# Patient Record
Sex: Female | Born: 1950
Health system: Southern US, Community
[De-identification: ages and names within clinical notes are randomized; demographics above are authoritative.]

## PROBLEM LIST (undated history)

## (undated) DIAGNOSIS — E7800 Pure hypercholesterolemia, unspecified: Secondary | ICD-10-CM

## (undated) DIAGNOSIS — G4730 Sleep apnea, unspecified: Secondary | ICD-10-CM

## (undated) DIAGNOSIS — Z803 Family history of malignant neoplasm of breast: Secondary | ICD-10-CM

## (undated) DIAGNOSIS — K2970 Gastritis, unspecified, without bleeding: Secondary | ICD-10-CM

## (undated) DIAGNOSIS — K209 Esophagitis, unspecified without bleeding: Secondary | ICD-10-CM

## (undated) DIAGNOSIS — K219 Gastro-esophageal reflux disease without esophagitis: Secondary | ICD-10-CM

## (undated) DIAGNOSIS — E559 Vitamin D deficiency, unspecified: Secondary | ICD-10-CM

## (undated) DIAGNOSIS — F32A Depression, unspecified: Secondary | ICD-10-CM

## (undated) DIAGNOSIS — Z800 Family history of malignant neoplasm of digestive organs: Secondary | ICD-10-CM

## (undated) DIAGNOSIS — K5909 Other constipation: Secondary | ICD-10-CM

## (undated) DIAGNOSIS — M1990 Unspecified osteoarthritis, unspecified site: Secondary | ICD-10-CM

## (undated) HISTORY — DX: Esophagitis, unspecified without bleeding: K20.90

## (undated) HISTORY — DX: Vitamin D deficiency, unspecified: E55.9

## (undated) HISTORY — DX: Family history of malignant neoplasm of digestive organs: Z80.0

## (undated) HISTORY — DX: Esophagitis, unspecified: K20.9

## (undated) HISTORY — DX: Unspecified osteoarthritis, unspecified site: M19.90

## (undated) HISTORY — DX: Gastritis, unspecified, without bleeding: K29.70

## (undated) HISTORY — DX: Family history of malignant neoplasm of breast: Z80.3

## (undated) HISTORY — DX: Gastro-esophageal reflux disease without esophagitis: K21.9

## (undated) HISTORY — DX: Sleep apnea, unspecified: G47.30

## (undated) HISTORY — DX: Pure hypercholesterolemia, unspecified: E78.00

## (undated) HISTORY — DX: Depression, unspecified: F32.A

## (undated) HISTORY — DX: Other constipation: K59.09

---

## 1974-03-28 HISTORY — PX: APPENDECTOMY: SHX54

## 1997-09-23 ENCOUNTER — Other Ambulatory Visit: Admission: RE | Admit: 1997-09-23 | Discharge: 1997-09-23 | Payer: Self-pay | Admitting: Obstetrics and Gynecology

## 1998-11-04 ENCOUNTER — Other Ambulatory Visit: Admission: RE | Admit: 1998-11-04 | Discharge: 1998-11-04 | Payer: Self-pay | Admitting: Obstetrics and Gynecology

## 1998-11-04 ENCOUNTER — Encounter (INDEPENDENT_AMBULATORY_CARE_PROVIDER_SITE_OTHER): Payer: Self-pay | Admitting: Specialist

## 1999-12-24 ENCOUNTER — Other Ambulatory Visit: Admission: RE | Admit: 1999-12-24 | Discharge: 1999-12-24 | Payer: Self-pay | Admitting: Otolaryngology

## 2001-03-19 ENCOUNTER — Other Ambulatory Visit: Admission: RE | Admit: 2001-03-19 | Discharge: 2001-03-19 | Payer: Self-pay | Admitting: Obstetrics and Gynecology

## 2002-03-29 ENCOUNTER — Other Ambulatory Visit: Admission: RE | Admit: 2002-03-29 | Discharge: 2002-03-29 | Payer: Self-pay | Admitting: Obstetrics and Gynecology

## 2003-05-07 ENCOUNTER — Other Ambulatory Visit: Admission: RE | Admit: 2003-05-07 | Discharge: 2003-05-07 | Payer: Self-pay | Admitting: Obstetrics and Gynecology

## 2003-10-29 ENCOUNTER — Other Ambulatory Visit: Payer: Self-pay

## 2004-07-24 ENCOUNTER — Ambulatory Visit: Payer: Self-pay | Admitting: Unknown Physician Specialty

## 2004-08-20 ENCOUNTER — Other Ambulatory Visit: Admission: RE | Admit: 2004-08-20 | Discharge: 2004-08-20 | Payer: Self-pay | Admitting: Obstetrics and Gynecology

## 2005-08-24 ENCOUNTER — Other Ambulatory Visit: Admission: RE | Admit: 2005-08-24 | Discharge: 2005-08-24 | Payer: Self-pay | Admitting: Obstetrics & Gynecology

## 2005-12-14 ENCOUNTER — Other Ambulatory Visit: Admission: RE | Admit: 2005-12-14 | Discharge: 2005-12-14 | Payer: Self-pay | Admitting: Obstetrics & Gynecology

## 2006-04-19 ENCOUNTER — Other Ambulatory Visit: Admission: RE | Admit: 2006-04-19 | Discharge: 2006-04-19 | Payer: Self-pay | Admitting: Obstetrics & Gynecology

## 2006-07-20 ENCOUNTER — Other Ambulatory Visit: Admission: RE | Admit: 2006-07-20 | Discharge: 2006-07-20 | Payer: Self-pay | Admitting: Obstetrics & Gynecology

## 2006-12-26 ENCOUNTER — Ambulatory Visit: Payer: Self-pay | Admitting: Rheumatology

## 2007-07-23 ENCOUNTER — Other Ambulatory Visit: Admission: RE | Admit: 2007-07-23 | Discharge: 2007-07-23 | Payer: Self-pay | Admitting: Obstetrics and Gynecology

## 2008-07-25 ENCOUNTER — Other Ambulatory Visit: Admission: RE | Admit: 2008-07-25 | Discharge: 2008-07-25 | Payer: Self-pay | Admitting: Obstetrics and Gynecology

## 2009-02-13 ENCOUNTER — Ambulatory Visit: Payer: Self-pay | Admitting: Unknown Physician Specialty

## 2009-02-13 LAB — HM COLONOSCOPY

## 2010-08-10 ENCOUNTER — Emergency Department (HOSPITAL_COMMUNITY)
Admission: EM | Admit: 2010-08-10 | Discharge: 2010-08-10 | Disposition: A | Discharge status: Home / Self Care | Payer: BC Managed Care – PPO | Attending: Emergency Medicine | Admitting: Emergency Medicine

## 2010-08-10 DIAGNOSIS — E785 Hyperlipidemia, unspecified: Secondary | ICD-10-CM | POA: Insufficient documentation

## 2010-08-10 DIAGNOSIS — R55 Syncope and collapse: Secondary | ICD-10-CM | POA: Insufficient documentation

## 2010-08-10 DIAGNOSIS — R1032 Left lower quadrant pain: Secondary | ICD-10-CM | POA: Insufficient documentation

## 2010-08-10 DIAGNOSIS — R112 Nausea with vomiting, unspecified: Secondary | ICD-10-CM | POA: Insufficient documentation

## 2010-08-10 LAB — CBC
HCT: 37.1 % (ref 36.0–46.0)
Hemoglobin: 12.5 g/dL (ref 12.0–15.0)
MCH: 29.2 pg (ref 26.0–34.0)
MCHC: 33.7 g/dL (ref 30.0–36.0)
MCV: 86.7 fL (ref 78.0–100.0)
Platelets: 242 10*3/uL (ref 150–400)
RBC: 4.28 MIL/uL (ref 3.87–5.11)
RDW: 13.3 % (ref 11.5–15.5)
WBC: 11.7 10*3/uL — ABNORMAL HIGH (ref 4.0–10.5)

## 2010-08-10 LAB — DIFFERENTIAL
Basophils Absolute: 0 10*3/uL (ref 0.0–0.1)
Basophils Relative: 0 % (ref 0–1)
Eosinophils Absolute: 0.1 10*3/uL (ref 0.0–0.7)
Eosinophils Relative: 1 % (ref 0–5)
Lymphocytes Relative: 15 % (ref 12–46)
Lymphs Abs: 1.8 10*3/uL (ref 0.7–4.0)
Monocytes Absolute: 0.7 10*3/uL (ref 0.1–1.0)
Monocytes Relative: 6 % (ref 3–12)
Neutro Abs: 9.1 10*3/uL — ABNORMAL HIGH (ref 1.7–7.7)
Neutrophils Relative %: 78 % — ABNORMAL HIGH (ref 43–77)

## 2010-08-10 LAB — URINALYSIS, ROUTINE W REFLEX MICROSCOPIC
Bilirubin Urine: NEGATIVE
Glucose, UA: NEGATIVE mg/dL
Ketones, ur: NEGATIVE mg/dL
Nitrite: NEGATIVE
Protein, ur: NEGATIVE mg/dL
Specific Gravity, Urine: 1.005 (ref 1.005–1.030)
Urobilinogen, UA: 0.2 mg/dL (ref 0.0–1.0)
pH: 6.5 (ref 5.0–8.0)

## 2010-08-10 LAB — COMPREHENSIVE METABOLIC PANEL
ALT: 25 U/L (ref 0–35)
AST: 24 U/L (ref 0–37)
Albumin: 4.1 g/dL (ref 3.5–5.2)
Alkaline Phosphatase: 46 U/L (ref 39–117)
BUN: 19 mg/dL (ref 6–23)
CO2: 26 mEq/L (ref 19–32)
Calcium: 10 mg/dL (ref 8.4–10.5)
Chloride: 105 mEq/L (ref 96–112)
Creatinine, Ser: 0.78 mg/dL (ref 0.4–1.2)
GFR calc Af Amer: >60 mL/min (ref >60)
GFR calc non Af Amer: >60 mL/min (ref >60)
Glucose, Bld: 115 mg/dL — ABNORMAL HIGH (ref 70–99)
Potassium: 4.1 mEq/L (ref 3.5–5.1)
Sodium: 140 mEq/L (ref 135–145)
Total Bilirubin: 0.3 mg/dL (ref 0.3–1.2)
Total Protein: 7 g/dL (ref 6.0–8.3)

## 2010-08-10 LAB — POCT CARDIAC MARKERS
CKMB, poc: <1 ng/mL — ABNORMAL LOW (ref 1.0–8.0)
Myoglobin, poc: 77.1 ng/mL (ref 12–200)
Troponin i, poc: <0.05 ng/mL (ref 0.00–0.09)

## 2010-08-10 LAB — URINE MICROSCOPIC-ADD ON

## 2010-08-10 LAB — GLUCOSE, CAPILLARY: Glucose-Capillary: 123 mg/dL — ABNORMAL HIGH (ref 70–99)

## 2010-08-18 ENCOUNTER — Ambulatory Visit: Payer: Self-pay | Admitting: Internal Medicine

## 2010-08-18 LAB — URINE CULTURE
Colony Count: 5000
Culture  Setup Time: 201205161808

## 2011-02-08 ENCOUNTER — Ambulatory Visit: Payer: Self-pay | Admitting: Internal Medicine

## 2011-05-14 ENCOUNTER — Emergency Department: Payer: Self-pay | Admitting: Emergency Medicine

## 2011-05-14 LAB — COMPREHENSIVE METABOLIC PANEL
Albumin: 4.9 g/dL (ref 3.4–5.0)
Alkaline Phosphatase: 44 U/L — ABNORMAL LOW (ref 50–136)
Anion Gap: 14 (ref 7–16)
BUN: 20 mg/dL — ABNORMAL HIGH (ref 7–18)
Bilirubin,Total: 0.3 mg/dL (ref 0.2–1.0)
Calcium, Total: 9.7 mg/dL (ref 8.5–10.1)
Chloride: 104 mmol/L (ref 98–107)
Co2: 22 mmol/L (ref 21–32)
Creatinine: 0.97 mg/dL (ref 0.60–1.30)
EGFR (African American): >60
EGFR (Non-African Amer.): >60
Glucose: 141 mg/dL — ABNORMAL HIGH (ref 65–99)
Osmolality: 284 (ref 275–301)
Potassium: 3.8 mmol/L (ref 3.5–5.1)
SGOT(AST): 42 U/L — ABNORMAL HIGH (ref 15–37)
SGPT (ALT): 48 U/L
Sodium: 140 mmol/L (ref 136–145)
Total Protein: 8.2 g/dL (ref 6.4–8.2)

## 2011-05-14 LAB — CBC WITH DIFFERENTIAL/PLATELET
Basophil #: 0 10*3/uL (ref 0.0–0.1)
Basophil %: 0.1 %
Eosinophil #: 0.1 10*3/uL (ref 0.0–0.7)
Eosinophil %: 0.5 %
HCT: 42.2 % (ref 35.0–47.0)
HGB: 14.2 g/dL (ref 12.0–16.0)
Lymphocyte #: 2.7 10*3/uL (ref 1.0–3.6)
Lymphocyte %: 15.9 %
MCH: 30 pg (ref 26.0–34.0)
MCHC: 33.5 g/dL (ref 32.0–36.0)
MCV: 90 fL (ref 80–100)
Monocyte #: 0.3 10*3/uL (ref 0.0–0.7)
Monocyte %: 1.7 %
Neutrophil #: 13.9 10*3/uL — ABNORMAL HIGH (ref 1.4–6.5)
Neutrophil %: 81.8 %
Platelet: 235 10*3/uL (ref 150–440)
RBC: 4.71 10*6/uL (ref 3.80–5.20)
RDW: 13.2 % (ref 11.5–14.5)
WBC: 17 10*3/uL — ABNORMAL HIGH (ref 3.6–11.0)

## 2011-05-14 LAB — TROPONIN I: Troponin-I: <0.02 ng/mL

## 2011-09-14 ENCOUNTER — Ambulatory Visit: Payer: Self-pay | Admitting: Internal Medicine

## 2011-11-23 ENCOUNTER — Ambulatory Visit: Payer: Self-pay | Admitting: Unknown Physician Specialty

## 2011-11-23 LAB — CREATININE, SERUM
Creatinine: 0.73 mg/dL (ref 0.60–1.30)
EGFR (African American): >60
EGFR (Non-African Amer.): >60

## 2011-11-30 ENCOUNTER — Ambulatory Visit: Payer: Self-pay | Admitting: Unknown Physician Specialty

## 2011-12-13 ENCOUNTER — Ambulatory Visit: Payer: Self-pay | Admitting: Unknown Physician Specialty

## 2011-12-14 LAB — PATHOLOGY REPORT

## 2011-12-29 ENCOUNTER — Ambulatory Visit: Payer: Self-pay | Admitting: Surgery

## 2011-12-30 LAB — PATHOLOGY REPORT

## 2012-03-01 ENCOUNTER — Other Ambulatory Visit: Payer: Self-pay | Admitting: Internal Medicine

## 2012-03-01 NOTE — Telephone Encounter (Signed)
Pt is needing her Serteraline and she uses [facility].

## 2012-03-01 NOTE — Telephone Encounter (Signed)
She has an appt with me in 3/14.  I am ok to refill her sertraline until her appt , but will need to clarify dose with pt before filling.  I do not have her records here yet.

## 2012-03-02 MED ORDER — SERTRALINE HCL 100 MG PO TABS: 100.0000 mg | ORAL_TABLET | Freq: Every day | ORAL | Status: DC

## 2012-03-02 NOTE — Telephone Encounter (Signed)
Called prescription in to pharmacy 

## 2012-03-15 ENCOUNTER — Ambulatory Visit: Payer: Self-pay | Admitting: Internal Medicine

## 2012-03-19 ENCOUNTER — Encounter: Payer: Self-pay | Admitting: Internal Medicine

## 2012-03-19 DIAGNOSIS — E7800 Pure hypercholesterolemia, unspecified: Secondary | ICD-10-CM

## 2012-03-19 DIAGNOSIS — K219 Gastro-esophageal reflux disease without esophagitis: Secondary | ICD-10-CM

## 2012-04-04 ENCOUNTER — Encounter: Payer: Self-pay | Admitting: Internal Medicine

## 2012-06-03 ENCOUNTER — Encounter: Payer: Self-pay | Admitting: Internal Medicine

## 2012-06-03 DIAGNOSIS — E7800 Pure hypercholesterolemia, unspecified: Secondary | ICD-10-CM | POA: Insufficient documentation

## 2012-06-03 DIAGNOSIS — K219 Gastro-esophageal reflux disease without esophagitis: Secondary | ICD-10-CM | POA: Insufficient documentation

## 2012-06-04 ENCOUNTER — Ambulatory Visit (INDEPENDENT_AMBULATORY_CARE_PROVIDER_SITE_OTHER): Payer: BC Managed Care – PPO | Admitting: Internal Medicine

## 2012-06-04 ENCOUNTER — Encounter: Payer: Self-pay | Admitting: Internal Medicine

## 2012-06-04 ENCOUNTER — Other Ambulatory Visit (HOSPITAL_COMMUNITY)
Admission: RE | Admit: 2012-06-04 | Discharge: 2012-06-04 | Disposition: A | Discharge status: Home / Self Care | Payer: BC Managed Care – PPO | Source: Ambulatory Visit | Attending: Internal Medicine | Admitting: Internal Medicine

## 2012-06-04 VITALS — BP 110/60 | HR 75 | Temp 98.4°F | Ht 60.15 in | Wt 139.0 lb

## 2012-06-04 DIAGNOSIS — Z01419 Encounter for gynecological examination (general) (routine) without abnormal findings: Secondary | ICD-10-CM | POA: Insufficient documentation

## 2012-06-04 DIAGNOSIS — Z124 Encounter for screening for malignant neoplasm of cervix: Secondary | ICD-10-CM

## 2012-06-04 DIAGNOSIS — Z1151 Encounter for screening for human papillomavirus (HPV): Secondary | ICD-10-CM | POA: Insufficient documentation

## 2012-06-04 DIAGNOSIS — K209 Esophagitis, unspecified without bleeding: Secondary | ICD-10-CM

## 2012-06-04 DIAGNOSIS — K219 Gastro-esophageal reflux disease without esophagitis: Secondary | ICD-10-CM

## 2012-06-04 DIAGNOSIS — E7800 Pure hypercholesterolemia, unspecified: Secondary | ICD-10-CM

## 2012-06-04 DIAGNOSIS — G4733 Obstructive sleep apnea (adult) (pediatric): Secondary | ICD-10-CM

## 2012-06-04 LAB — HM PAP SMEAR: HM Pap smear: NEGATIVE

## 2012-06-05 ENCOUNTER — Encounter: Payer: Self-pay | Admitting: Internal Medicine

## 2012-06-05 DIAGNOSIS — K209 Esophagitis, unspecified without bleeding: Secondary | ICD-10-CM | POA: Insufficient documentation

## 2012-06-05 DIAGNOSIS — G4733 Obstructive sleep apnea (adult) (pediatric): Secondary | ICD-10-CM | POA: Insufficient documentation

## 2012-06-05 NOTE — Assessment & Plan Note (Signed)
On crestor.  Low cholesterol diet and exercise.  Check lipid panel and liver function.  

## 2012-06-05 NOTE — Assessment & Plan Note (Signed)
Using CPAP.  Doing well.  Follow.   

## 2012-06-05 NOTE — Progress Notes (Signed)
Subjective:    Patient ID: Jessica Peck, female    DOB: 04-12-1950, 62 y.o.   MRN: [id number]  HPI 62 year old female with past history of hypercholesterolemia and esophagitis who comes in today for a scheduled follow up and to transfer her care here to [facility].  She is also here for a complete physical exam.  She states she has been doing better.  Using CPAP now and doing well with this.  Had her gallbladder removed - 10/13.  No abdominal pain or cramping.  On prevacid and this is controlling acid reflux.  Bowels stable.  Has changed jobs.  Handling stress well.  Overall she feels good and feels she is doing well.  Bowels stable.   Past Medical History  Diagnosis Date  . Hypercholesterolemia   . Esophagitis   . Gastritis     EGD (hiatal hernia)  . Chronic constipation   . GERD (gastroesophageal reflux disease)   . Vitamin D deficiency   . Sleep apnea     CPAP    Current Outpatient Prescriptions on File Prior to Visit  Medication Sig Dispense Refill  . lansoprazole (PREVACID) 30 MG capsule Take 30 mg by mouth daily.      . rosuvastatin (CRESTOR) 5 MG tablet 1/2 tablet q day      . sertraline (ZOLOFT) 100 MG tablet Take 1 tablet (100 mg total) by mouth daily.  90 tablet  0   No current facility-administered medications on file prior to visit.    Review of Systems Patient denies any headache, lightheadedness or dizziness.  No sinus or allergy symptoms.  No chest pain, tightness or palpitations.  No increased shortness of breath, cough or congestion.  No nausea or vomiting. Acid reflux controlled with prevacid.  No abdominal pain or cramping.  No bowel change, such as diarrhea, constipation, BRBPR or melana.  No urine change.  Handling stress well.  Feels good.  Has a nasal lesion.      Objective:   Physical Exam Filed Vitals:   06/04/12 1002  BP: 110/60  Pulse: 75  Temp: 98.4 F (36.9 C)   Pulse: 59  62 year old female in no acute distress.   HEENT:  Nares- clear.   Oropharynx - without lesions. NECK:  Supple.  Nontender.  No audible bruit.  HEART:  Appears to be regular. LUNGS:  No crackles or wheezing audible.  Respirations even and unlabored.  RADIAL PULSE:  Equal bilaterally.    BREASTS:  No nipple discharge or nipple retraction present.  Could not appreciate any distinct nodules or axillary adenopathy.  ABDOMEN:  Soft, nontender.  Bowel sounds present and normal.  No audible abdominal bruit.  GU:  Normal external genitalia.  Vaginal vault without lesions.  Cervix identified.  Pap performed. Could not appreciate any adnexal masses or tenderness.   RECTAL:  Heme negative.   EXTREMITIES:  No increased edema present.  DP pulses palpable and equal bilaterally.          Assessment & Plan:  NASAL LESION.  Could not appreciate a significant lesion today.  Bactroban cream as directed.  Follow.    CARDIOVASCULAR.  Had a stress myoview 12/01/09.  ER 68% with no ischemia.  ECHO - EF 55% with mild AI.  Currently asymptomatic.  Continue risk factor modification.   INCREASED PSYCHOSOCIAL STRESSORS.  Doing well on Zoloft.  Follow.   PULMONARY.  CXR 06/21/10 - no acute cardiopulmonary disease.  Asymptomatic.  Using CPAP.   HEALTH  MAINTENANCE.  Physical today.  Pap today.  Obtain mammogram results.  Colonoscopy 02/13/09 - internal hemorrhoids.

## 2012-06-05 NOTE — Assessment & Plan Note (Signed)
Symptoms controlled on prevacid.  Follow.   

## 2012-06-05 NOTE — Assessment & Plan Note (Signed)
Noted on EGD.  On prevacid.  Symptoms controlled.   

## 2012-06-06 ENCOUNTER — Encounter: Payer: Self-pay | Admitting: Internal Medicine

## 2012-07-02 ENCOUNTER — Other Ambulatory Visit: Payer: Self-pay | Admitting: *Deleted

## 2012-07-02 MED ORDER — SERTRALINE HCL 100 MG PO TABS: 100.0000 mg | ORAL_TABLET | Freq: Every day | ORAL | Status: DC

## 2012-07-05 NOTE — Telephone Encounter (Signed)
Please close encounter

## 2012-11-28 ENCOUNTER — Other Ambulatory Visit (INDEPENDENT_AMBULATORY_CARE_PROVIDER_SITE_OTHER): Payer: BC Managed Care – PPO

## 2012-11-28 DIAGNOSIS — G4733 Obstructive sleep apnea (adult) (pediatric): Secondary | ICD-10-CM

## 2012-11-28 DIAGNOSIS — E7800 Pure hypercholesterolemia, unspecified: Secondary | ICD-10-CM

## 2012-11-28 LAB — LIPID PANEL
Cholesterol: 213 mg/dL — ABNORMAL HIGH (ref 0–200)
HDL: 58.6 mg/dL (ref >39.00)
Total CHOL/HDL Ratio: 4
Triglycerides: 190 mg/dL — ABNORMAL HIGH (ref 0.0–149.0)
VLDL: 38 mg/dL (ref 0.0–40.0)

## 2012-11-28 LAB — CBC WITH DIFFERENTIAL/PLATELET
Basophils Absolute: 0 10*3/uL (ref 0.0–0.1)
Basophils Relative: 0.6 % (ref 0.0–3.0)
Eosinophils Absolute: 0.2 10*3/uL (ref 0.0–0.7)
Eosinophils Relative: 3.9 % (ref 0.0–5.0)
HCT: 37.2 % (ref 36.0–46.0)
Hemoglobin: 12.8 g/dL (ref 12.0–15.0)
Lymphocytes Relative: 48.9 % — ABNORMAL HIGH (ref 12.0–46.0)
Lymphs Abs: 2.5 10*3/uL (ref 0.7–4.0)
MCHC: 34.3 g/dL (ref 30.0–36.0)
MCV: 88.2 fl (ref 78.0–100.0)
Monocytes Absolute: 0.2 10*3/uL (ref 0.1–1.0)
Monocytes Relative: 4.7 % (ref 3.0–12.0)
Neutro Abs: 2.1 10*3/uL (ref 1.4–7.7)
Neutrophils Relative %: 41.9 % — ABNORMAL LOW (ref 43.0–77.0)
Platelets: 239 10*3/uL (ref 150.0–400.0)
RBC: 4.22 Mil/uL (ref 3.87–5.11)
RDW: 13.7 % (ref 11.5–14.6)
WBC: 5.1 10*3/uL (ref 4.5–10.5)

## 2012-11-28 LAB — COMPREHENSIVE METABOLIC PANEL
ALT: 22 U/L (ref 0–35)
AST: 23 U/L (ref 0–37)
Albumin: 4.4 g/dL (ref 3.5–5.2)
Alkaline Phosphatase: 40 U/L (ref 39–117)
BUN: 18 mg/dL (ref 6–23)
CO2: 24 mEq/L (ref 19–32)
Calcium: 9.4 mg/dL (ref 8.4–10.5)
Chloride: 108 mEq/L (ref 96–112)
Creatinine, Ser: 0.8 mg/dL (ref 0.4–1.2)
GFR: 74.32 mL/min (ref >60.00)
Glucose, Bld: 110 mg/dL — ABNORMAL HIGH (ref 70–99)
Potassium: 4.3 mEq/L (ref 3.5–5.1)
Sodium: 138 mEq/L (ref 135–145)
Total Bilirubin: 0.5 mg/dL (ref 0.3–1.2)
Total Protein: 7 g/dL (ref 6.0–8.3)

## 2012-11-28 LAB — LDL CHOLESTEROL, DIRECT: Direct LDL: 135.3 mg/dL

## 2012-11-28 LAB — TSH: TSH: 2.56 u[IU]/mL (ref 0.35–5.50)

## 2012-12-07 ENCOUNTER — Ambulatory Visit: Payer: BC Managed Care – PPO | Admitting: Internal Medicine

## 2012-12-10 ENCOUNTER — Encounter: Payer: Self-pay | Admitting: Internal Medicine

## 2012-12-10 ENCOUNTER — Ambulatory Visit (INDEPENDENT_AMBULATORY_CARE_PROVIDER_SITE_OTHER): Payer: BC Managed Care – PPO | Admitting: Internal Medicine

## 2012-12-10 VITALS — BP 120/80 | HR 73 | Temp 98.5°F | Ht 60.15 in | Wt 142.2 lb

## 2012-12-10 DIAGNOSIS — E7800 Pure hypercholesterolemia, unspecified: Secondary | ICD-10-CM

## 2012-12-10 DIAGNOSIS — K209 Esophagitis, unspecified without bleeding: Secondary | ICD-10-CM

## 2012-12-10 DIAGNOSIS — K219 Gastro-esophageal reflux disease without esophagitis: Secondary | ICD-10-CM

## 2012-12-10 DIAGNOSIS — G4733 Obstructive sleep apnea (adult) (pediatric): Secondary | ICD-10-CM

## 2012-12-10 DIAGNOSIS — Z0000 Encounter for general adult medical examination without abnormal findings: Secondary | ICD-10-CM

## 2012-12-10 MED ORDER — ESTROGENS, CONJUGATED 0.625 MG/GM VA CREA: TOPICAL_CREAM | VAGINAL | Status: DC | PRN

## 2012-12-10 NOTE — Patient Instructions (Addendum)
Ranitidine 150mg  - take before bed.    Jessica Peck - [phone]

## 2012-12-11 ENCOUNTER — Encounter: Payer: Self-pay | Admitting: Internal Medicine

## 2012-12-11 NOTE — Assessment & Plan Note (Signed)
Using CPAP.  Doing well.  Follow.   

## 2012-12-11 NOTE — Progress Notes (Signed)
  Subjective:    Patient ID: Jessica Peck, female    DOB: 22-Feb-1951, 62 y.o.   MRN: [id number]  HPI 62 year old female with past history of hypercholesterolemia and esophagitis who comes in today for a scheduled follow up.  She states she has been doing better.   Had her gallbladder removed - 10/13.  No abdominal pain or cramping.  On prevacid and this is controlling acid reflux reasonably well.  Minimal symptoms.  Bowels stable.  Has changed jobs.  Handling stress well.  Overall she feels good and feels she is doing well.  She did report she may want to pursue some counseling in the future.  Bowels stable.  She did stop her crestor two weeks ago.  Had no problems with the medication, just did not want to take it.  Wants to try diet and exercise.    Past Medical History  Diagnosis Date  . Hypercholesterolemia   . Esophagitis   . Gastritis     EGD (hiatal hernia)  . Chronic constipation   . GERD (gastroesophageal reflux disease)   . Vitamin D deficiency   . Sleep apnea     CPAP    Current Outpatient Prescriptions on File Prior to Visit  Medication Sig Dispense Refill  . lansoprazole (PREVACID) 30 MG capsule Take 30 mg by mouth daily.      . sertraline (ZOLOFT) 100 MG tablet Take 1 tablet (100 mg total) by mouth daily.  30 tablet  3  . Vitamin D, Ergocalciferol, (DRISDOL) 50000 UNITS CAPS Take 50,000 Units by mouth daily.       No current facility-administered medications on file prior to visit.    Review of Systems Patient denies any headache, lightheadedness or dizziness.  No sinus or allergy symptoms.  No chest pain, tightness or palpitations.  No increased shortness of breath, cough or congestion.  No nausea or vomiting. Acid reflux controlled reasonably well with prevacid.  Minimal symptoms at times.  No abdominal pain or cramping.  No bowel change, such as diarrhea, constipation, BRBPR or melana.  No urine change.  Handling stress well.  Feels good.  Off crestor.        Objective:   Physical Exam  Filed Vitals:   12/10/12 0935  BP: 120/80  Pulse: 73  Temp: 98.5 F (36.9 C)   Pulse: 15  62 year old female in no acute distress.   HEENT:  Nares- clear.  Oropharynx - without lesions. NECK:  Supple.  Nontender.  No audible bruit.  HEART:  Appears to be regular. LUNGS:  No crackles or wheezing audible.  Respirations even and unlabored.  RADIAL PULSE:  Equal bilaterally.   ABDOMEN:  Soft, nontender.  Bowel sounds present and normal.  No audible abdominal bruit.    EXTREMITIES:  No increased edema present.  DP pulses palpable and equal bilaterally.          Assessment & Plan:   CARDIOVASCULAR.  Had a stress myoview 12/01/09.  ER 68% with no ischemia.  ECHO - EF 55% with mild AI.  Currently asymptomatic.  Continue risk factor modification.   INCREASED PSYCHOSOCIAL STRESSORS.  Doing well on Zoloft.  Follow.  Did give her [facility] phone number if desires counseling.    PULMONARY.  CXR 06/21/10 - no acute cardiopulmonary disease.  Asymptomatic.  Using CPAP.   HEALTH MAINTENANCE.  Physical 06/04/12.  Pap - ok.  Mammogram 03/15/12 - Birads II. Colonoscopy 02/13/09 - internal hemorrhoids.

## 2012-12-11 NOTE — Assessment & Plan Note (Signed)
Noted on EGD.  On prevacid.  Symptoms controlled.   

## 2012-12-11 NOTE — Assessment & Plan Note (Signed)
Off crestor.  Low cholesterol diet and exercise.  Lipid panel just checked and revealed slightly increased triglycerides.  [place] diet given.  Wants to remain off crestor.  Wants to work on diet and exercise.  Will follow.

## 2012-12-11 NOTE — Assessment & Plan Note (Addendum)
Symptoms relatively well controlled on prevacid.  Some minimal symptoms at times.  Add Zantac before her evening meal.  Follow.

## 2012-12-24 ENCOUNTER — Encounter: Payer: Self-pay | Admitting: Internal Medicine

## 2012-12-26 ENCOUNTER — Encounter: Payer: Self-pay | Admitting: Internal Medicine

## 2013-02-20 ENCOUNTER — Encounter (INDEPENDENT_AMBULATORY_CARE_PROVIDER_SITE_OTHER): Payer: Self-pay

## 2013-02-20 ENCOUNTER — Ambulatory Visit (INDEPENDENT_AMBULATORY_CARE_PROVIDER_SITE_OTHER): Payer: BC Managed Care – PPO | Admitting: Adult Health

## 2013-02-20 ENCOUNTER — Encounter: Payer: Self-pay | Admitting: Adult Health

## 2013-02-20 VITALS — BP 128/80 | HR 77 | Temp 97.8°F | Resp 14 | Wt 144.0 lb

## 2013-02-20 DIAGNOSIS — S060X0D Concussion without loss of consciousness, subsequent encounter: Secondary | ICD-10-CM

## 2013-02-20 DIAGNOSIS — S060X0A Concussion without loss of consciousness, initial encounter: Secondary | ICD-10-CM

## 2013-02-20 NOTE — Assessment & Plan Note (Signed)
Followup visit following a fall 2 weeks ago with concussion without loss of consciousness. 2 staples required. CT done normal reported by pt. Normal exam. Symptoms expected following concussion. If visual disturbance, severe headache or other neuro deficits develop then patient was instructed to call immediately. Follow up with PCP in 1 week.

## 2013-02-20 NOTE — Progress Notes (Signed)
Pre visit review using our clinic review tool, if applicable. No additional management support is needed unless otherwise documented below in the visit note. 

## 2013-02-20 NOTE — Progress Notes (Signed)
   Subjective:    Patient ID: Jessica Peck, female    DOB: Jul 13, 1950, 62 y.o.   MRN: [id number]  HPI  Is a very pleasant 62 year old female who presents to clinic status post fall 2 weeks ago. She reports that she slipped on a hardwood floor and fell and hit the back of her head. She required 2 staples. The staples have been removed. She was instructed to take Tylenol or ibuprofen. Instructed not to take aspirin. She was also instructed to rest and not do any strenuous activity. Patient reports that she was helping to move heavy furniture the Monday after the fall. Patient reports that she still has "weird" sensations in her head. Denies severe headache, blurred vision, dizziness. She reports feeling lethargic at times. Patient was given instructions in the hospital that depicted what to expect following a concussion. She is aware that she could have mild headaches, memory changes lasting approximately a couple of months.  Current Outpatient Prescriptions on File Prior to Visit  Medication Sig Dispense Refill  . conjugated estrogens (PREMARIN) vaginal cream Place vaginally as needed.  42.5 g  2  . lansoprazole (PREVACID) 30 MG capsule Take 30 mg by mouth daily.      . sertraline (ZOLOFT) 100 MG tablet Take 1 tablet (100 mg total) by mouth daily.  30 tablet  3  . Vitamin D, Ergocalciferol, (DRISDOL) 50000 UNITS CAPS Take 50,000 Units by mouth daily.       No current facility-administered medications on file prior to visit.    Review of Systems  Constitutional: Negative.   HENT: Negative for congestion and facial swelling.   Neurological: Positive for headaches. Negative for dizziness, tremors, weakness and numbness.       Tingling sensation occasionally on her head  Psychiatric/Behavioral: Negative.        Objective:   Physical Exam  Constitutional: She is oriented to person, place, and time. She appears well-developed and well-nourished. No distress.  Pulmonary/Chest: Effort  normal. No respiratory distress.  Musculoskeletal: Normal range of motion.  Good strength bilaterally, upper and lower extremities  Neurological: She is alert and oriented to person, place, and time. No cranial nerve deficit. Coordination normal.  Neuro exam normal.  Psychiatric: She has a normal mood and affect. Her behavior is normal. Judgment and thought content normal.          Assessment & Plan:

## 2013-03-04 ENCOUNTER — Encounter: Payer: Self-pay | Admitting: Internal Medicine

## 2013-03-04 ENCOUNTER — Ambulatory Visit (INDEPENDENT_AMBULATORY_CARE_PROVIDER_SITE_OTHER): Payer: BC Managed Care – PPO | Admitting: Internal Medicine

## 2013-03-04 VITALS — BP 112/80 | HR 71 | Temp 97.9°F | Ht 60.15 in | Wt 143.2 lb

## 2013-03-04 DIAGNOSIS — G4733 Obstructive sleep apnea (adult) (pediatric): Secondary | ICD-10-CM

## 2013-03-04 DIAGNOSIS — K209 Esophagitis, unspecified without bleeding: Secondary | ICD-10-CM

## 2013-03-04 DIAGNOSIS — M25519 Pain in unspecified shoulder: Secondary | ICD-10-CM

## 2013-03-04 DIAGNOSIS — Z0000 Encounter for general adult medical examination without abnormal findings: Secondary | ICD-10-CM

## 2013-03-04 DIAGNOSIS — K219 Gastro-esophageal reflux disease without esophagitis: Secondary | ICD-10-CM

## 2013-03-04 DIAGNOSIS — Z5189 Encounter for other specified aftercare: Secondary | ICD-10-CM

## 2013-03-04 DIAGNOSIS — E7800 Pure hypercholesterolemia, unspecified: Secondary | ICD-10-CM

## 2013-03-04 DIAGNOSIS — S060X0D Concussion without loss of consciousness, subsequent encounter: Secondary | ICD-10-CM

## 2013-03-04 NOTE — Progress Notes (Signed)
Pre-visit discussion using our clinic review tool. No additional management support is needed unless otherwise documented below in the visit note.  

## 2013-03-05 LAB — COMPREHENSIVE METABOLIC PANEL WITH GFR
ALT: 28 U/L (ref 0–35)
AST: 25 U/L (ref 0–37)
Albumin: 4.5 g/dL (ref 3.5–5.2)
Alkaline Phosphatase: 43 U/L (ref 39–117)
BUN: 20 mg/dL (ref 6–23)
CO2: 26 meq/L (ref 19–32)
Calcium: 9.6 mg/dL (ref 8.4–10.5)
Chloride: 102 meq/L (ref 96–112)
Creatinine, Ser: 0.7 mg/dL (ref 0.4–1.2)
GFR: 84.77 mL/min
Glucose, Bld: 102 mg/dL — ABNORMAL HIGH (ref 70–99)
Potassium: 3.7 meq/L (ref 3.5–5.1)
Sodium: 136 meq/L (ref 135–145)
Total Bilirubin: 0.4 mg/dL (ref 0.3–1.2)
Total Protein: 7.4 g/dL (ref 6.0–8.3)

## 2013-03-05 LAB — VARICELLA ZOSTER ANTIBODY, IGG: Varicella IgG: 673.9 Index — ABNORMAL HIGH (ref <135.00)

## 2013-03-05 LAB — SEDIMENTATION RATE: Sed Rate: 14 mm/hr (ref 0–22)

## 2013-03-06 ENCOUNTER — Encounter: Payer: Self-pay | Admitting: *Deleted

## 2013-03-09 ENCOUNTER — Encounter: Payer: Self-pay | Admitting: Internal Medicine

## 2013-03-09 DIAGNOSIS — M25519 Pain in unspecified shoulder: Secondary | ICD-10-CM | POA: Insufficient documentation

## 2013-03-09 NOTE — Assessment & Plan Note (Signed)
Low cholesterol diet and exercise.   Will follow.     

## 2013-03-09 NOTE — Assessment & Plan Note (Signed)
Persistent pain.  Wants to see Dr Ernest Pine.  Referral made.  Follow.

## 2013-03-09 NOTE — Progress Notes (Signed)
Subjective:    Patient ID: Jessica Peck, female    DOB: 08-May-1950, 62 y.o.   MRN: [id number]  HPI 62 year old female with past history of hypercholesterolemia and esophagitis who comes in today for a scheduled follow up.  She states she has been doing better.  [place] and hit her head.  Had staples.  Removed.  Saw Raquel for f/u.  See her note for details.  States that for two weeks after her fall, she felt "fuzzy headed".  This has resolved.  The dull headache has resolved.  No dizziness.  Overall feels she is back to normal.  Stomach doing ok.   Had her gallbladder removed - 10/13.  No abdominal pain or cramping.  On prevacid and this is controlling acid reflux reasonably well.  Bowels stable.  Has changed jobs.  Handling stress well.  Overall she feels good and feels she is doing well.  Bowels stable.  She does report some neck stiffness.  Notice some discomfort when she looks to the right.  It pulls the left side of her neck.        Past Medical History  Diagnosis Date  . Hypercholesterolemia   . Esophagitis   . Gastritis     EGD (hiatal hernia)  . Chronic constipation   . GERD (gastroesophageal reflux disease)   . Vitamin D deficiency   . Sleep apnea     CPAP    Current Outpatient Prescriptions on File Prior to Visit  Medication Sig Dispense Refill  . conjugated estrogens (PREMARIN) vaginal cream Place vaginally as needed.  42.5 g  2  . lansoprazole (PREVACID) 30 MG capsule Take 30 mg by mouth daily.      . sertraline (ZOLOFT) 100 MG tablet Take 1 tablet (100 mg total) by mouth daily.  30 tablet  3   No current facility-administered medications on file prior to visit.    Review of Systems Patient denies any headache, lightheadedness or dizziness.  S/p fall.  Feels back to her baseline.  No sinus or allergy symptoms.  No chest pain, tightness or palpitations.  No increased shortness of breath, cough or congestion.  No nausea or vomiting. Acid reflux controlled reasonably well  with prevacid.  No abdominal pain or cramping.  No bowel change, such as diarrhea, constipation, BRBPR or melana.  No urine change.  Handling stress well.  Feels good.  Neck stiffness as outlined.  Has persistent shoulder pain.  Has tried some conservative measures.  Would like to see Dr Ernest Pine.       Objective:   Physical Exam  Filed Vitals:   03/04/13 1531  BP: 112/80  Pulse: 71  Temp: 97.9 F (35.88 C)   62 year old female in no acute distress.   HEENT:  Nares- clear.  Oropharynx - without lesions. NECK:  Supple.  Nontender.  No audible bruit.  Some minimal discomfort left neck when looking to the right.  HEART:  Appears to be regular. LUNGS:  No crackles or wheezing audible.  Respirations even and unlabored.  RADIAL PULSE:  Equal bilaterally.   ABDOMEN:  Soft, nontender.  Bowel sounds present and normal.  No audible abdominal bruit.    EXTREMITIES:  No increased edema present.  DP pulses palpable and equal bilaterally.          Assessment & Plan:   CARDIOVASCULAR.  Had a stress myoview 12/01/09.  ER 68% with no ischemia.  ECHO - EF 55% with mild AI.  Currently  asymptomatic.  Continue risk factor modification.   INCREASED PSYCHOSOCIAL STRESSORS.  Doing well on Zoloft.  Follow.     PULMONARY.  CXR 06/21/10 - no acute cardiopulmonary disease.  Asymptomatic.  Using CPAP.   HEALTH MAINTENANCE.  Physical 06/04/12.  Pap - ok.  Mammogram 03/15/12 - Birads II.  Colonoscopy 02/13/09 - internal hemorrhoids.  Needs f/u mammogram.

## 2013-03-09 NOTE — Assessment & Plan Note (Signed)
Doing better.  Headache resolved.  Overall doing well.  Follow.

## 2013-03-09 NOTE — Assessment & Plan Note (Signed)
Symptoms controlled.  On prevacid.  Follow.   

## 2013-03-09 NOTE — Assessment & Plan Note (Signed)
Noted on EGD.  On prevacid.  Symptoms controlled.   

## 2013-03-09 NOTE — Assessment & Plan Note (Signed)
Using CPAP.  Doing well.  Follow.   

## 2013-04-04 ENCOUNTER — Ambulatory Visit: Payer: Self-pay | Admitting: Internal Medicine

## 2013-04-04 LAB — HM MAMMOGRAPHY: HM Mammogram: NEGATIVE

## 2013-04-08 ENCOUNTER — Encounter: Payer: Self-pay | Admitting: Internal Medicine

## 2013-04-14 ENCOUNTER — Other Ambulatory Visit: Payer: Self-pay | Admitting: Internal Medicine

## 2013-06-11 ENCOUNTER — Encounter: Payer: BC Managed Care – PPO | Admitting: Internal Medicine

## 2013-08-22 ENCOUNTER — Encounter: Payer: Self-pay | Admitting: Internal Medicine

## 2013-08-22 ENCOUNTER — Ambulatory Visit (INDEPENDENT_AMBULATORY_CARE_PROVIDER_SITE_OTHER): Payer: BC Managed Care – PPO | Admitting: Internal Medicine

## 2013-08-22 VITALS — BP 110/72 | HR 73 | Temp 98.1°F | Resp 16 | Ht 60.5 in | Wt 139.5 lb

## 2013-08-22 DIAGNOSIS — R5383 Other fatigue: Secondary | ICD-10-CM

## 2013-08-22 DIAGNOSIS — K209 Esophagitis, unspecified without bleeding: Secondary | ICD-10-CM

## 2013-08-22 DIAGNOSIS — E7800 Pure hypercholesterolemia, unspecified: Secondary | ICD-10-CM

## 2013-08-22 DIAGNOSIS — G4733 Obstructive sleep apnea (adult) (pediatric): Secondary | ICD-10-CM

## 2013-08-22 DIAGNOSIS — K219 Gastro-esophageal reflux disease without esophagitis: Secondary | ICD-10-CM

## 2013-08-22 DIAGNOSIS — M25519 Pain in unspecified shoulder: Secondary | ICD-10-CM

## 2013-08-22 DIAGNOSIS — R5381 Other malaise: Secondary | ICD-10-CM

## 2013-08-22 DIAGNOSIS — R198 Other specified symptoms and signs involving the digestive system and abdomen: Secondary | ICD-10-CM

## 2013-08-22 DIAGNOSIS — L989 Disorder of the skin and subcutaneous tissue, unspecified: Secondary | ICD-10-CM

## 2013-08-22 LAB — COMPREHENSIVE METABOLIC PANEL
ALT: 22 U/L (ref 0–35)
AST: 24 U/L (ref 0–37)
Albumin: 4.5 g/dL (ref 3.5–5.2)
Alkaline Phosphatase: 38 U/L — ABNORMAL LOW (ref 39–117)
BUN: 17 mg/dL (ref 6–23)
CO2: 27 mEq/L (ref 19–32)
Calcium: 9.9 mg/dL (ref 8.4–10.5)
Chloride: 105 mEq/L (ref 96–112)
Creatinine, Ser: 0.7 mg/dL (ref 0.4–1.2)
GFR: 84.64 mL/min (ref >60.00)
Glucose, Bld: 96 mg/dL (ref 70–99)
Potassium: 4.6 mEq/L (ref 3.5–5.1)
Sodium: 139 mEq/L (ref 135–145)
Total Bilirubin: 0.4 mg/dL (ref 0.2–1.2)
Total Protein: 7 g/dL (ref 6.0–8.3)

## 2013-08-22 LAB — CBC WITH DIFFERENTIAL/PLATELET
Basophils Absolute: 0 10*3/uL (ref 0.0–0.1)
Basophils Relative: 0.4 % (ref 0.0–3.0)
Eosinophils Absolute: 0.1 10*3/uL (ref 0.0–0.7)
Eosinophils Relative: 2.9 % (ref 0.0–5.0)
HCT: 38.5 % (ref 36.0–46.0)
Hemoglobin: 13.1 g/dL (ref 12.0–15.0)
Lymphocytes Relative: 52.2 % — ABNORMAL HIGH (ref 12.0–46.0)
Lymphs Abs: 2.4 10*3/uL (ref 0.7–4.0)
MCHC: 34 g/dL (ref 30.0–36.0)
MCV: 88.5 fl (ref 78.0–100.0)
Monocytes Absolute: 0.3 10*3/uL (ref 0.1–1.0)
Monocytes Relative: 6.1 % (ref 3.0–12.0)
Neutro Abs: 1.8 10*3/uL (ref 1.4–7.7)
Neutrophils Relative %: 38.4 % — ABNORMAL LOW (ref 43.0–77.0)
Platelets: 264 10*3/uL (ref 150.0–400.0)
RBC: 4.35 Mil/uL (ref 3.87–5.11)
RDW: 13.6 % (ref 11.5–15.5)
WBC: 4.7 10*3/uL (ref 4.0–10.5)

## 2013-08-22 LAB — LIPID PANEL
Cholesterol: 253 mg/dL — ABNORMAL HIGH (ref 0–200)
HDL: 56.3 mg/dL (ref >39.00)
LDL Cholesterol: 159 mg/dL — ABNORMAL HIGH (ref 0–99)
Total CHOL/HDL Ratio: 4
Triglycerides: 187 mg/dL — ABNORMAL HIGH (ref 0.0–149.0)
VLDL: 37.4 mg/dL (ref 0.0–40.0)

## 2013-08-22 LAB — VITAMIN B12: Vitamin B-12: 261 pg/mL (ref 211–911)

## 2013-08-22 LAB — TSH: TSH: 2.78 u[IU]/mL (ref 0.35–4.50)

## 2013-08-22 NOTE — Progress Notes (Signed)
Pre visit review using our clinic review tool, if applicable. No additional management support is needed unless otherwise documented below in the visit note. 

## 2013-08-22 NOTE — Progress Notes (Signed)
Subjective:    Patient ID: Jessica Peck, female    DOB: 09/08/1950, 63 y.o.   MRN: [id number]  HPI 63 year old female with past history of hypercholesterolemia and esophagitis who comes in today to follow up on these issues as well as for a complete physical exam.  Stomach doing ok.   Had her gallbladder removed - 10/13.  No abdominal pain or cramping.  She does report having some bowel urgency.  Has noticed this over the last few months.  On prevacid and this is controlling acid reflux.  Has changed jobs.  Handling stress well.  Increased fatigue.  No chest pain or tightness with increased activity or exertion.  Breathing stable.  Her shoulder is better.  Injection helped.        Past Medical History  Diagnosis Date  . Hypercholesterolemia   . Esophagitis   . Gastritis     EGD (hiatal hernia)  . Chronic constipation   . GERD (gastroesophageal reflux disease)   . Vitamin D deficiency   . Sleep apnea     CPAP    Current Outpatient Prescriptions on File Prior to Visit  Medication Sig Dispense Refill  . conjugated estrogens (PREMARIN) vaginal cream Place vaginally as needed.  42.5 g  2  . lansoprazole (PREVACID) 30 MG capsule Take 30 mg by mouth daily.      . sertraline (ZOLOFT) 100 MG tablet TAKE 1 TABLET (100 MG TOTAL) BY MOUTH DAILY.  30 tablet  2  . VITAMIN D, CHOLECALCIFEROL, PO Take 4,000 Units by mouth daily.        No current facility-administered medications on file prior to visit.    Review of Systems Patient denies any headache, lightheadedness or dizziness.   No sinus or allergy symptoms.  No chest pain, tightness or palpitations.  No increased shortness of breath, cough or congestion.  No nausea or vomiting.  Acid reflux controlled.  No abdominal pain or cramping.  No bowel change, such as constipation, BRBPR or melana.  Some bowel urgency as outlined.  No urine change.  Handling stress well.  Shoulder better s/p injection.         Objective:   Physical  Exam  Filed Vitals:   08/22/13 0823  BP: 110/72  Pulse: 73  Temp: 98.1 F (36.7 C)  Resp: 3616   63 year old female in no acute distress.   HEENT:  Nares- clear.  Oropharynx - without lesions. NECK:  Supple.  Nontender.  No audible bruit.  HEART:  Appears to be regular. LUNGS:  No crackles or wheezing audible.  Respirations even and unlabored.  RADIAL PULSE:  Equal bilaterally.    BREASTS:  No nipple discharge or nipple retraction present.  Could not appreciate any distinct nodules or axillary adenopathy.  ABDOMEN:  Soft, nontender.  Bowel sounds present and normal.  No audible abdominal bruit.  GU:  Not performed.    EXTREMITIES:  No increased edema present.  DP pulses palpable and equal bilaterally.   SKIN:  Left lateral breast lesion and right lateral chest wall lesion.          Assessment & Plan:   CARDIOVASCULAR.  Had a stress myoview 12/01/09.  ER 68% with no ischemia.  ECHO - EF 55% with mild AI.  Currently asymptomatic.  Continue risk factor modification.   INCREASED PSYCHOSOCIAL STRESSORS.  Doing well on Zoloft.  Follow.     PULMONARY.  CXR 06/21/10 - no acute cardiopulmonary disease.  Asymptomatic.  Using CPAP.   HEALTH MAINTENANCE.  Physical today.  Pap 06/04/12 - negative with negative HPV.   Mammogram 04/04/13 - Birads I. Colonoscopy 02/13/09 - internal hemorrhoids.    I spent 25 minutes with the patient and more than 50% of the time was spent in consultation regarding the above.

## 2013-08-25 ENCOUNTER — Encounter: Payer: Self-pay | Admitting: Internal Medicine

## 2013-08-25 ENCOUNTER — Telehealth: Payer: Self-pay | Admitting: Internal Medicine

## 2013-08-25 DIAGNOSIS — R5383 Other fatigue: Secondary | ICD-10-CM | POA: Insufficient documentation

## 2013-08-25 DIAGNOSIS — R198 Other specified symptoms and signs involving the digestive system and abdomen: Secondary | ICD-10-CM | POA: Insufficient documentation

## 2013-08-25 DIAGNOSIS — L989 Disorder of the skin and subcutaneous tissue, unspecified: Secondary | ICD-10-CM | POA: Insufficient documentation

## 2013-08-25 MED ORDER — ROSUVASTATIN CALCIUM 5 MG PO TABS: 5.0000 mg | ORAL_TABLET | Freq: Every day | ORAL | Status: DC

## 2013-08-25 NOTE — Assessment & Plan Note (Signed)
S/p injection.  Doing better.  Follow.   

## 2013-08-25 NOTE — Telephone Encounter (Signed)
crestor 5mg  sent in to pharmacy.  (#30 with 2 refills).

## 2013-08-25 NOTE — Assessment & Plan Note (Signed)
Persistent skin lesion on left breast and right lateral chest wall.  Refer to Dr Gwen Pounds.

## 2013-08-25 NOTE — Assessment & Plan Note (Signed)
Using CPAP.  Doing well.  Follow.   

## 2013-08-25 NOTE — Assessment & Plan Note (Signed)
Symptoms controlled.  On prevacid.  Follow.

## 2013-08-25 NOTE — Assessment & Plan Note (Signed)
Low cholesterol diet and exercise.   Will follow.     

## 2013-08-25 NOTE — Assessment & Plan Note (Signed)
Noted on EGD.  On prevacid.  Symptoms controlled.

## 2013-08-25 NOTE — Assessment & Plan Note (Addendum)
Check cbc, met c, B12 and tsh.  Treat bowel issues as outlined.  Follow.

## 2013-08-25 NOTE — Assessment & Plan Note (Signed)
Some bowel urgency as outlined.  Intermittent flares.  No blood.  Is s/p cholecystectomy.  Will add fiber and probiotic.  Follow.

## 2013-10-08 ENCOUNTER — Other Ambulatory Visit: Payer: BC Managed Care – PPO

## 2013-10-17 ENCOUNTER — Other Ambulatory Visit: Payer: BC Managed Care – PPO

## 2013-10-18 ENCOUNTER — Other Ambulatory Visit: Payer: Self-pay | Admitting: Internal Medicine

## 2013-10-18 NOTE — Telephone Encounter (Signed)
Refilled zoloft #30 with 2 refills.   

## 2013-10-18 NOTE — Telephone Encounter (Signed)
Please advise.  Ok to fill? 

## 2013-10-25 ENCOUNTER — Other Ambulatory Visit (INDEPENDENT_AMBULATORY_CARE_PROVIDER_SITE_OTHER): Payer: BC Managed Care – PPO

## 2013-10-25 DIAGNOSIS — E7800 Pure hypercholesterolemia, unspecified: Secondary | ICD-10-CM

## 2013-10-25 LAB — LIPID PANEL
Cholesterol: 270 mg/dL — ABNORMAL HIGH (ref 0–200)
HDL: 53.6 mg/dL (ref >39.00)
LDL Cholesterol: 177 mg/dL — ABNORMAL HIGH (ref 0–99)
NonHDL: 216.4
Total CHOL/HDL Ratio: 5
Triglycerides: 195 mg/dL — ABNORMAL HIGH (ref 0.0–149.0)
VLDL: 39 mg/dL (ref 0.0–40.0)

## 2013-12-09 ENCOUNTER — Other Ambulatory Visit: Payer: BC Managed Care – PPO

## 2014-01-14 ENCOUNTER — Other Ambulatory Visit: Payer: Self-pay | Admitting: Internal Medicine

## 2014-01-31 ENCOUNTER — Telehealth: Payer: Self-pay | Admitting: Internal Medicine

## 2014-01-31 NOTE — Telephone Encounter (Signed)
Patient Information:  Caller Name: Darel HongJudy  Phone: [phone]  Patient: Glennie HawkHarrington, Kyiah E  Gender: Female  DOB: 01/27/1951  Age: 63 Years  PCP: Dale DurhamScott, Charlene  Office Follow Up:  Does the office need to follow up with this patient?: No  Instructions For The Office: N/A  RN Note:  [facility] and pt doesn't want to go to another [facility]. Advised to go to [facility]. Pt verbalized understanding.  Symptoms  Reason For Call & Symptoms: Calling about cough with crackles/congestion in chest, runny nose, right ear stopped up--> has been in bed since Wed. Had a fever and sore throat this week. Just not getting any better and wants to be seen.  Reviewed Health History In EMR: Yes  Reviewed Medications In EMR: Yes  Reviewed Allergies In EMR: Yes  Reviewed Surgeries / Procedures: Yes  Date of Onset of Symptoms: 12/31/2013  Treatments Tried: Dayquil,Nyquil.Mucinex  Treatments Tried Worked: No  Guideline(s) Used:  Cold Symptoms (Upper Respiratory Infection)  Colds  Disposition Per Guideline:   See Today or Tomorrow in Office  Reason For Disposition Reached:   Patient wants to be seen  Advice Given:  Call Back If:  You become worse  Patient Will Follow Care Advice:  YES

## 2014-01-31 NOTE — Telephone Encounter (Signed)
Spoke with pt, states she stated she is going to give it another day before going to UC.

## 2014-01-31 NOTE — Telephone Encounter (Signed)
Received message.  Please call pt and confirm was seen today.  If needs f/u evaluation, just let me  Know.

## 2014-02-01 NOTE — Telephone Encounter (Signed)
Please call pt and f/u and make sure doing ok.  Does she need to be see this week?

## 2014-02-03 NOTE — Telephone Encounter (Signed)
Spoke with pt, she states she was seen over the weekend on [facility].  She states she is at work today feeling better.

## 2014-02-24 ENCOUNTER — Ambulatory Visit (INDEPENDENT_AMBULATORY_CARE_PROVIDER_SITE_OTHER): Payer: BC Managed Care – PPO | Admitting: Internal Medicine

## 2014-02-24 ENCOUNTER — Encounter: Payer: Self-pay | Admitting: Internal Medicine

## 2014-02-24 VITALS — BP 120/80 | HR 71 | Temp 99.0°F | Ht 60.5 in | Wt 144.2 lb

## 2014-02-24 DIAGNOSIS — Z658 Other specified problems related to psychosocial circumstances: Secondary | ICD-10-CM

## 2014-02-24 DIAGNOSIS — K219 Gastro-esophageal reflux disease without esophagitis: Secondary | ICD-10-CM

## 2014-02-24 DIAGNOSIS — E2839 Other primary ovarian failure: Secondary | ICD-10-CM

## 2014-02-24 DIAGNOSIS — E7800 Pure hypercholesterolemia, unspecified: Secondary | ICD-10-CM

## 2014-02-24 DIAGNOSIS — F439 Reaction to severe stress, unspecified: Secondary | ICD-10-CM

## 2014-02-24 DIAGNOSIS — K209 Esophagitis, unspecified without bleeding: Secondary | ICD-10-CM

## 2014-02-24 DIAGNOSIS — G4733 Obstructive sleep apnea (adult) (pediatric): Secondary | ICD-10-CM

## 2014-02-24 LAB — COMPREHENSIVE METABOLIC PANEL
ALT: 24 U/L (ref 0–35)
AST: 23 U/L (ref 0–37)
Albumin: 4.3 g/dL (ref 3.5–5.2)
Alkaline Phosphatase: 44 U/L (ref 39–117)
BUN: 17 mg/dL (ref 6–23)
CO2: 22 mEq/L (ref 19–32)
Calcium: 9.4 mg/dL (ref 8.4–10.5)
Chloride: 105 mEq/L (ref 96–112)
Creatinine, Ser: 0.8 mg/dL (ref 0.4–1.2)
GFR: 77.23 mL/min (ref >60.00)
Glucose, Bld: 105 mg/dL — ABNORMAL HIGH (ref 70–99)
Potassium: 4 mEq/L (ref 3.5–5.1)
Sodium: 136 mEq/L (ref 135–145)
Total Bilirubin: 0.6 mg/dL (ref 0.2–1.2)
Total Protein: 7.1 g/dL (ref 6.0–8.3)

## 2014-02-24 LAB — CBC WITH DIFFERENTIAL/PLATELET
Basophils Absolute: 0 10*3/uL (ref 0.0–0.1)
Basophils Relative: 0.5 % (ref 0.0–3.0)
Eosinophils Absolute: 0.2 10*3/uL (ref 0.0–0.7)
Eosinophils Relative: 3.4 % (ref 0.0–5.0)
HCT: 37 % (ref 36.0–46.0)
Hemoglobin: 12.3 g/dL (ref 12.0–15.0)
Lymphocytes Relative: 48.3 % — ABNORMAL HIGH (ref 12.0–46.0)
Lymphs Abs: 2.3 10*3/uL (ref 0.7–4.0)
MCHC: 33.1 g/dL (ref 30.0–36.0)
MCV: 88.7 fl (ref 78.0–100.0)
Monocytes Absolute: 0.3 10*3/uL (ref 0.1–1.0)
Monocytes Relative: 6.5 % (ref 3.0–12.0)
Neutro Abs: 2 10*3/uL (ref 1.4–7.7)
Neutrophils Relative %: 41.3 % — ABNORMAL LOW (ref 43.0–77.0)
Platelets: 245 10*3/uL (ref 150.0–400.0)
RBC: 4.17 Mil/uL (ref 3.87–5.11)
RDW: 13.4 % (ref 11.5–15.5)
WBC: 4.8 10*3/uL (ref 4.0–10.5)

## 2014-02-24 LAB — LIPID PANEL
Cholesterol: 174 mg/dL (ref 0–200)
HDL: 49.9 mg/dL (ref >39.00)
LDL Cholesterol: 90 mg/dL (ref 0–99)
NonHDL: 124.1
Total CHOL/HDL Ratio: 3
Triglycerides: 169 mg/dL — ABNORMAL HIGH (ref 0.0–149.0)
VLDL: 33.8 mg/dL (ref 0.0–40.0)

## 2014-02-24 NOTE — Progress Notes (Signed)
Subjective:    Patient ID: Jessica Peck, female    DOB: 01/01/1951, 63 y.o.   MRN: [id number]  HPI 63 year old female with past history of hypercholesterolemia and esophagitis who comes in today for a scheduled follow up.  Stomach doing ok.  Still some acid reflux.  She is not taking the prevacid daily.  Discussed the need to take daily.   Had her gallbladder removed - 10/13.  No significant abdominal pain or cramping.  Bowels are better on the probiotics.  Some increased stress, but she feels she is handling stress well.   No chest pain or tightness with increased activity or exertion.  Breathing stable.  Has been seeing a chiropractor for her neck.  Better.  Recent URI.  Better.  Just minimal residual cough.  Overall she feels she is doing well.         Past Medical History  Diagnosis Date  . Hypercholesterolemia   . Esophagitis   . Gastritis     EGD (hiatal hernia)  . Chronic constipation   . GERD (gastroesophageal reflux disease)   . Vitamin D deficiency   . Sleep apnea     CPAP    Current Outpatient Prescriptions on File Prior to Visit  Medication Sig Dispense Refill  . conjugated estrogens (PREMARIN) vaginal cream Place vaginally as needed. 42.5 g 2  . lansoprazole (PREVACID) 30 MG capsule TAKE 1 CAPSULE BY MOUTH TWICE A DAY 180 capsule 1  . rosuvastatin (CRESTOR) 5 MG tablet Take 1 tablet (5 mg total) by mouth daily. 30 tablet 2  . sertraline (ZOLOFT) 100 MG tablet TAKE 1 TABLET (100 MG TOTAL) BY MOUTH DAILY. 30 tablet 2  . VITAMIN D, CHOLECALCIFEROL, PO Take 4,000 Units by mouth daily.      No current facility-administered medications on file prior to visit.    Review of Systems Patient denies any headache, lightheadedness or dizziness.   No sinus or allergy symptoms.  No chest pain, tightness or palpitations.  No increased shortness of breath or congestion.  Only minimal residual cough.  No nausea or vomiting.  Still some acid reflux.  Not taking her prevacid regularly.   Will start.  No abdominal pain or cramping.  No significant bowel change, such as constipation, BRBPR or melana.  Bowels better on probiotics.  No urine change.  Handling stress well.  Neck better after seeing a chiropractor.          Objective:   Physical Exam  Filed Vitals:   02/24/14 0752  BP: 120/80  Pulse: 71  Temp: 99 F (2537.492 C)   63 year old female in no acute distress.   HEENT:  Nares- clear.  Oropharynx - without lesions. NECK:  Supple.  Nontender.  No audible bruit.  HEART:  Appears to be regular. LUNGS:  No crackles or wheezing audible.  Respirations even and unlabored.  RADIAL PULSE:  Equal bilaterally.  ABDOMEN:  Soft, nontender.  Bowel sounds present and normal.  No audible abdominal bruit.  EXTREMITIES:  No increased edema present.        Assessment & Plan:  1. Gastroesophageal reflux disease, esophagitis presence not specified Persistent.  Not taking prevacid regularly.  Will restart daily prevacid.  - CBC with Differential  2. Pure hypercholesterolemia Low cholesterol diet and exercise.  Continue crestor.  - Comprehensive metabolic panel - Lipid panel  3. Obstructive sleep apnea Continue CPAP.   4. Esophagitis Given persistent reflux.  Seeing GI.  Will see if  they agree with EGD with colonoscopy.    5. Stress On zoloft.  Handling stress well.  Follow.   6. Estrogen deficiency On PPI.  - DG Bone Density; Future   CARDIOVASCULAR.  Had a stress myoview 12/01/09.  ER 68% with no ischemia.  ECHO - EF 55% with mild AI.  Currently asymptomatic.  Continue risk factor modification.      PULMONARY.  CXR 06/21/10 - no acute cardiopulmonary disease.  Asymptomatic.  Using CPAP.   HEALTH MAINTENANCE.  Physical 08/22/13.  Pap 06/04/12 - negative with negative HPV.   Mammogram 04/04/13 - Birads I.  She will schedule f/u mammogram.  Colonoscopy 02/13/09 - internal hemorrhoids.  GI has sent her notice for f/u colonoscopy.  She will arrange.   I spent 25 minutes with the  patient and more than 50% of the time was spent in consultation regarding the above.

## 2014-02-24 NOTE — Progress Notes (Signed)
Pre visit review using our clinic review tool, if applicable. No additional management support is needed unless otherwise documented below in the visit note. 

## 2014-02-25 ENCOUNTER — Encounter: Payer: Self-pay | Admitting: *Deleted

## 2014-03-10 LAB — HM DEXA SCAN

## 2014-03-11 ENCOUNTER — Other Ambulatory Visit: Payer: Self-pay | Admitting: Internal Medicine

## 2014-03-16 IMAGING — CT CT ABD-PELV W/ CM
1 of 2 series · 15 of 32 positions shown, 19 images · IV contrast (isovue)
Comparison: none

REASON FOR EXAM: Nausea vomiting  epigastric pain
COMMENTS:

PROCEDURE:     CT  - CT ABDOMEN / PELVIS  W  - November 23, 2011  [DATE]
RESULT:     Comparison:  None
TECHNIQUE: Multiple axial images of the abdomen and pelvis were performed
from the lung bases to the pubic symphysis, with p.o. contrast and with 100
mL of Isovue 300 intravenous contrast.

[Series 2: soft tissue · axial · 0.64mm/px · z∈[-861,-483]mm · 15 of 138 slices shown, 19 images]
[im 6/138  soft-tissue]
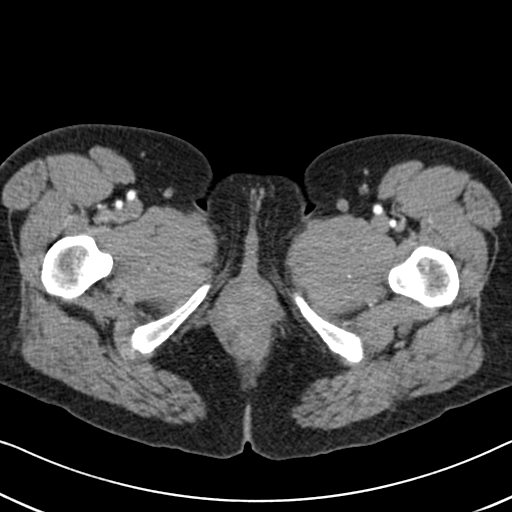
[im 6/138  bone]
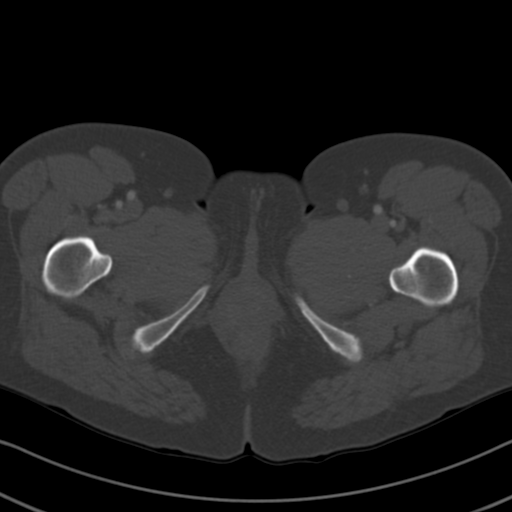
[im 18/138  soft-tissue]
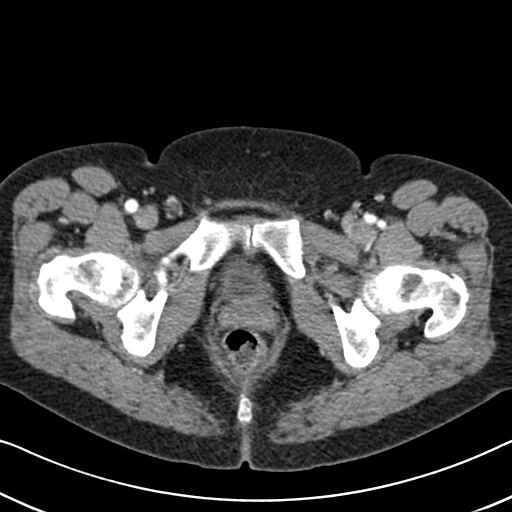
[im 29/138  soft-tissue]
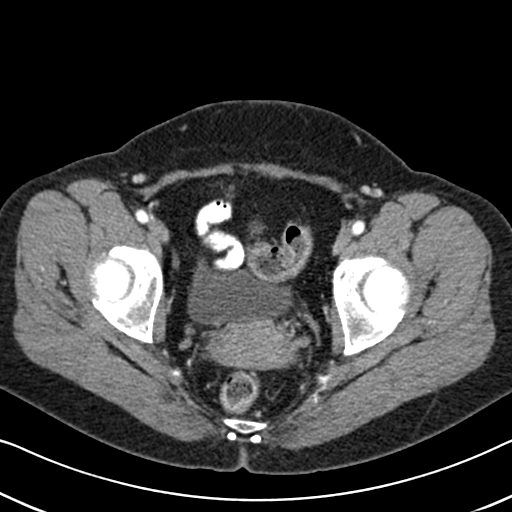
[im 40/138  soft-tissue]
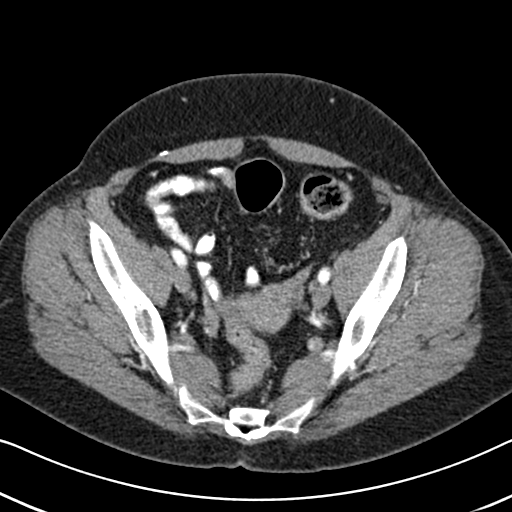
[im 46/138  soft-tissue]
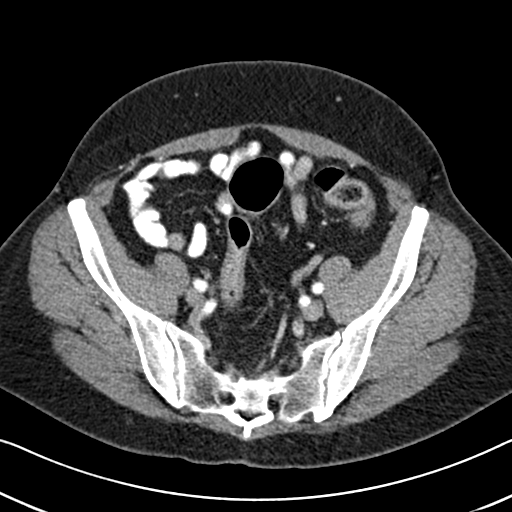
[im 58/138  soft-tissue]
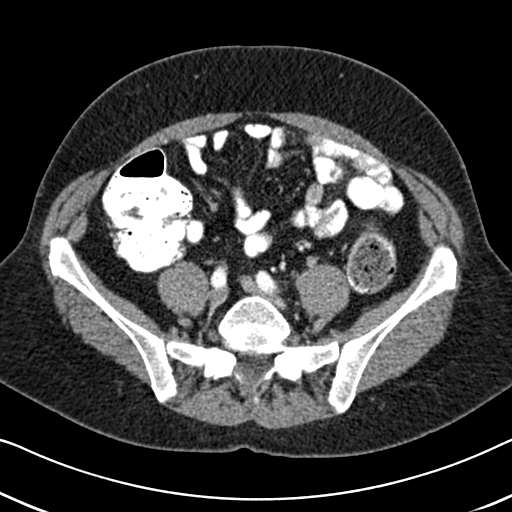
[im 69/138  soft-tissue]
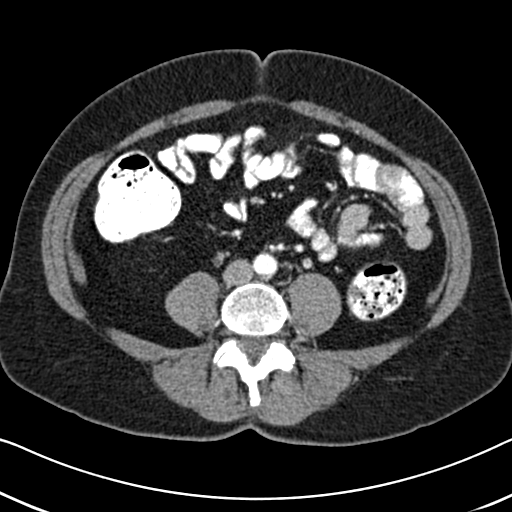
[im 80/138  soft-tissue]
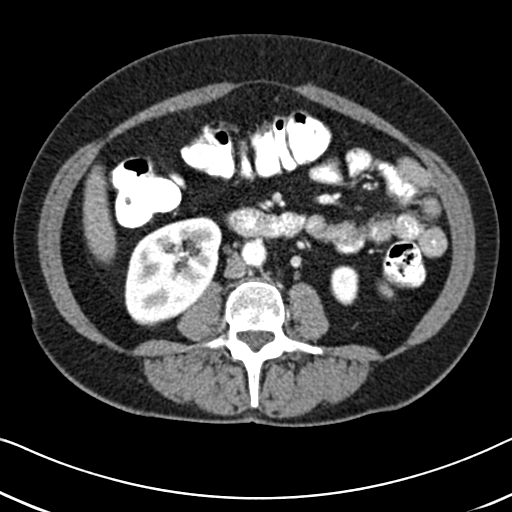
[im 92/138  soft-tissue]
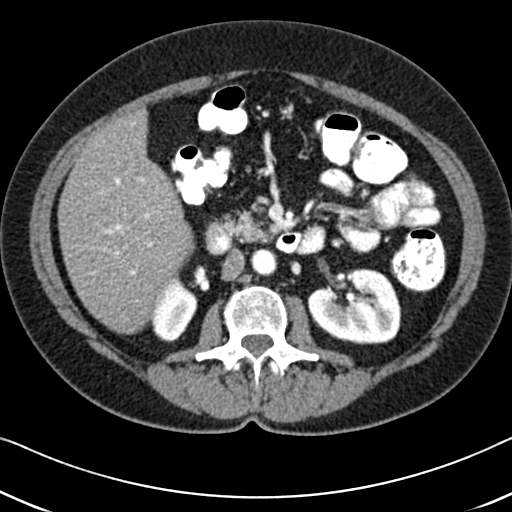
[im 92/138  bone]
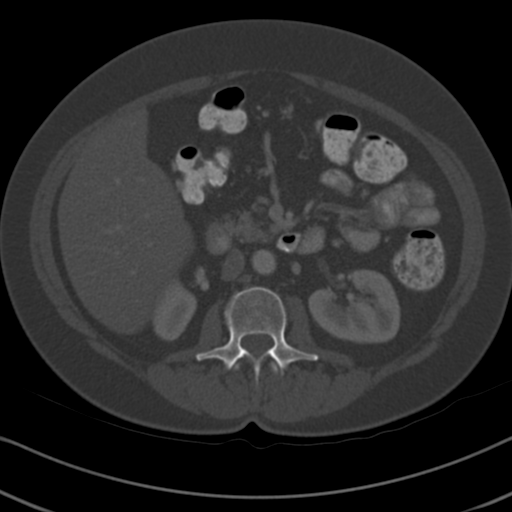
[im 98/138  soft-tissue]
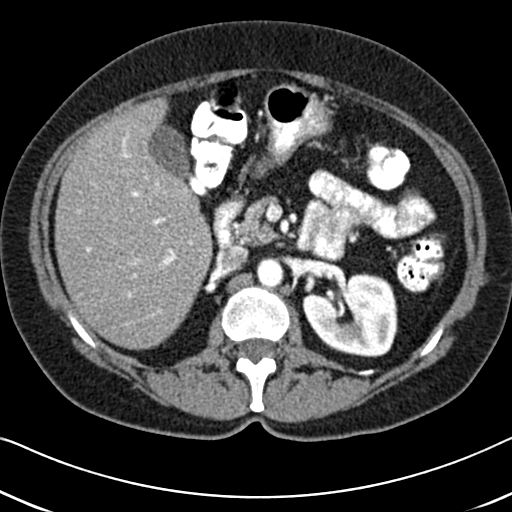
[im 109/138  soft-tissue]
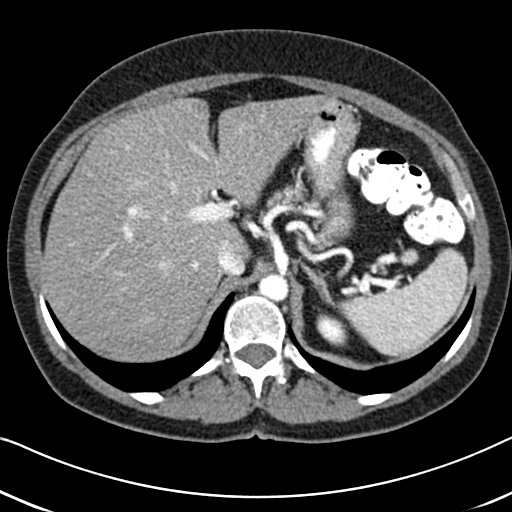
[im 115/138  lung]
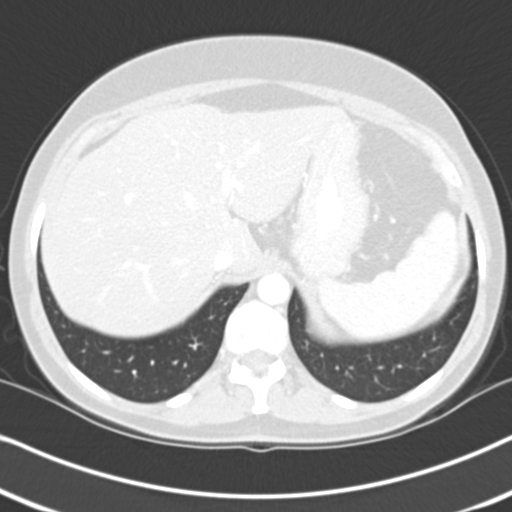
[im 120/138  soft-tissue]
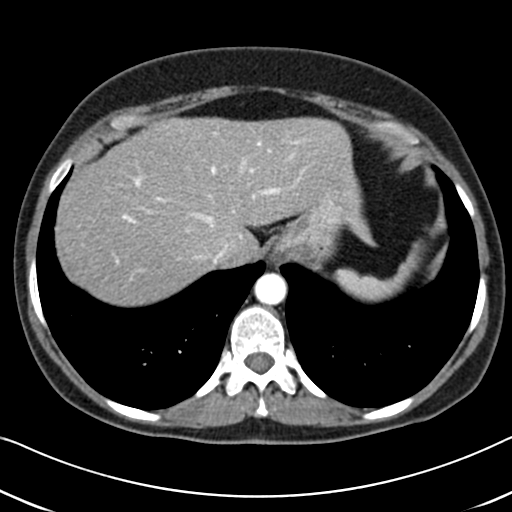
[im 120/138  lung]
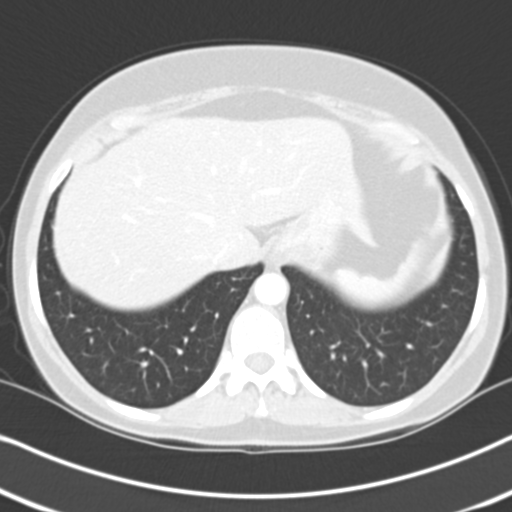
[im 126/138  lung]
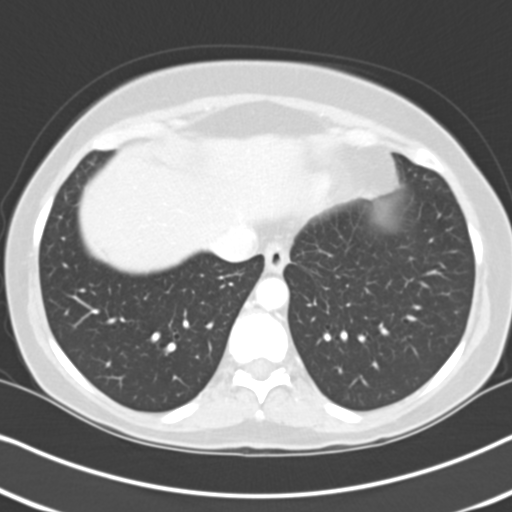
[im 132/138  soft-tissue]
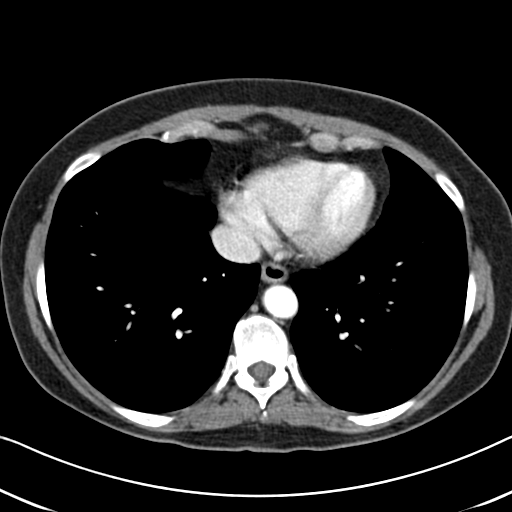
[im 132/138  lung]
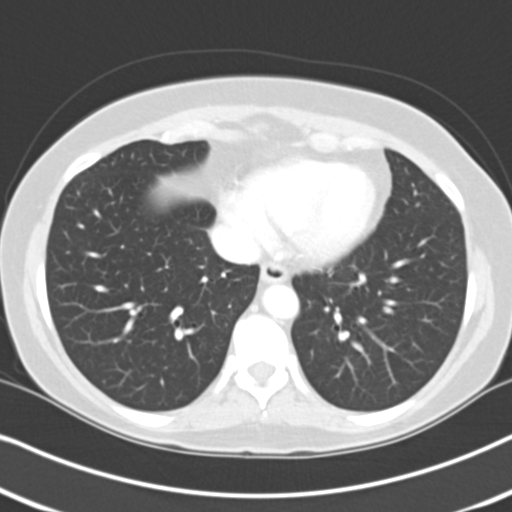

[15 of 32 positions shown; findings below may reference images not displayed]

FINDINGS: The liver is slightly low in attenuation, raising the possibility of hepatic
steatosis. The gallbladder, spleen, adrenals, and pancreas are unremarkable.
The kidneys enhance normally.

The uterus is somewhat heterogeneous it is known. This may be related to
fibroids. The small and large bowel are normal in caliber. The appendix is
not definitely identified. However, there are no inflammatory changes in the
right lower quadrant. There is a small fat-containing periumbilical hernia.

No aggressive lytic or sclerotic osseous lesions are identified.
IMPRESSION: 1. No acute findings in the abdomen or pelvis.
2. The uterus demonstrates somewhat heterogeneous enhancement which is
nonspecific and may be related to small fibroids. Further evaluation could
be provided with pelvic ultrasound, as indicated.

## 2014-03-20 ENCOUNTER — Encounter: Payer: Self-pay | Admitting: Internal Medicine

## 2014-03-23 IMAGING — NM NUCLEAR MEDICINE HEPATOHBILIARY INCLUDE GB
2 series · 14 of 14 positions shown · non-contrast
Comparison: none

REASON FOR EXAM: nausea vomiting epigastric pain
COMMENTS:

[Series 1000: gallbladder statics · 4.80mm/px · 8 of 8 slices shown]
[im 1/8]
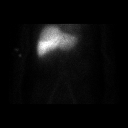
[im 2/8]
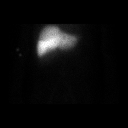
[im 3/8]
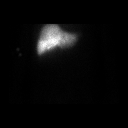
[im 4/8]
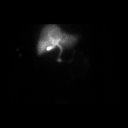
[im 5/8]
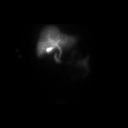
[im 6/8]
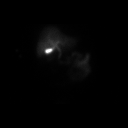
[im 7/8]
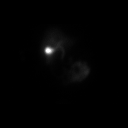
[im 8/8]
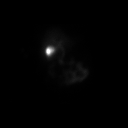

[Series 1000: gallbladder ef · 4.80mm/px · 6 of 30 frames shown]
[frame 3/30]
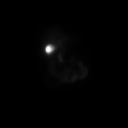
[frame 8/30]
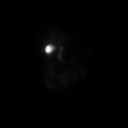
[frame 13/30]
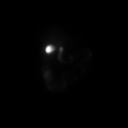
[frame 18/30]
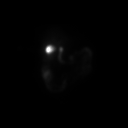
[frame 23/30]
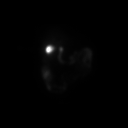
[frame 28/30]
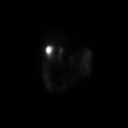

[14 of 14 positions shown; findings below may reference images not displayed]

PROCEDURE:     NM  - NM HEPATO WITH GB EJECT FRACTION  - November 30, 2011 [DATE]

RESULT:     The patient received an injection of 7.1 mCi of technetium 99m
Choletec. There is prompt extraction of tracer from the blood pool by the
with gallbladder activity present by 10 to 15 minutes after injection.
Common bile duct and small bowel activity are present at 15 minutes after
injection. There is continued clearing of activity from the hepatic
parenchyma throughout the study. A total dose 1.27 mcg of sincalide was
initiated over 30 minutes with gallbladder ejection fraction calculated at
31%. The patient experienced some abdominal cramping rating a one on a 1 to
10 scale during the first 15 minutes of the exam.
IMPRESSION: 1. Normal-appearing hepatobiliary scan.
2. Gallbladder ejection fraction in response to sincalide infusion is 31%
which is marginally low. The correlate clinically.

[REDACTED]

## 2014-03-30 ENCOUNTER — Telehealth: Payer: Self-pay | Admitting: Internal Medicine

## 2014-03-30 NOTE — Telephone Encounter (Signed)
Notify pt that her bone density reveals osteopenia.  Some loss.  Does not meet criteria for osteoporosis.  I recommend calcium, vitamin D and weight bearing exercise.

## 2014-03-31 ENCOUNTER — Encounter: Payer: Self-pay | Admitting: *Deleted

## 2014-03-31 NOTE — Telephone Encounter (Signed)
Letter mailed

## 2014-04-16 ENCOUNTER — Encounter: Payer: Self-pay | Admitting: Internal Medicine

## 2014-05-20 ENCOUNTER — Ambulatory Visit: Payer: Self-pay | Admitting: Internal Medicine

## 2014-07-15 NOTE — Op Note (Signed)
PATIENT NAME:  Jessica Peck, Jessica Peck MR#:  [id number] DATE OF BIRTH:  Aug 24, 1950  DATE OF PROCEDURE:  12/29/2011  PREOPERATIVE DIAGNOSIS: Chronic cholecystitis.   POSTOPERATIVE DIAGNOSIS: Chronic cholecystitis.   PROCEDURE: Laparoscopic cholecystectomy.   SURGEON: Quentin Orealph L. Ely, M.D.   ASSISTANChristen Butter: [employer] student.   ANESTHESIA: General.   OPERATIVE PROCEDURE: With the patient in the supine position after the induction of appropriate general anesthesia, the patient's abdomen was prepped with ChloraPrep and draped in sterile towels. The patient was placed in the head down, feet up position. A small infraumbilical incision was made in the standard fashion and carried down bluntly through the subcutaneous tissue. The Veress needle was used to cannulate the peritoneal cavity. CO2 was insufflated to appropriate pressure measurements. When approximately 2.5 liters of CO2 were instilled, the Veress needle was withdrawn and a 5 mm applied port inserted into the peritoneal cavity. Intraperitoneal position was confirmed and CO2 was reinsufflated. The patient was placed in the head up, feet down position and rotated slightly to the left side. A subxiphoid transverse incision was made and a 5 mm port inserted under direct vision. Two lateral ports, 5 mm in size, were inserted under direct vision. The gallbladder was retracted superiorly and laterally exposing the hepatoduodenal ligament. The cystic artery and cystic duct were easily identified. The right hepatic artery lay over the cystic duct. The common duct was easily visualized. The cystic artery was identified, doubly clipped and divided, carefully preserving the right hepatic artery. Cystic duct was exposed. It was very small and because she did not have any stones I did not feel she was a candidate for cholangiography. The cystic duct was doubly clipped on both sides and divided. The gallbladder was then dissected free from its bed and delivered using hook and  cautery apparatus. Once the gallbladder was free, the camera was switched to the subxiphoid port and the gallbladder was brought through the umbilical port using a grasping instrument. The area was copiously suctioned and irrigated. Some bile was spilled around the dome of the gallbladder so that area was carefully irrigated with 2 liters of saline. The midline fascia was closed with figure-of-eight suture of 0 Vicryl using a needle passer. The abdomen was then desufflated. The remaining ports were withdrawn without difficulty. Skin incisions were closed with 5-0 nylon. The area was infiltrated with 0.25% Marcaine for postoperative pain control. Sterile dressings were applied. The patient was returned to the recovery room having tolerated the procedure well. Sponge, instrument, and needle counts were correct x2 in the operating room.  ____________________________ Quentin Orealph L. Ely III, MD rle:slb D: 12/29/2011 12:19:57 ET T: 12/29/2011 12:32:17 ET JOB#: [id number]  cc: Quentin Orealph L. Ely III, MD, <Dictator> Dale Durhamharlene Scott, MD Quentin OreALPH L ELY MD ELECTRONICALLY SIGNED 12/30/2011 15:02

## 2014-08-26 ENCOUNTER — Encounter: Payer: BC Managed Care – PPO | Admitting: Internal Medicine

## 2014-08-26 DIAGNOSIS — Z0289 Encounter for other administrative examinations: Secondary | ICD-10-CM

## 2014-09-07 ENCOUNTER — Other Ambulatory Visit: Payer: Self-pay | Admitting: Internal Medicine

## 2014-12-08 ENCOUNTER — Telehealth: Payer: Self-pay | Admitting: Internal Medicine

## 2014-12-08 DIAGNOSIS — G4733 Obstructive sleep apnea (adult) (pediatric): Secondary | ICD-10-CM

## 2014-12-08 DIAGNOSIS — K219 Gastro-esophageal reflux disease without esophagitis: Secondary | ICD-10-CM

## 2014-12-08 DIAGNOSIS — E7800 Pure hypercholesterolemia, unspecified: Secondary | ICD-10-CM

## 2014-12-08 NOTE — Telephone Encounter (Signed)
Please schedule. Thank you

## 2014-12-08 NOTE — Telephone Encounter (Signed)
Orders placed for labs.  Ok to schedule.  Physical is 12/11/14

## 2014-12-08 NOTE — Telephone Encounter (Signed)
Pt would like to have lab work done before physical. Need lab order please and Thank You!

## 2014-12-08 NOTE — Telephone Encounter (Signed)
Please advise lab orders.

## 2014-12-11 ENCOUNTER — Encounter: Payer: Self-pay | Admitting: Internal Medicine

## 2014-12-11 ENCOUNTER — Other Ambulatory Visit (INDEPENDENT_AMBULATORY_CARE_PROVIDER_SITE_OTHER): Payer: BC Managed Care – PPO

## 2014-12-11 ENCOUNTER — Ambulatory Visit (INDEPENDENT_AMBULATORY_CARE_PROVIDER_SITE_OTHER): Payer: BC Managed Care – PPO | Admitting: Internal Medicine

## 2014-12-11 VITALS — BP 120/70 | HR 74 | Temp 97.8°F | Ht 60.0 in | Wt 141.2 lb

## 2014-12-11 DIAGNOSIS — R0602 Shortness of breath: Secondary | ICD-10-CM

## 2014-12-11 DIAGNOSIS — Z0000 Encounter for general adult medical examination without abnormal findings: Secondary | ICD-10-CM

## 2014-12-11 DIAGNOSIS — E7800 Pure hypercholesterolemia, unspecified: Secondary | ICD-10-CM

## 2014-12-11 DIAGNOSIS — F439 Reaction to severe stress, unspecified: Secondary | ICD-10-CM

## 2014-12-11 DIAGNOSIS — G4733 Obstructive sleep apnea (adult) (pediatric): Secondary | ICD-10-CM

## 2014-12-11 DIAGNOSIS — K219 Gastro-esophageal reflux disease without esophagitis: Secondary | ICD-10-CM

## 2014-12-11 DIAGNOSIS — Z658 Other specified problems related to psychosocial circumstances: Secondary | ICD-10-CM

## 2014-12-11 LAB — BASIC METABOLIC PANEL
BUN: 18 mg/dL (ref 6–23)
CO2: 26 mEq/L (ref 19–32)
Calcium: 9.7 mg/dL (ref 8.4–10.5)
Chloride: 105 mEq/L (ref 96–112)
Creatinine, Ser: 0.72 mg/dL (ref 0.40–1.20)
GFR: 86.99 mL/min (ref >60.00)
Glucose, Bld: 95 mg/dL (ref 70–99)
Potassium: 4.2 mEq/L (ref 3.5–5.1)
Sodium: 138 mEq/L (ref 135–145)

## 2014-12-11 LAB — HEPATIC FUNCTION PANEL
ALT: 20 U/L (ref 0–35)
AST: 22 U/L (ref 0–37)
Albumin: 4.4 g/dL (ref 3.5–5.2)
Alkaline Phosphatase: 43 U/L (ref 39–117)
Bilirubin, Direct: 0.1 mg/dL (ref 0.0–0.3)
Total Bilirubin: 0.4 mg/dL (ref 0.2–1.2)
Total Protein: 7.2 g/dL (ref 6.0–8.3)

## 2014-12-11 LAB — LIPID PANEL
Cholesterol: 222 mg/dL — ABNORMAL HIGH (ref 0–200)
HDL: 54.4 mg/dL (ref >39.00)
LDL Cholesterol: 137 mg/dL — ABNORMAL HIGH (ref 0–99)
NonHDL: 167.29
Total CHOL/HDL Ratio: 4
Triglycerides: 153 mg/dL — ABNORMAL HIGH (ref 0.0–149.0)
VLDL: 30.6 mg/dL (ref 0.0–40.0)

## 2014-12-11 LAB — CBC WITH DIFFERENTIAL/PLATELET
Basophils Absolute: 0 10*3/uL (ref 0.0–0.1)
Basophils Relative: 0.4 % (ref 0.0–3.0)
Eosinophils Absolute: 0.1 10*3/uL (ref 0.0–0.7)
Eosinophils Relative: 2.4 % (ref 0.0–5.0)
HCT: 39 % (ref 36.0–46.0)
Hemoglobin: 13.1 g/dL (ref 12.0–15.0)
Lymphocytes Relative: 51.9 % — ABNORMAL HIGH (ref 12.0–46.0)
Lymphs Abs: 2.4 10*3/uL (ref 0.7–4.0)
MCHC: 33.7 g/dL (ref 30.0–36.0)
MCV: 88.6 fl (ref 78.0–100.0)
Monocytes Absolute: 0.2 10*3/uL (ref 0.1–1.0)
Monocytes Relative: 4.6 % (ref 3.0–12.0)
Neutro Abs: 1.9 10*3/uL (ref 1.4–7.7)
Neutrophils Relative %: 40.7 % — ABNORMAL LOW (ref 43.0–77.0)
Platelets: 250 10*3/uL (ref 150.0–400.0)
RBC: 4.41 Mil/uL (ref 3.87–5.11)
RDW: 13.9 % (ref 11.5–15.5)
WBC: 4.6 10*3/uL (ref 4.0–10.5)

## 2014-12-11 LAB — TSH: TSH: 2.59 u[IU]/mL (ref 0.35–4.50)

## 2014-12-11 NOTE — Progress Notes (Signed)
Pre-visit discussion using our clinic review tool. No additional management support is needed unless otherwise documented below in the visit note.  

## 2014-12-11 NOTE — Progress Notes (Signed)
Patient ID: Jessica Peck, female   DOB: 12-03-1950, 64 y.o.   MRN: [id number]   Subjective:    Patient ID: Jessica Peck, female    DOB: February 02, 1951, 64 y.o.   MRN: [id number]  HPI  Patient here to follow up on her current medical issues as well as for a physical exam.  Feels she is handling stress relatively well.  Does not feel she needs any further intervention at this time.  Reports some sob with exertion.  Some fatigue.  Still will have some acid reflux.  Saw GI.  Planning for colonoscopy.  No EGD scheduled.  Saw GI.  They changed her acid reflux medication.  Discussed diet adjustment.  No nausea or vomiting.  Bowels stable.  Discussed cholesterol values.  Discussed diet and exercise.     Past Medical History  Diagnosis Date  . Hypercholesterolemia   . Esophagitis   . Gastritis     EGD (hiatal hernia)  . Chronic constipation   . GERD (gastroesophageal reflux disease)   . Vitamin D deficiency   . Sleep apnea     CPAP   Past Surgical History  Procedure Laterality Date  . Appendectomy  1976   Family History  Problem Relation Age of Onset  . Colon cancer Father   . Colon cancer Mother   . COPD Mother   . Heart disease Mother   . Breast cancer Sister     died age 25  . Breast cancer      paternal aunts   Social History   Social History  . Marital Status: Married    Spouse Name: N/A  . Number of Children: 2  . Years of Education: N/A   Social History Main Topics  . Smoking status: Never Smoker   . Smokeless tobacco: Never Used  . Alcohol Use: 0.0 oz/week    0 Standard drinks or equivalent per week     Comment: occasional  . Drug Use: No  . Sexual Activity: Not Asked   Other Topics Concern  . None   Social History Narrative    Outpatient Encounter Prescriptions as of 12/11/2014  Medication Sig  . conjugated estrogens (PREMARIN) vaginal cream Place vaginally as needed.  . Cyanocobalamin (RA VITAMIN B-12 TR) 1000 MCG TBCR Take by mouth.  . lansoprazole  (PREVACID) 30 MG capsule TAKE 1 CAPSULE BY MOUTH TWICE A DAY (Patient taking differently: TAKE 1 CAPSULE BY MOUTH TWICE A DAY AS NEEDED)  . ranitidine (ZANTAC) 150 MG capsule TAKE 1 CAPSULE (150 MG TOTAL) BY MOUTH NIGHTLY.  . sertraline (ZOLOFT) 100 MG tablet TAKE 1 TABLET (100 MG TOTAL) BY MOUTH DAILY.  Marland Kitchen VITAMIN D, CHOLECALCIFEROL, PO Take 4,000 Units by mouth daily.   . rosuvastatin (CRESTOR) 5 MG tablet Take 1 tablet (5 mg total) by mouth daily. (Patient not taking: Reported on 12/11/2014)   No facility-administered encounter medications on file as of 12/11/2014.    Review of Systems  Constitutional: Positive for fatigue. Negative for appetite change and unexpected weight change.  HENT: Negative for congestion and sinus pressure.   Eyes: Negative for pain and visual disturbance.  Respiratory: Positive for shortness of breath (has noticed with exertion. ). Negative for cough and chest tightness.   Cardiovascular: Negative for chest pain and palpitations.  Gastrointestinal: Negative for nausea, vomiting, abdominal pain and diarrhea.  Genitourinary: Negative for dysuria and difficulty urinating.  Musculoskeletal: Negative for back pain and joint swelling.  Skin: Negative for color change and rash.  Neurological: Negative for dizziness, light-headedness and headaches.  Hematological: Negative for adenopathy. Does not bruise/bleed easily.  Psychiatric/Behavioral: Negative for dysphoric mood and agitation.       Objective:    Physical Exam  Constitutional: She is oriented to person, place, and time. She appears well-developed and well-nourished. No distress.  HENT:  Nose: Nose normal.  Mouth/Throat: Oropharynx is clear and moist.  Eyes: Right eye exhibits no discharge. Left eye exhibits no discharge. No scleral icterus.  Neck: Neck supple. No thyromegaly present.  Cardiovascular: Normal rate and regular rhythm.   Pulmonary/Chest: Breath sounds normal. No accessory muscle usage. No  tachypnea. No respiratory distress. She has no decreased breath sounds. She has no wheezes. She has no rhonchi. Right breast exhibits no inverted nipple, no mass, no nipple discharge and no tenderness (no axillary adenopathy). Left breast exhibits no inverted nipple, no mass, no nipple discharge and no tenderness (no axilarry adenopathy).  Abdominal: Soft. Bowel sounds are normal. There is no tenderness.  Musculoskeletal: She exhibits no edema or tenderness.  Lymphadenopathy:    She has no cervical adenopathy.  Neurological: She is alert and oriented to person, place, and time.  Skin: Skin is warm. No rash noted. No erythema.  Psychiatric: She has a normal mood and affect. Her behavior is normal.    BP 120/70 mmHg  Pulse 74  Temp(Src) 97.8 F (36.6 C) (Oral)  Ht 5' (1.524 m)  Wt 141 lb 4 oz (64.071 kg)  BMI 27.59 kg/m2  SpO2 95%  LMP 06/04/2000 Wt Readings from Last 3 Encounters:  12/11/14 141 lb 4 oz (64.071 kg)  02/24/14 144 lb 4 oz (65.431 kg)  08/22/13 139 lb 8 oz (63.277 kg)     Lab Results  Component Value Date   WBC 4.6 12/11/2014   HGB 13.1 12/11/2014   HCT 39.0 12/11/2014   PLT 250.0 12/11/2014   GLUCOSE 95 12/11/2014   CHOL 222* 12/11/2014   TRIG 153.0* 12/11/2014   HDL 54.40 12/11/2014   LDLDIRECT 135.3 11/28/2012   LDLCALC 137* 12/11/2014   ALT 20 12/11/2014   AST 22 12/11/2014   NA 138 12/11/2014   K 4.2 12/11/2014   CL 105 12/11/2014   CREATININE 0.72 12/11/2014   BUN 18 12/11/2014   CO2 26 12/11/2014   TSH 2.59 12/11/2014       Assessment & Plan:   Problem List Items Addressed This Visit    GERD (gastroesophageal reflux disease)    On zantac now.  Discussed diet adjustment.  She saw GI in f/u.  They changed her to H2 blocker.        Relevant Medications   ranitidine (ZANTAC) 150 MG capsule   Health care maintenance    Physical today (12/12/14 ).  PAP 06/04/12 negative with negative HPV.  Saw GI and planning for colonoscopy.   Keep on track with  mammograms.         Obstructive sleep apnea    CPAP.       Relevant Orders   Basic metabolic panel   Pure hypercholesterolemia    She is off crestor.  Wants to try diet and exercise before restarting.  Low cholesterol diet and exercise.  Follow lipid panel.       Relevant Orders   Lipid panel   Hepatic function panel   SOB (shortness of breath)    SOB with exertion.  EKG obtained and revealed SR with no acute ischemic changes.  Given symptoms and risk factors, will refer  to cardiology for further evaluation and question of need for any further cardiac testing.        Relevant Orders   EKG 12-Lead (Completed)   Ambulatory referral to Cardiology   Stress    Increased stress.  Planning to retire at the end of the year.  Does not feel needs anything more at this point.  Continues on zoloft.  Follow.         Other Visit Diagnoses    Routine general medical examination at a health care facility    -  Primary        Dale , MD

## 2014-12-13 ENCOUNTER — Encounter: Payer: Self-pay | Admitting: Internal Medicine

## 2014-12-13 DIAGNOSIS — Z0000 Encounter for general adult medical examination without abnormal findings: Secondary | ICD-10-CM | POA: Insufficient documentation

## 2014-12-13 DIAGNOSIS — R0602 Shortness of breath: Secondary | ICD-10-CM | POA: Insufficient documentation

## 2014-12-13 NOTE — Assessment & Plan Note (Signed)
CPAP.  

## 2014-12-13 NOTE — Assessment & Plan Note (Signed)
On zantac now.  Discussed diet adjustment.  She saw GI in f/u.  They changed her to H2 blocker.

## 2014-12-13 NOTE — Assessment & Plan Note (Signed)
She is off crestor.  Wants to try diet and exercise before restarting.  Low cholesterol diet and exercise.  Follow lipid panel.

## 2014-12-13 NOTE — Assessment & Plan Note (Signed)
SOB with exertion.  EKG obtained and revealed SR with no acute ischemic changes.  Given symptoms and risk factors, will refer to cardiology for further evaluation and question of need for any further cardiac testing.

## 2014-12-13 NOTE — Assessment & Plan Note (Signed)
Physical today (12/12/14 ).  PAP 06/04/12 negative with negative HPV.  Saw GI and planning for colonoscopy.   Keep on track with mammograms.

## 2014-12-13 NOTE — Assessment & Plan Note (Signed)
Increased stress.  Planning to retire at the end of the year.  Does not feel needs anything more at this point.  Continues on zoloft.  Follow.

## 2014-12-15 NOTE — Telephone Encounter (Signed)
Pt notified & will wait to hear from [place] about her cardiology appt. She would also prefer to reschedule her own colonoscopy based on her schedule. Pt notified she should be able to call herself to reschedule after she sees cardiology.

## 2014-12-15 NOTE — Telephone Encounter (Signed)
Please call and notify that GI does want to cancel her colonoscopy.  We will get this rescheduled after she sees cardiology.  I have told them to cancel.

## 2015-03-11 ENCOUNTER — Other Ambulatory Visit: Payer: BC Managed Care – PPO

## 2015-04-08 ENCOUNTER — Encounter: Payer: Self-pay | Admitting: Internal Medicine

## 2015-04-08 DIAGNOSIS — Z86010 Personal history of colonic polyps: Secondary | ICD-10-CM | POA: Insufficient documentation

## 2015-04-11 ENCOUNTER — Other Ambulatory Visit: Payer: Self-pay | Admitting: Internal Medicine

## 2015-04-14 NOTE — Telephone Encounter (Signed)
Please advise refill? 

## 2015-04-15 NOTE — Telephone Encounter (Signed)
ok'd zoloft #30 with 2 refills.   

## 2015-06-09 ENCOUNTER — Other Ambulatory Visit (INDEPENDENT_AMBULATORY_CARE_PROVIDER_SITE_OTHER): Payer: BC Managed Care – PPO

## 2015-06-09 DIAGNOSIS — E7800 Pure hypercholesterolemia, unspecified: Secondary | ICD-10-CM

## 2015-06-09 DIAGNOSIS — G4733 Obstructive sleep apnea (adult) (pediatric): Secondary | ICD-10-CM | POA: Diagnosis not present

## 2015-06-09 LAB — HEPATIC FUNCTION PANEL
ALT: 20 U/L (ref 0–35)
AST: 19 U/L (ref 0–37)
Albumin: 4.4 g/dL (ref 3.5–5.2)
Alkaline Phosphatase: 38 U/L — ABNORMAL LOW (ref 39–117)
Bilirubin, Direct: 0.1 mg/dL (ref 0.0–0.3)
Total Bilirubin: 0.5 mg/dL (ref 0.2–1.2)
Total Protein: 7 g/dL (ref 6.0–8.3)

## 2015-06-09 LAB — BASIC METABOLIC PANEL
BUN: 16 mg/dL (ref 6–23)
CO2: 25 mEq/L (ref 19–32)
Calcium: 9.6 mg/dL (ref 8.4–10.5)
Chloride: 107 mEq/L (ref 96–112)
Creatinine, Ser: 0.78 mg/dL (ref 0.40–1.20)
GFR: 79.19 mL/min (ref >60.00)
Glucose, Bld: 107 mg/dL — ABNORMAL HIGH (ref 70–99)
Potassium: 4.3 mEq/L (ref 3.5–5.1)
Sodium: 141 mEq/L (ref 135–145)

## 2015-06-09 LAB — LIPID PANEL
Cholesterol: 233 mg/dL — ABNORMAL HIGH (ref 0–200)
HDL: 62.3 mg/dL (ref >39.00)
LDL Cholesterol: 138 mg/dL — ABNORMAL HIGH (ref 0–99)
NonHDL: 170.82
Total CHOL/HDL Ratio: 4
Triglycerides: 163 mg/dL — ABNORMAL HIGH (ref 0.0–149.0)
VLDL: 32.6 mg/dL (ref 0.0–40.0)

## 2015-06-11 ENCOUNTER — Ambulatory Visit (INDEPENDENT_AMBULATORY_CARE_PROVIDER_SITE_OTHER): Payer: BC Managed Care – PPO | Admitting: Internal Medicine

## 2015-06-11 ENCOUNTER — Other Ambulatory Visit (HOSPITAL_COMMUNITY)
Admission: RE | Admit: 2015-06-11 | Discharge: 2015-06-11 | Disposition: A | Discharge status: Home / Self Care | Payer: BC Managed Care – PPO | Source: Ambulatory Visit | Attending: Internal Medicine | Admitting: Internal Medicine

## 2015-06-11 ENCOUNTER — Encounter: Payer: Self-pay | Admitting: Internal Medicine

## 2015-06-11 VITALS — BP 124/88 | HR 69 | Temp 98.4°F | Resp 18 | Ht 60.0 in | Wt 144.3 lb

## 2015-06-11 DIAGNOSIS — R739 Hyperglycemia, unspecified: Secondary | ICD-10-CM

## 2015-06-11 DIAGNOSIS — Z124 Encounter for screening for malignant neoplasm of cervix: Secondary | ICD-10-CM | POA: Diagnosis not present

## 2015-06-11 DIAGNOSIS — K219 Gastro-esophageal reflux disease without esophagitis: Secondary | ICD-10-CM

## 2015-06-11 DIAGNOSIS — E7800 Pure hypercholesterolemia, unspecified: Secondary | ICD-10-CM

## 2015-06-11 DIAGNOSIS — Z0000 Encounter for general adult medical examination without abnormal findings: Secondary | ICD-10-CM

## 2015-06-11 DIAGNOSIS — Z1151 Encounter for screening for human papillomavirus (HPV): Secondary | ICD-10-CM | POA: Insufficient documentation

## 2015-06-11 DIAGNOSIS — Z01419 Encounter for gynecological examination (general) (routine) without abnormal findings: Secondary | ICD-10-CM | POA: Diagnosis present

## 2015-06-11 DIAGNOSIS — F439 Reaction to severe stress, unspecified: Secondary | ICD-10-CM

## 2015-06-11 DIAGNOSIS — Z86010 Personal history of colonic polyps: Secondary | ICD-10-CM

## 2015-06-11 DIAGNOSIS — G4733 Obstructive sleep apnea (adult) (pediatric): Secondary | ICD-10-CM

## 2015-06-11 DIAGNOSIS — Z658 Other specified problems related to psychosocial circumstances: Secondary | ICD-10-CM

## 2015-06-11 DIAGNOSIS — Z1239 Encounter for other screening for malignant neoplasm of breast: Secondary | ICD-10-CM | POA: Diagnosis not present

## 2015-06-11 MED ORDER — ROSUVASTATIN CALCIUM 5 MG PO TABS: 5.0000 mg | ORAL_TABLET | Freq: Every day | ORAL | Status: DC

## 2015-06-11 NOTE — Assessment & Plan Note (Addendum)
PAP today 06/11/15.  Schedule mammogram - overdue.  Colonoscopy 02/03/15 - hyperplastic polyp.  Recommend f/u colonoscopy 01/2020.

## 2015-06-11 NOTE — Progress Notes (Signed)
Pre visit review using our clinic review tool, if applicable. No additional management support is needed unless otherwise documented below in the visit note. 

## 2015-06-11 NOTE — Progress Notes (Signed)
Patient ID: Glennie HawkJudy E Talbot, female   DOB: 12/23/1950, 65 y.o.   MRN: [id number]   Subjective:    Patient ID: Glennie HawkJudy E Sapp, female    DOB: 11/23/1950, 65 y.o.   MRN: [id number]  HPI  Patient with past history of hypercholesterolemia, GERD and sleep apnea.  She comes in for a scheduled follow up.  Thought due physical.  Had her physical in 11/2014.  PAP 05/2012.  Pap today.  She is seeing podiatry.  Has morton's neuroma.  Was given lodine and is s/p injection.  Does reports some acid reflux.  Discussed lodine may aggravate. Also reports some left hip bursitis.  Not a significant issue for her now.  Tries to stay active.  No cardiac symptoms with increased activity or exertion.  No sob.  No abdominal pain or cramping.  No urine change.  Bowels stable.     Past Medical History  Diagnosis Date  . Hypercholesterolemia   . Esophagitis   . Gastritis     EGD (hiatal hernia)  . Chronic constipation   . GERD (gastroesophageal reflux disease)   . Vitamin D deficiency   . Sleep apnea     CPAP   Past Surgical History  Procedure Laterality Date  . Appendectomy  1976   Family History  Problem Relation Age of Onset  . Colon cancer Father   . Colon cancer Mother   . COPD Mother   . Heart disease Mother   . Breast cancer Sister     died age 64  . Breast cancer      paternal aunts   Social History   Social History  . Marital Status: Married    Spouse Name: N/A  . Number of Children: 2  . Years of Education: N/A   Social History Main Topics  . Smoking status: Never Smoker   . Smokeless tobacco: Never Used  . Alcohol Use: 0.0 oz/week    0 Standard drinks or equivalent per week     Comment: occasional  . Drug Use: No  . Sexual Activity: Not Asked   Other Topics Concern  . None   Social History Narrative    Outpatient Encounter Prescriptions as of 06/11/2015  Medication Sig  . conjugated estrogens (PREMARIN) vaginal cream Place vaginally as needed.  . Cyanocobalamin (RA  VITAMIN B-12 TR) 1000 MCG TBCR Take by mouth.  . ranitidine (ZANTAC) 150 MG capsule TAKE 1 CAPSULE (150 MG TOTAL) BY MOUTH NIGHTLY.  . sertraline (ZOLOFT) 100 MG tablet TAKE 1 TABLET (100 MG TOTAL) BY MOUTH DAILY. (Patient taking differently: TAKE 1/2 to 1 TABLET (100 MG TOTAL) BY MOUTH DAILY.)  . VITAMIN D, CHOLECALCIFEROL, PO Take 4,000 Units by mouth daily.   . rosuvastatin (CRESTOR) 5 MG tablet Take 1 tablet (5 mg total) by mouth daily.  . [DISCONTINUED] lansoprazole (PREVACID) 30 MG capsule TAKE 1 CAPSULE BY MOUTH TWICE A DAY (Patient taking differently: TAKE 1 CAPSULE BY MOUTH TWICE A DAY AS NEEDED)  . [DISCONTINUED] rosuvastatin (CRESTOR) 5 MG tablet Take 1 tablet (5 mg total) by mouth daily. (Patient not taking: Reported on 12/11/2014)   No facility-administered encounter medications on file as of 06/11/2015.    Review of Systems  Constitutional: Negative for appetite change and unexpected weight change.  HENT: Negative for congestion and sinus pressure.   Eyes: Negative for pain and visual disturbance.  Respiratory: Negative for cough, chest tightness and shortness of breath.   Cardiovascular: Negative for chest pain, palpitations and leg swelling.  Gastrointestinal: Negative for nausea, vomiting, abdominal pain and diarrhea.  Genitourinary: Negative for dysuria and difficulty urinating.  Musculoskeletal: Negative for back pain and joint swelling.       Left hip bursitis.   Skin: Negative for color change and rash.  Neurological: Negative for dizziness and headaches.  Hematological: Negative for adenopathy. Does not bruise/bleed easily.  Psychiatric/Behavioral: Negative for dysphoric mood and agitation.       Objective:     Blood pressure rechecked by me:  114/78  Physical Exam  Constitutional: She is oriented to person, place, and time. She appears well-developed and well-nourished. No distress.  HENT:  Nose: Nose normal.  Mouth/Throat: Oropharynx is clear and moist.    Eyes: Right eye exhibits no discharge. Left eye exhibits no discharge. No scleral icterus.  Neck: Neck supple. No thyromegaly present.  Cardiovascular: Normal rate and regular rhythm.   Pulmonary/Chest: Breath sounds normal. No accessory muscle usage. No tachypnea. No respiratory distress. She has no decreased breath sounds. She has no wheezes. She has no rhonchi. Right breast exhibits no inverted nipple, no mass, no nipple discharge and no tenderness (no axillary adenopathy). Left breast exhibits no inverted nipple, no mass, no nipple discharge and no tenderness (no axilarry adenopathy).  Abdominal: Soft. Bowel sounds are normal. There is no tenderness.  Genitourinary:  Normal external genitalia.  Vaginal vault without lesions.  Cervix identified.  Pap smear performed.  Could not appreciate any adnexal masses or tenderness.    Musculoskeletal: She exhibits no edema or tenderness.  Lymphadenopathy:    She has no cervical adenopathy.  Neurological: She is alert and oriented to person, place, and time.  Skin: Skin is warm. No rash noted. No erythema.  Psychiatric: She has a normal mood and affect. Her behavior is normal.    BP 124/88 mmHg  Pulse 69  Temp(Src) 98.4 F (36.9 C) (Oral)  Resp 18  Ht 5' (1.524 m)  Wt 144 lb 4.8 oz (65.454 kg)  BMI 28.18 kg/m2  SpO2 97%  LMP 06/04/2000 Wt Readings from Last 3 Encounters:  06/11/15 144 lb 4.8 oz (65.454 kg)  12/11/14 141 lb 4 oz (64.071 kg)  02/24/14 144 lb 4 oz (65.431 kg)     Lab Results  Component Value Date   WBC 4.6 12/11/2014   HGB 13.1 12/11/2014   HCT 39.0 12/11/2014   PLT 250.0 12/11/2014   GLUCOSE 107* 06/09/2015   CHOL 233* 06/09/2015   TRIG 163.0* 06/09/2015   HDL 62.30 06/09/2015   LDLDIRECT 135.3 11/28/2012   LDLCALC 138* 06/09/2015   ALT 20 06/09/2015   AST 19 06/09/2015   NA 141 06/09/2015   K 4.3 06/09/2015   CL 107 06/09/2015   CREATININE 0.78 06/09/2015   BUN 16 06/09/2015   CO2 25 06/09/2015   TSH 2.59  12/11/2014        Assessment & Plan:   Problem List Items Addressed This Visit    GERD (gastroesophageal reflux disease)    On ranitidine.  Increased acid.  Can increase to bid.  Recently saw GI.  Had colonoscopy.  Did not feel EGD warranted.  Follow.        Health care maintenance    PAP today 06/11/15.  Schedule mammogram - overdue.  Colonoscopy 02/03/15 - hyperplastic polyp.  Recommend f/u colonoscopy 01/2020.        History of colonic polyps    Colonoscopy 02/03/15 - hyperplastic polyp.  Recommended f/u in 01/2020.  Obstructive sleep apnea    CPAP.       Pure hypercholesterolemia    Low cholesterol diet and exercise.  Follow lipid panel and liver function tests.  Start crestor.        Relevant Medications   rosuvastatin (CRESTOR) 5 MG tablet   Stress    Handling stress well.  On zoloft.  Follow.         Other Visit Diagnoses    Pap smear for cervical cancer screening    -  Primary    Relevant Orders    Cytology - PAP    Hyperglycemia        Relevant Orders    Hemoglobin A1c    Glucose, fasting    Breast cancer screening        Relevant Orders    MM DIGITAL SCREENING BILATERAL        Dale Graves, MD

## 2015-06-11 NOTE — Patient Instructions (Signed)
Zantac (ranitidine) 150mg  - take one tablet 30 minutes before breakfast and one tablet 30 minutes before your evening meal.    Take crestor 5mg  - on Monday, Wednesday and Friday.

## 2015-06-14 ENCOUNTER — Encounter: Payer: Self-pay | Admitting: Internal Medicine

## 2015-06-14 NOTE — Assessment & Plan Note (Addendum)
Low cholesterol diet and exercise.  Follow lipid panel and liver function tests. Start crestor.   

## 2015-06-14 NOTE — Assessment & Plan Note (Signed)
On ranitidine.  Increased acid.  Can increase to bid.  Recently saw GI.  Had colonoscopy.  Did not feel EGD warranted.  Follow.

## 2015-06-14 NOTE — Assessment & Plan Note (Signed)
Handling stress well.  On zoloft.  Follow.

## 2015-06-14 NOTE — Assessment & Plan Note (Signed)
Colonoscopy 02/03/15 - hyperplastic polyp.  Recommended f/u in 01/2020.

## 2015-06-14 NOTE — Assessment & Plan Note (Signed)
CPAP.  

## 2015-06-16 LAB — CYTOLOGY - PAP

## 2015-06-19 ENCOUNTER — Encounter: Payer: Self-pay | Admitting: *Deleted

## 2015-06-19 ENCOUNTER — Ambulatory Visit
Admission: RE | Admit: 2015-06-19 | Discharge: 2015-06-19 | Disposition: A | Discharge status: Home / Self Care | Payer: BC Managed Care – PPO | Source: Ambulatory Visit | Attending: Internal Medicine | Admitting: Internal Medicine

## 2015-06-19 ENCOUNTER — Other Ambulatory Visit: Payer: Self-pay | Admitting: Internal Medicine

## 2015-06-19 DIAGNOSIS — Z1231 Encounter for screening mammogram for malignant neoplasm of breast: Secondary | ICD-10-CM | POA: Insufficient documentation

## 2015-06-19 DIAGNOSIS — Z1239 Encounter for other screening for malignant neoplasm of breast: Secondary | ICD-10-CM

## 2015-06-23 ENCOUNTER — Telehealth: Payer: Self-pay

## 2015-06-23 NOTE — Telephone Encounter (Signed)
Notified pt of her test results, pt verbalized understanding. Pt was also sent a copy in the mail on 06/19/15

## 2015-06-23 NOTE — Telephone Encounter (Signed)
Pt called for test result. [id number]

## 2015-06-30 ENCOUNTER — Other Ambulatory Visit: Payer: Self-pay | Admitting: Internal Medicine

## 2015-08-24 ENCOUNTER — Other Ambulatory Visit: Payer: Self-pay | Admitting: Internal Medicine

## 2015-08-26 ENCOUNTER — Other Ambulatory Visit: Payer: BC Managed Care – PPO

## 2015-09-01 ENCOUNTER — Ambulatory Visit: Payer: BC Managed Care – PPO | Admitting: Internal Medicine

## 2015-10-23 ENCOUNTER — Other Ambulatory Visit (INDEPENDENT_AMBULATORY_CARE_PROVIDER_SITE_OTHER): Payer: BC Managed Care – PPO

## 2015-10-23 DIAGNOSIS — R739 Hyperglycemia, unspecified: Secondary | ICD-10-CM

## 2015-10-23 LAB — HEMOGLOBIN A1C: Hgb A1c MFr Bld: 5.9 % (ref 4.6–6.5)

## 2015-10-24 LAB — GLUCOSE, FASTING: Glucose, Fasting: 109 mg/dL — ABNORMAL HIGH (ref 65–99)

## 2015-10-25 ENCOUNTER — Encounter: Payer: Self-pay | Admitting: Internal Medicine

## 2015-11-02 ENCOUNTER — Encounter: Payer: Self-pay | Admitting: Internal Medicine

## 2015-11-02 ENCOUNTER — Ambulatory Visit (INDEPENDENT_AMBULATORY_CARE_PROVIDER_SITE_OTHER): Payer: BC Managed Care – PPO | Admitting: Internal Medicine

## 2015-11-02 VITALS — BP 116/72 | HR 82 | Temp 98.5°F | Resp 14 | Wt 143.0 lb

## 2015-11-02 DIAGNOSIS — E7800 Pure hypercholesterolemia, unspecified: Secondary | ICD-10-CM | POA: Diagnosis not present

## 2015-11-02 DIAGNOSIS — K219 Gastro-esophageal reflux disease without esophagitis: Secondary | ICD-10-CM | POA: Diagnosis not present

## 2015-11-02 DIAGNOSIS — R739 Hyperglycemia, unspecified: Secondary | ICD-10-CM | POA: Diagnosis not present

## 2015-11-02 DIAGNOSIS — F439 Reaction to severe stress, unspecified: Secondary | ICD-10-CM

## 2015-11-02 DIAGNOSIS — Z658 Other specified problems related to psychosocial circumstances: Secondary | ICD-10-CM

## 2015-11-02 DIAGNOSIS — G4733 Obstructive sleep apnea (adult) (pediatric): Secondary | ICD-10-CM

## 2015-11-02 NOTE — Progress Notes (Signed)
Pre visit review using our clinic review tool, if applicable. No additional management support is needed unless otherwise documented below in the visit note. 

## 2015-11-02 NOTE — Progress Notes (Signed)
Patient ID: Jessica Peck, female   DOB: [birth year], 65 y.o.   MRN: [id number]   Subjective:    Patient ID: Jessica Peck, female    DOB: 1951/02/22, 65 y.o.   MRN: [id number]  HPI  Patient here for a scheduled follow up.  States she is doing well.  Just retired.  Feels better.  Decreased stress.  Discussed diet and exercise.  She plans to start exercising more.  No chest pain.  No sob.  No abdominal pain or cramping.  She does report some intermittent acid reflux.  Taking zantac daily.  Will occasionally need two per day.  May take two per day a couple of times per week.  She plans to adjust her diet, exercise and lose some weight.  Bowels stable.  Some increased stress with her daughter's medical issues.  Doing better now.  Handling things relatively well.  Discussed lab results.  She is off crestor.     Past Medical History:  Diagnosis Date  . Chronic constipation   . Esophagitis   . Gastritis    EGD (hiatal hernia)  . GERD (gastroesophageal reflux disease)   . Hypercholesterolemia   . Sleep apnea    CPAP  . Vitamin D deficiency    Past Surgical History:  Procedure Laterality Date  . APPENDECTOMY  1976   Family History  Problem Relation Age of Onset  . Colon cancer Father   . Colon cancer Mother   . COPD Mother   . Heart disease Mother   . Breast cancer Sister     died age 33  . Breast cancer      paternal aunts   Social History   Social History  . Marital status: Married    Spouse name: N/A  . Number of children: 2  . Years of education: N/A   Social History Main Topics  . Smoking status: Never Smoker  . Smokeless tobacco: Never Used  . Alcohol use 0.0 oz/week     Comment: occasional  . Drug use: No  . Sexual activity: Not Asked   Other Topics Concern  . None   Social History Narrative  . None    Outpatient Encounter Prescriptions as of 11/02/2015  Medication Sig  . conjugated estrogens (PREMARIN) vaginal cream Place vaginally as needed.  .  Cyanocobalamin (RA VITAMIN B-12 TR) 1000 MCG TBCR Take by mouth.  . lansoprazole (PREVACID) 30 MG capsule TAKE 1 CAPSULE BY MOUTH TWICE A DAY  . ranitidine (ZANTAC) 150 MG capsule TAKE 1 CAPSULE (150 MG TOTAL) BY MOUTH NIGHTLY.  . sertraline (ZOLOFT) 100 MG tablet TAKE 1 TABLET (100 MG TOTAL) BY MOUTH DAILY.  Marland Kitchen VITAMIN D, CHOLECALCIFEROL, PO Take 4,000 Units by mouth daily.   . rosuvastatin (CRESTOR) 5 MG tablet Take 1 tablet (5 mg total) by mouth daily. (Patient not taking: Reported on 11/02/2015)   No facility-administered encounter medications on file as of 11/02/2015.     Review of Systems  Constitutional: Negative for appetite change and unexpected weight change.  HENT: Negative for congestion and sinus pressure.   Respiratory: Negative for cough, chest tightness and shortness of breath.   Cardiovascular: Negative for chest pain, palpitations and leg swelling.  Gastrointestinal: Negative for abdominal pain, diarrhea, nausea and vomiting.       Acid reflux as outlined.   Musculoskeletal: Negative for back pain and joint swelling.  Skin: Negative for color change and rash.  Neurological: Negative for dizziness, light-headedness and headaches.  Psychiatric/Behavioral: Negative  for agitation and dysphoric mood.       Increased stress as outlined.  Is better.         Objective:    Physical Exam  Constitutional: She appears well-developed and well-nourished. No distress.  HENT:  Nose: Nose normal.  Mouth/Throat: Oropharynx is clear and moist.  Neck: Neck supple. No thyromegaly present.  Cardiovascular: Normal rate and regular rhythm.   Pulmonary/Chest: Breath sounds normal. No respiratory distress. She has no wheezes.  Abdominal: Soft. Bowel sounds are normal. There is no tenderness.  Musculoskeletal: She exhibits no edema or tenderness.  Lymphadenopathy:    She has no cervical adenopathy.  Skin: No rash noted. No erythema.  Psychiatric: She has a normal mood and affect. Her  behavior is normal.    BP 116/72   Pulse 82   Temp 98.5 F (36.9 C)   Resp 14   Wt 143 lb (64.9 kg)   LMP 06/04/2000   BMI 27.93 kg/m  Wt Readings from Last 3 Encounters:  11/02/15 143 lb (64.9 kg)  06/11/15 144 lb 4.8 oz (65.5 kg)  12/11/14 141 lb 4 oz (64.1 kg)     Lab Results  Component Value Date   WBC 4.6 12/11/2014   HGB 13.1 12/11/2014   HCT 39.0 12/11/2014   PLT 250.0 12/11/2014   GLUCOSE 107 (H) 06/09/2015   CHOL 233 (H) 06/09/2015   TRIG 163.0 (H) 06/09/2015   HDL 62.30 06/09/2015   LDLDIRECT 135.3 11/28/2012   LDLCALC 138 (H) 06/09/2015   ALT 20 06/09/2015   AST 19 06/09/2015   NA 141 06/09/2015   K 4.3 06/09/2015   CL 107 06/09/2015   CREATININE 0.78 06/09/2015   BUN 16 06/09/2015   CO2 25 06/09/2015   TSH 2.59 12/11/2014   HGBA1C 5.9 10/23/2015       Assessment & Plan:   Problem List Items Addressed This Visit    GERD (gastroesophageal reflux disease)    Saw GI.  Did not feel EGD warranted.  On zantac.  Discussed goal = to be symptom free.  Can increased zantac to bid.  Follow.  Notify me if symptoms persist or worsen.  Diet adjustment - may help.  Follow.       Obstructive sleep apnea    CPAP.      Pure hypercholesterolemia    She is going to adjust her diet and start exercising.  Off crestor. Will hold on restarting.  See what she can do with diet and exercise.  Follow lipid panel.  Will restart if cholesterol elevated.       Relevant Orders   CBC with Differential/Platelet   TSH   Lipid panel   Hepatic function panel   Basic metabolic panel   Stress    On zoloft.  Stress is better.  Follow.        Other Visit Diagnoses    Hyperglycemia    -  Primary   Relevant Orders   Hemoglobin A1c       Dale DurhamSCOTT, Britta Louth, MD

## 2015-11-03 ENCOUNTER — Encounter: Payer: Self-pay | Admitting: Internal Medicine

## 2015-11-03 NOTE — Assessment & Plan Note (Signed)
She is going to adjust her diet and start exercising.  Off crestor. Will hold on restarting.  See what she can do with diet and exercise.  Follow lipid panel.  Will restart if cholesterol elevated.

## 2015-11-03 NOTE — Assessment & Plan Note (Signed)
Saw GI.  Did not feel EGD warranted.  On zantac.  Discussed goal = to be symptom free.  Can increased zantac to bid.  Follow.  Notify me if symptoms persist or worsen.  Diet adjustment - may help.  Follow.

## 2015-11-03 NOTE — Assessment & Plan Note (Signed)
CPAP.  

## 2015-11-03 NOTE — Assessment & Plan Note (Signed)
On zoloft.  Stress is better.  Follow.

## 2015-12-10 ENCOUNTER — Telehealth: Payer: Self-pay | Admitting: *Deleted

## 2015-12-10 MED ORDER — RANITIDINE HCL 150 MG PO CAPS: ORAL_CAPSULE | ORAL | 11 refills | Status: DC

## 2015-12-10 NOTE — Telephone Encounter (Signed)
Patient requeted a medication refill for ranitidine Pharmacy [facility] [address]

## 2015-12-10 NOTE — Telephone Encounter (Signed)
Refilled rx

## 2016-02-23 ENCOUNTER — Other Ambulatory Visit: Payer: Self-pay | Admitting: Internal Medicine

## 2016-03-07 ENCOUNTER — Other Ambulatory Visit (INDEPENDENT_AMBULATORY_CARE_PROVIDER_SITE_OTHER): Payer: BC Managed Care – PPO

## 2016-03-07 DIAGNOSIS — R739 Hyperglycemia, unspecified: Secondary | ICD-10-CM

## 2016-03-07 DIAGNOSIS — E7800 Pure hypercholesterolemia, unspecified: Secondary | ICD-10-CM | POA: Diagnosis not present

## 2016-03-07 LAB — HEPATIC FUNCTION PANEL
ALT: 22 U/L (ref 0–35)
AST: 20 U/L (ref 0–37)
Albumin: 4.5 g/dL (ref 3.5–5.2)
Alkaline Phosphatase: 41 U/L (ref 39–117)
Bilirubin, Direct: 0.1 mg/dL (ref 0.0–0.3)
Total Bilirubin: 0.6 mg/dL (ref 0.2–1.2)
Total Protein: 6.9 g/dL (ref 6.0–8.3)

## 2016-03-07 LAB — BASIC METABOLIC PANEL
BUN: 15 mg/dL (ref 6–23)
CO2: 27 mEq/L (ref 19–32)
Calcium: 10 mg/dL (ref 8.4–10.5)
Chloride: 106 mEq/L (ref 96–112)
Creatinine, Ser: 0.84 mg/dL (ref 0.40–1.20)
GFR: 72.53 mL/min (ref >60.00)
Glucose, Bld: 112 mg/dL — ABNORMAL HIGH (ref 70–99)
Potassium: 4.4 mEq/L (ref 3.5–5.1)
Sodium: 141 mEq/L (ref 135–145)

## 2016-03-07 LAB — CBC WITH DIFFERENTIAL/PLATELET
Basophils Absolute: 0 10*3/uL (ref 0.0–0.1)
Basophils Relative: 0.6 % (ref 0.0–3.0)
Eosinophils Absolute: 0.2 10*3/uL (ref 0.0–0.7)
Eosinophils Relative: 3.9 % (ref 0.0–5.0)
HCT: 39.6 % (ref 36.0–46.0)
Hemoglobin: 13.4 g/dL (ref 12.0–15.0)
Lymphocytes Relative: 54.1 % — ABNORMAL HIGH (ref 12.0–46.0)
Lymphs Abs: 2.5 10*3/uL (ref 0.7–4.0)
MCHC: 34 g/dL (ref 30.0–36.0)
MCV: 88.3 fl (ref 78.0–100.0)
Monocytes Absolute: 0.3 10*3/uL (ref 0.1–1.0)
Monocytes Relative: 5.6 % (ref 3.0–12.0)
Neutro Abs: 1.7 10*3/uL (ref 1.4–7.7)
Neutrophils Relative %: 35.8 % — ABNORMAL LOW (ref 43.0–77.0)
Platelets: 271 10*3/uL (ref 150.0–400.0)
RBC: 4.48 Mil/uL (ref 3.87–5.11)
RDW: 13.5 % (ref 11.5–15.5)
WBC: 4.7 10*3/uL (ref 4.0–10.5)

## 2016-03-07 LAB — LIPID PANEL
Cholesterol: 247 mg/dL — ABNORMAL HIGH (ref 0–200)
HDL: 55.8 mg/dL (ref >39.00)
LDL Cholesterol: 152 mg/dL — ABNORMAL HIGH (ref 0–99)
NonHDL: 191.62
Total CHOL/HDL Ratio: 4
Triglycerides: 197 mg/dL — ABNORMAL HIGH (ref 0.0–149.0)
VLDL: 39.4 mg/dL (ref 0.0–40.0)

## 2016-03-07 LAB — HEMOGLOBIN A1C: Hgb A1c MFr Bld: 5.9 % (ref 4.6–6.5)

## 2016-03-07 LAB — TSH: TSH: 2.56 u[IU]/mL (ref 0.35–4.50)

## 2016-03-08 ENCOUNTER — Encounter: Payer: Self-pay | Admitting: Internal Medicine

## 2016-03-11 ENCOUNTER — Encounter: Payer: Self-pay | Admitting: Internal Medicine

## 2016-03-11 ENCOUNTER — Ambulatory Visit (INDEPENDENT_AMBULATORY_CARE_PROVIDER_SITE_OTHER): Payer: BC Managed Care – PPO | Admitting: Internal Medicine

## 2016-03-11 VITALS — BP 128/86 | HR 75 | Temp 98.2°F | Ht 60.0 in | Wt 139.4 lb

## 2016-03-11 DIAGNOSIS — R0602 Shortness of breath: Secondary | ICD-10-CM | POA: Diagnosis not present

## 2016-03-11 DIAGNOSIS — E7800 Pure hypercholesterolemia, unspecified: Secondary | ICD-10-CM

## 2016-03-11 DIAGNOSIS — G4733 Obstructive sleep apnea (adult) (pediatric): Secondary | ICD-10-CM

## 2016-03-11 DIAGNOSIS — F439 Reaction to severe stress, unspecified: Secondary | ICD-10-CM | POA: Diagnosis not present

## 2016-03-11 DIAGNOSIS — R079 Chest pain, unspecified: Secondary | ICD-10-CM | POA: Diagnosis not present

## 2016-03-11 DIAGNOSIS — K219 Gastro-esophageal reflux disease without esophagitis: Secondary | ICD-10-CM

## 2016-03-11 MED ORDER — ROSUVASTATIN CALCIUM 5 MG PO TABS: 5.0000 mg | ORAL_TABLET | Freq: Every day | ORAL | 2 refills | Status: DC

## 2016-03-11 NOTE — Progress Notes (Signed)
Pre visit review using our clinic review tool, if applicable. No additional management support is needed unless otherwise documented below in the visit note. 

## 2016-03-11 NOTE — Progress Notes (Signed)
Patient ID: Jessica Peck, female   DOB: 12/12/1950, 65 y.o.   MRN: [id number]   Subjective:    Patient ID: Jessica HawkJudy E Peck, female    DOB: 04/29/1950, 65 y.o.   MRN: [id number]  HPI  Patient here for a scheduled follow up.  She reports she is doing relatively well.  Retired. Her husband plans to retire soon.  Decreased stress since retiring.  Her daughter is doing ok.  She is still having some persistent problems with acid reflux despite taking medications.  Tries to watch what she eats.  Does help.  Cholesterol elevated.  LDL 152.  Increased.  She is not on cholesterol medications.  Tries crestor.  Felt some minimal aching.  Not sure if related.  Discussed a trial of another cholesterol medication.  She prefers to try the low dose of crestor as we discussed.  No abdominal pain or cramping.  Bowels stable.  Discussed other labs.  Discussed a1c results.  She request referral to genetic counselor.  Has noticed some discomfort in her jaw.  Notices occasionally.  Not on a regular basis.  Some sob with some exertion, but she relates this to being out of shape and not exercising regularly.  Saw cardiology.  Had stress test - negative for ischemia.     Past Medical History:  Diagnosis Date  . Chronic constipation   . Esophagitis   . Gastritis    EGD (hiatal hernia)  . GERD (gastroesophageal reflux disease)   . Hypercholesterolemia   . Sleep apnea    CPAP  . Vitamin D deficiency    Past Surgical History:  Procedure Laterality Date  . APPENDECTOMY  1976   Family History  Problem Relation Age of Onset  . Colon cancer Father   . Colon cancer Mother   . COPD Mother   . Heart disease Mother   . Breast cancer Sister     died age 64  . Breast cancer      paternal aunts   Social History   Social History  . Marital status: Married    Spouse name: N/A  . Number of children: 2  . Years of education: N/A   Social History Main Topics  . Smoking status: Never Smoker  . Smokeless  tobacco: Never Used  . Alcohol use 0.0 oz/week     Comment: occasional  . Drug use: No  . Sexual activity: Not Asked   Other Topics Concern  . None   Social History Narrative  . None    Outpatient Encounter Prescriptions as of 03/11/2016  Medication Sig  . conjugated estrogens (PREMARIN) vaginal cream Place vaginally as needed.  . Cyanocobalamin (RA VITAMIN B-12 TR) 1000 MCG TBCR Take by mouth.  . ranitidine (ZANTAC) 150 MG capsule TAKE 1 CAPSULE (150 MG TOTAL) BY MOUTH NIGHTLY.  . sertraline (ZOLOFT) 100 MG tablet TAKE 1 TABLET (100 MG TOTAL) BY MOUTH DAILY.  Marland Kitchen. VITAMIN D, CHOLECALCIFEROL, PO Take 4,000 Units by mouth daily.   . [DISCONTINUED] lansoprazole (PREVACID) 30 MG capsule TAKE 1 CAPSULE BY MOUTH TWICE A DAY  . rosuvastatin (CRESTOR) 5 MG tablet Take 1 tablet (5 mg total) by mouth daily.  . [DISCONTINUED] rosuvastatin (CRESTOR) 5 MG tablet Take 1 tablet (5 mg total) by mouth daily. (Patient not taking: Reported on 03/11/2016)   No facility-administered encounter medications on file as of 03/11/2016.     Review of Systems  Constitutional: Negative for appetite change and unexpected weight change.  HENT: Negative for  congestion and sinus pressure.   Respiratory: Negative for cough and chest tightness.        Some occasional sob with exertion.  She relates to being out of shape and not exercising regularly.    Cardiovascular: Negative for chest pain, palpitations and leg swelling.  Gastrointestinal: Negative for abdominal pain, diarrhea, nausea and vomiting.  Genitourinary: Negative for difficulty urinating and dysuria.  Musculoskeletal: Negative for back pain and joint swelling.  Skin: Negative for color change and rash.  Neurological: Negative for dizziness, light-headedness and headaches.  Psychiatric/Behavioral: Negative for agitation and dysphoric mood.       Objective:    Physical Exam  Constitutional: She appears well-developed and well-nourished. No distress.   HENT:  Nose: Nose normal.  Mouth/Throat: Oropharynx is clear and moist.  Neck: Neck supple. No thyromegaly present.  Cardiovascular: Normal rate and regular rhythm.   Pulmonary/Chest: Breath sounds normal. No respiratory distress. She has no wheezes.  Abdominal: Soft. Bowel sounds are normal. There is no tenderness.  Musculoskeletal: She exhibits no edema or tenderness.  Lymphadenopathy:    She has no cervical adenopathy.  Skin: No rash noted. No erythema.  Psychiatric: She has a normal mood and affect. Her behavior is normal.    BP 128/86   Pulse 75   Temp 98.2 F (36.8 C) (Oral)   Ht 5' (1.524 m)   Wt 139 lb 6.4 oz (63.2 kg)   LMP 06/04/2000   SpO2 97%   BMI 27.22 kg/m  Wt Readings from Last 3 Encounters:  03/11/16 139 lb 6.4 oz (63.2 kg)  11/02/15 143 lb (64.9 kg)  06/11/15 144 lb 4.8 oz (65.5 kg)     Lab Results  Component Value Date   WBC 4.7 03/07/2016   HGB 13.4 03/07/2016   HCT 39.6 03/07/2016   PLT 271.0 03/07/2016   GLUCOSE 112 (H) 03/07/2016   CHOL 247 (H) 03/07/2016   TRIG 197.0 (H) 03/07/2016   HDL 55.80 03/07/2016   LDLDIRECT 135.3 11/28/2012   LDLCALC 152 (H) 03/07/2016   ALT 22 03/07/2016   AST 20 03/07/2016   NA 141 03/07/2016   K 4.4 03/07/2016   CL 106 03/07/2016   CREATININE 0.84 03/07/2016   BUN 15 03/07/2016   CO2 27 03/07/2016   TSH 2.56 03/07/2016   HGBA1C 5.9 03/07/2016       Assessment & Plan:   Problem List Items Addressed This Visit    GERD (gastroesophageal reflux disease)    Saw GI.  Persistent reflux despite medication.  Will have GI evaluate.  Follow.  Question of need for f/u EGD.        Relevant Orders   Ambulatory referral to Gastroenterology   Obstructive sleep apnea    CPAP.       Pure hypercholesterolemia - Primary    Low cholesterol diet and exercise.  Follow lipid panel.  LDL 152.  Will start low dose crestor.  Have her take 5mg  q Monday, Wednesday and Friday.  Increased as tolerated.  Follow lipid panel  and liver function tests.        Relevant Medications   rosuvastatin (CRESTOR) 5 MG tablet   Other Relevant Orders   Hepatic function panel   SOB (shortness of breath)    Had EKG today that revealed SR with no acute ischemic changes.  Discussed further cardac w/up.  Saw cardiology last year.  Had negative stress test.  Does not feel symptoms are any different.  She relates symptoms to not  exercising and being out of shape.  Hold on further cardiac w/up.  Follow closely.        Stress    On zoloft.  Better.  Follow.        Other Visit Diagnoses    Chest pain, unspecified type       Relevant Orders   EKG 12-Lead       Dale DurhamSCOTT, Latiqua Daloia, MD

## 2016-03-13 ENCOUNTER — Encounter: Payer: Self-pay | Admitting: Internal Medicine

## 2016-03-13 NOTE — Assessment & Plan Note (Signed)
On zoloft.  Better.  Follow.

## 2016-03-13 NOTE — Assessment & Plan Note (Signed)
Low cholesterol diet and exercise.  Follow lipid panel.  LDL 152.  Will start low dose crestor.  Have her take 5mg  q Monday, Wednesday and Friday.  Increased as tolerated.  Follow lipid panel and liver function tests.

## 2016-03-13 NOTE — Assessment & Plan Note (Signed)
CPAP.  

## 2016-03-13 NOTE — Assessment & Plan Note (Signed)
Saw GI.  Persistent reflux despite medication.  Will have GI evaluate.  Follow.  Question of need for f/u EGD.

## 2016-03-13 NOTE — Assessment & Plan Note (Signed)
Had EKG today that revealed SR with no acute ischemic changes.  Discussed further cardac w/up.  Saw cardiology last year.  Had negative stress test.  Does not feel symptoms are any different.  She relates symptoms to not exercising and being out of shape.  Hold on further cardiac w/up.  Follow closely.

## 2016-04-22 ENCOUNTER — Other Ambulatory Visit (INDEPENDENT_AMBULATORY_CARE_PROVIDER_SITE_OTHER): Payer: BC Managed Care – PPO

## 2016-04-22 DIAGNOSIS — E7800 Pure hypercholesterolemia, unspecified: Secondary | ICD-10-CM

## 2016-04-22 LAB — HEPATIC FUNCTION PANEL
ALT: 20 U/L (ref 0–35)
AST: 18 U/L (ref 0–37)
Albumin: 4.8 g/dL (ref 3.5–5.2)
Alkaline Phosphatase: 43 U/L (ref 39–117)
Bilirubin, Direct: 0.1 mg/dL (ref 0.0–0.3)
Total Bilirubin: 0.4 mg/dL (ref 0.2–1.2)
Total Protein: 7.5 g/dL (ref 6.0–8.3)

## 2016-04-25 ENCOUNTER — Encounter: Payer: Self-pay | Admitting: Internal Medicine

## 2016-06-16 ENCOUNTER — Other Ambulatory Visit (INDEPENDENT_AMBULATORY_CARE_PROVIDER_SITE_OTHER): Payer: BC Managed Care – PPO

## 2016-06-16 ENCOUNTER — Telehealth: Payer: Self-pay | Admitting: Radiology

## 2016-06-16 ENCOUNTER — Encounter: Payer: Self-pay | Admitting: Internal Medicine

## 2016-06-16 ENCOUNTER — Other Ambulatory Visit: Payer: Self-pay | Admitting: Internal Medicine

## 2016-06-16 DIAGNOSIS — E7800 Pure hypercholesterolemia, unspecified: Secondary | ICD-10-CM

## 2016-06-16 DIAGNOSIS — R739 Hyperglycemia, unspecified: Secondary | ICD-10-CM | POA: Diagnosis not present

## 2016-06-16 LAB — BASIC METABOLIC PANEL
BUN: 20 mg/dL (ref 6–23)
CO2: 25 mEq/L (ref 19–32)
Calcium: 10.2 mg/dL (ref 8.4–10.5)
Chloride: 107 mEq/L (ref 96–112)
Creatinine, Ser: 0.81 mg/dL (ref 0.40–1.20)
GFR: 75.57 mL/min (ref >60.00)
Glucose, Bld: 108 mg/dL — ABNORMAL HIGH (ref 70–99)
Potassium: 4.3 mEq/L (ref 3.5–5.1)
Sodium: 140 mEq/L (ref 135–145)

## 2016-06-16 LAB — LIPID PANEL
Cholesterol: 223 mg/dL — ABNORMAL HIGH (ref 0–200)
HDL: 57.8 mg/dL (ref >39.00)
LDL Cholesterol: 131 mg/dL — ABNORMAL HIGH (ref 0–99)
NonHDL: 165.04
Total CHOL/HDL Ratio: 4
Triglycerides: 168 mg/dL — ABNORMAL HIGH (ref 0.0–149.0)
VLDL: 33.6 mg/dL (ref 0.0–40.0)

## 2016-06-16 LAB — HEPATIC FUNCTION PANEL
ALT: 19 U/L (ref 0–35)
AST: 18 U/L (ref 0–37)
Albumin: 4.5 g/dL (ref 3.5–5.2)
Alkaline Phosphatase: 41 U/L (ref 39–117)
Bilirubin, Direct: 0.1 mg/dL (ref 0.0–0.3)
Total Bilirubin: 0.5 mg/dL (ref 0.2–1.2)
Total Protein: 6.9 g/dL (ref 6.0–8.3)

## 2016-06-16 LAB — HEMOGLOBIN A1C: Hgb A1c MFr Bld: 5.9 % (ref 4.6–6.5)

## 2016-06-16 NOTE — Telephone Encounter (Signed)
Orders placed for labs

## 2016-06-16 NOTE — Progress Notes (Signed)
Order placed for labs.

## 2016-06-16 NOTE — Telephone Encounter (Signed)
Pt coming in for labs, please place orders. Thank you.

## 2016-06-21 ENCOUNTER — Encounter: Payer: Self-pay | Admitting: Internal Medicine

## 2016-06-21 ENCOUNTER — Ambulatory Visit (INDEPENDENT_AMBULATORY_CARE_PROVIDER_SITE_OTHER): Payer: BC Managed Care – PPO | Admitting: Internal Medicine

## 2016-06-21 VITALS — BP 124/82 | HR 71 | Temp 98.6°F | Resp 16 | Ht 59.45 in | Wt 139.4 lb

## 2016-06-21 DIAGNOSIS — K219 Gastro-esophageal reflux disease without esophagitis: Secondary | ICD-10-CM | POA: Diagnosis not present

## 2016-06-21 DIAGNOSIS — G4733 Obstructive sleep apnea (adult) (pediatric): Secondary | ICD-10-CM | POA: Diagnosis not present

## 2016-06-21 DIAGNOSIS — Z1231 Encounter for screening mammogram for malignant neoplasm of breast: Secondary | ICD-10-CM

## 2016-06-21 DIAGNOSIS — Z1239 Encounter for other screening for malignant neoplasm of breast: Secondary | ICD-10-CM

## 2016-06-21 DIAGNOSIS — Z0000 Encounter for general adult medical examination without abnormal findings: Secondary | ICD-10-CM

## 2016-06-21 DIAGNOSIS — F439 Reaction to severe stress, unspecified: Secondary | ICD-10-CM | POA: Diagnosis not present

## 2016-06-21 DIAGNOSIS — E7800 Pure hypercholesterolemia, unspecified: Secondary | ICD-10-CM

## 2016-06-21 MED ORDER — ESTROGENS, CONJUGATED 0.625 MG/GM VA CREA: TOPICAL_CREAM | VAGINAL | 2 refills | Status: DC | PRN

## 2016-06-21 NOTE — Assessment & Plan Note (Signed)
Physical today 06/21/16.  PAP 06/11/15 - negative with negative HPV.  Mammogram 06/19/15 - Birads I.  Colonoscopy 02/03/15 - hyperplastic polyp.  Recommended f/u colonoscopy in 01/2020.

## 2016-06-21 NOTE — Progress Notes (Signed)
Pre-visit discussion using our clinic review tool. No additional management support is needed unless otherwise documented below in the visit note.  

## 2016-06-21 NOTE — Progress Notes (Signed)
Patient ID: Jessica HawkJudy E Peck, female   DOB: 01/04/1951, 66 y.o.   MRN: [id number]   Subjective:    Patient ID: Jessica HawkJudy E Peck, female    DOB: 08/07/1950, 66 y.o.   MRN: [id number]  HPI  Patient here for her physical exam.  States she is doing relatively well.  Increased stress with her daughter's issues.  She feels she needs to increase her zoloft.  Discussed with her today.  She saw GI 04/18/16.  On protonix and zantac.  Working well for her.  No acid reflux.  No abdominal pain.  No chest pain.  Bowels moving.  Discussed her cholesterol results.  She is taking crestor.  Averages 2x/week.  Discussed increasing.  She states feels causes some aching, but is tolerating current dose.  Will increase to 3x/week regularly.     Past Medical History:  Diagnosis Date  . Chronic constipation   . Esophagitis   . Gastritis    EGD (hiatal hernia)  . GERD (gastroesophageal reflux disease)   . Hypercholesterolemia   . Sleep apnea    CPAP  . Vitamin D deficiency    Past Surgical History:  Procedure Laterality Date  . APPENDECTOMY  1976   Family History  Problem Relation Age of Onset  . Colon cancer Father   . Colon cancer Mother   . COPD Mother   . Heart disease Mother   . Breast cancer Sister     died age 65  . Breast cancer      paternal aunts   Social History   Social History  . Marital status: Married    Spouse name: N/A  . Number of children: 2  . Years of education: N/A   Social History Main Topics  . Smoking status: Never Smoker  . Smokeless tobacco: Never Used  . Alcohol use 0.0 oz/week     Comment: occasional  . Drug use: No  . Sexual activity: Not Asked   Other Topics Concern  . None   Social History Narrative  . None    Outpatient Encounter Prescriptions as of 06/21/2016  Medication Sig  . conjugated estrogens (PREMARIN) vaginal cream Place vaginally as needed.  . Cyanocobalamin (RA VITAMIN B-12 TR) 1000 MCG TBCR Take by mouth.  . ranitidine (ZANTAC) 150 MG  capsule TAKE 1 CAPSULE (150 MG TOTAL) BY MOUTH NIGHTLY.  . rosuvastatin (CRESTOR) 5 MG tablet Take 1 tablet (5 mg total) by mouth daily.  . sertraline (ZOLOFT) 100 MG tablet TAKE 1 TABLET (100 MG TOTAL) BY MOUTH DAILY.  Marland Kitchen. VITAMIN D, CHOLECALCIFEROL, PO Take 4,000 Units by mouth daily.   . [DISCONTINUED] conjugated estrogens (PREMARIN) vaginal cream Place vaginally as needed.  . [DISCONTINUED] pantoprazole (PROTONIX) 20 MG tablet Take 20 mg by mouth daily.  . pantoprazole (PROTONIX) 40 MG tablet Take 40 mg by mouth daily.   No facility-administered encounter medications on file as of 06/21/2016.     Review of Systems  Constitutional: Negative for appetite change and unexpected weight change.  HENT: Negative for congestion and sinus pressure.   Eyes: Negative for pain and visual disturbance.  Respiratory: Negative for cough, chest tightness and shortness of breath.   Cardiovascular: Negative for chest pain, palpitations and leg swelling.  Gastrointestinal: Negative for abdominal pain, diarrhea, nausea and vomiting.  Genitourinary: Negative for difficulty urinating and dysuria.  Musculoskeletal: Negative for back pain and joint swelling.  Skin: Negative for color change and rash.  Neurological: Negative for dizziness, light-headedness and headaches.  Hematological:  Negative for adenopathy. Does not bruise/bleed easily.  Psychiatric/Behavioral: Negative for agitation and dysphoric mood.       Objective:    Physical Exam  Constitutional: She is oriented to person, place, and time. She appears well-developed and well-nourished. No distress.  HENT:  Nose: Nose normal.  Mouth/Throat: Oropharynx is clear and moist.  Eyes: Right eye exhibits no discharge. Left eye exhibits no discharge. No scleral icterus.  Neck: Neck supple. No thyromegaly present.  Cardiovascular: Normal rate and regular rhythm.   Pulmonary/Chest: Breath sounds normal. No accessory muscle usage. No tachypnea. No  respiratory distress. She has no decreased breath sounds. She has no wheezes. She has no rhonchi. Right breast exhibits no inverted nipple, no mass, no nipple discharge and no tenderness (no axillary adenopathy). Left breast exhibits no inverted nipple, no mass, no nipple discharge and no tenderness (no axilarry adenopathy).  Abdominal: Soft. Bowel sounds are normal. There is no tenderness.  Musculoskeletal: She exhibits no edema or tenderness.  Lymphadenopathy:    She has no cervical adenopathy.  Neurological: She is alert and oriented to person, place, and time.  Skin: Skin is warm. No rash noted. No erythema.  Psychiatric: She has a normal mood and affect. Her behavior is normal.    BP 124/82 (BP Location: Left Arm, Patient Position: Sitting, Cuff Size: Normal)   Pulse 71   Temp 98.6 F (37 C) (Oral)   Resp 16   Ht 4' 11.45" (1.51 m)   Wt 139 lb 6.4 oz (63.2 kg)   LMP 06/04/2000   SpO2 98%   BMI 27.73 kg/m  Wt Readings from Last 3 Encounters:  06/21/16 139 lb 6.4 oz (63.2 kg)  03/11/16 139 lb 6.4 oz (63.2 kg)  11/02/15 143 lb (64.9 kg)     Lab Results  Component Value Date   WBC 4.7 03/07/2016   HGB 13.4 03/07/2016   HCT 39.6 03/07/2016   PLT 271.0 03/07/2016   GLUCOSE 108 (H) 06/16/2016   CHOL 223 (H) 06/16/2016   TRIG 168.0 (H) 06/16/2016   HDL 57.80 06/16/2016   LDLDIRECT 135.3 11/28/2012   LDLCALC 131 (H) 06/16/2016   ALT 19 06/16/2016   AST 18 06/16/2016   NA 140 06/16/2016   K 4.3 06/16/2016   CL 107 06/16/2016   CREATININE 0.81 06/16/2016   BUN 20 06/16/2016   CO2 25 06/16/2016   TSH 2.56 03/07/2016   HGBA1C 5.9 06/16/2016       Assessment & Plan:   Problem List Items Addressed This Visit    GERD (gastroesophageal reflux disease)    Saw GI.  On protonix and zantac.  Controlled.  Follow.        Relevant Medications   pantoprazole (PROTONIX) 40 MG tablet   Health care maintenance    Physical today 06/21/16.  PAP 06/11/15 - negative with negative  HPV.  Mammogram 06/19/15 - Birads I.  Colonoscopy 02/03/15 - hyperplastic polyp.  Recommended f/u colonoscopy in 01/2020.         Obstructive sleep apnea    CPAP.       Pure hypercholesterolemia    Low cholesterol diet and exercise.  On crestor.  Will increase to 3x/week on a regular basis.  Follow lipid panel and liver function tests.        Stress    Increased stress.  Discussed with her today.  Increase zoloft to 100mg  q day.  Follow closely.  Get her back in soon to reassess.  Other Visit Diagnoses    Screening for breast cancer    -  Primary   Relevant Orders   MM SCREENING BREAST TOMO BILATERAL       D[place], MD

## 2016-06-25 ENCOUNTER — Encounter: Payer: Self-pay | Admitting: Internal Medicine

## 2016-06-25 NOTE — Assessment & Plan Note (Signed)
CPAP.  

## 2016-06-25 NOTE — Assessment & Plan Note (Signed)
Saw GI.  On protonix and zantac.  Controlled.  Follow.

## 2016-06-25 NOTE — Assessment & Plan Note (Signed)
Low cholesterol diet and exercise.  On crestor.  Will increase to 3x/week on a regular basis.  Follow lipid panel and liver function tests.

## 2016-06-25 NOTE — Assessment & Plan Note (Signed)
Increased stress.  Discussed with her today.  Increase zoloft to  q day.  Follow closely.  Get her back in soon to reassess.

## 2016-07-04 ENCOUNTER — Encounter: Payer: Self-pay | Admitting: Internal Medicine

## 2016-07-04 DIAGNOSIS — F439 Reaction to severe stress, unspecified: Secondary | ICD-10-CM

## 2016-07-06 NOTE — Telephone Encounter (Signed)
Order placed for psychology referral.

## 2016-07-19 ENCOUNTER — Ambulatory Visit
Admission: RE | Admit: 2016-07-19 | Discharge: 2016-07-19 | Disposition: A | Discharge status: Home / Self Care | Payer: BC Managed Care – PPO | Source: Ambulatory Visit | Attending: Internal Medicine | Admitting: Internal Medicine

## 2016-07-19 DIAGNOSIS — Z1231 Encounter for screening mammogram for malignant neoplasm of breast: Secondary | ICD-10-CM | POA: Insufficient documentation

## 2016-07-19 DIAGNOSIS — Z1239 Encounter for other screening for malignant neoplasm of breast: Secondary | ICD-10-CM

## 2016-07-22 ENCOUNTER — Encounter: Payer: Self-pay | Admitting: Internal Medicine

## 2016-08-03 ENCOUNTER — Other Ambulatory Visit: Payer: Self-pay | Admitting: Internal Medicine

## 2016-09-05 ENCOUNTER — Ambulatory Visit: Payer: BC Managed Care – PPO | Admitting: Internal Medicine

## 2016-11-01 ENCOUNTER — Ambulatory Visit: Payer: BC Managed Care – PPO | Admitting: Internal Medicine

## 2016-11-08 ENCOUNTER — Ambulatory Visit: Payer: BC Managed Care – PPO | Admitting: Psychology

## 2016-11-30 ENCOUNTER — Ambulatory Visit (INDEPENDENT_AMBULATORY_CARE_PROVIDER_SITE_OTHER): Payer: BC Managed Care – PPO | Admitting: Psychology

## 2016-11-30 DIAGNOSIS — F331 Major depressive disorder, recurrent, moderate: Secondary | ICD-10-CM | POA: Diagnosis not present

## 2016-11-30 DIAGNOSIS — F411 Generalized anxiety disorder: Secondary | ICD-10-CM

## 2016-12-15 ENCOUNTER — Ambulatory Visit (INDEPENDENT_AMBULATORY_CARE_PROVIDER_SITE_OTHER): Payer: BC Managed Care – PPO | Admitting: Psychology

## 2016-12-15 DIAGNOSIS — F331 Major depressive disorder, recurrent, moderate: Secondary | ICD-10-CM | POA: Diagnosis not present

## 2016-12-15 DIAGNOSIS — F411 Generalized anxiety disorder: Secondary | ICD-10-CM | POA: Diagnosis not present

## 2016-12-16 ENCOUNTER — Other Ambulatory Visit: Payer: Self-pay | Admitting: Internal Medicine

## 2016-12-26 ENCOUNTER — Ambulatory Visit (INDEPENDENT_AMBULATORY_CARE_PROVIDER_SITE_OTHER): Payer: BC Managed Care – PPO | Admitting: Internal Medicine

## 2016-12-26 ENCOUNTER — Encounter: Payer: Self-pay | Admitting: Internal Medicine

## 2016-12-26 DIAGNOSIS — R739 Hyperglycemia, unspecified: Secondary | ICD-10-CM | POA: Diagnosis not present

## 2016-12-26 DIAGNOSIS — E7800 Pure hypercholesterolemia, unspecified: Secondary | ICD-10-CM | POA: Diagnosis not present

## 2016-12-26 DIAGNOSIS — Z23 Encounter for immunization: Secondary | ICD-10-CM | POA: Diagnosis not present

## 2016-12-26 DIAGNOSIS — K219 Gastro-esophageal reflux disease without esophagitis: Secondary | ICD-10-CM

## 2016-12-26 DIAGNOSIS — F439 Reaction to severe stress, unspecified: Secondary | ICD-10-CM

## 2016-12-26 DIAGNOSIS — G4733 Obstructive sleep apnea (adult) (pediatric): Secondary | ICD-10-CM | POA: Diagnosis not present

## 2016-12-26 NOTE — Progress Notes (Signed)
Patient ID: Jessica Peck, female   DOB: Jan 23, 1951, 67 y.o.   MRN: [id number]   Subjective:    Patient ID: Jessica Peck, female    DOB: 06-04-1950, 66 y.o.   MRN: [id number]  HPI  Patient here for a scheduled follow up.  Increased stress with her daughter's medical history.  Her daughter is doing better.  She overall feels she is handling stress relatively well.  Discussed with her today.  On zoloft.  Feels working for her.  Discussed diet and exercise.  No chest pain.  No sob.  No acid reflux.  No abdominal pain.  Bowels moving.  Not taking her cholesterol medication regularly.  Plans to start.     Past Medical History:  Diagnosis Date  . Chronic constipation   . Esophagitis   . Gastritis    EGD (hiatal hernia)  . GERD (gastroesophageal reflux disease)   . Hypercholesterolemia   . Sleep apnea    CPAP  . Vitamin D deficiency    Past Surgical History:  Procedure Laterality Date  . APPENDECTOMY  1976   Family History  Problem Relation Age of Onset  . Colon cancer Father   . Colon cancer Mother   . COPD Mother   . Heart disease Mother   . Breast cancer Sister        died age 96  . Breast cancer Maternal Uncle 60  . Breast cancer Paternal Aunt 46   Social History   Social History  . Marital status: Married    Spouse name: N/A  . Number of children: 2  . Years of education: N/A   Social History Main Topics  . Smoking status: Never Smoker  . Smokeless tobacco: Never Used  . Alcohol use 0.0 oz/week     Comment: occasional  . Drug use: No  . Sexual activity: Not Asked   Other Topics Concern  . None   Social History Narrative  . None    Outpatient Encounter Prescriptions as of 12/26/2016  Medication Sig  . conjugated estrogens (PREMARIN) vaginal cream Place vaginally as needed.  . Cyanocobalamin (RA VITAMIN B-12 TR) 1000 MCG TBCR Take by mouth.  . pantoprazole (PROTONIX) 40 MG tablet Take 40 mg by mouth daily.  . ranitidine (ZANTAC) 150 MG capsule TAKE 1  CAPSULE (150 MG TOTAL) BY MOUTH NIGHTLY.  . sertraline (ZOLOFT) 100 MG tablet TAKE 1 TABLET BY MOUTH EVERY DAY  . VITAMIN D, CHOLECALCIFEROL, PO Take 4,000 Units by mouth daily.   . rosuvastatin (CRESTOR) 5 MG tablet Take 1 tablet (5 mg total) by mouth daily. (Patient not taking: Reported on 12/26/2016)   No facility-administered encounter medications on file as of 12/26/2016.     Review of Systems  Constitutional: Negative for appetite change and unexpected weight change.  HENT: Negative for congestion and sinus pressure.   Respiratory: Negative for cough, chest tightness and shortness of breath.   Cardiovascular: Negative for chest pain, palpitations and leg swelling.  Gastrointestinal: Negative for abdominal pain, diarrhea, nausea and vomiting.  Genitourinary: Negative for difficulty urinating and dysuria.  Musculoskeletal: Negative for back pain and joint swelling.  Skin: Negative for color change and rash.  Neurological: Negative for dizziness, light-headedness and headaches.  Psychiatric/Behavioral: Negative for agitation and dysphoric mood.       Objective:    Physical Exam  Constitutional: She appears well-developed and well-nourished. No distress.  HENT:  Nose: Nose normal.  Mouth/Throat: Oropharynx is clear and moist.  Neck: Neck supple.  No thyromegaly present.  Cardiovascular: Normal rate and regular rhythm.   Pulmonary/Chest: Breath sounds normal. No respiratory distress. She has no wheezes.  Abdominal: Soft. Bowel sounds are normal. There is no tenderness.  Musculoskeletal: She exhibits no edema or tenderness.  Lymphadenopathy:    She has no cervical adenopathy.  Skin: No rash noted. No erythema.  Psychiatric: She has a normal mood and affect. Her behavior is normal.    BP 126/84 (BP Location: Left Arm, Patient Position: Sitting, Cuff Size: Normal)   Pulse 77   Temp 98.6 F (37 C) (Oral)   Resp 12   Ht '4\' 11"'  (1.499 m)   Wt 142 lb 3.2 oz (64.5 kg)   LMP  06/04/2000   SpO2 98%   BMI 28.72 kg/m  Wt Readings from Last 3 Encounters:  12/26/16 142 lb 3.2 oz (64.5 kg)  06/21/16 139 lb 6.4 oz (63.2 kg)  03/11/16 139 lb 6.4 oz (63.2 kg)     Lab Results  Component Value Date   WBC 4.7 03/07/2016   HGB 13.4 03/07/2016   HCT 39.6 03/07/2016   PLT 271.0 03/07/2016   GLUCOSE 108 (H) 06/16/2016   CHOL 223 (H) 06/16/2016   TRIG 168.0 (H) 06/16/2016   HDL 57.80 06/16/2016   LDLDIRECT 135.3 11/28/2012   LDLCALC 131 (H) 06/16/2016   ALT 19 06/16/2016   AST 18 06/16/2016   NA 140 06/16/2016   K 4.3 06/16/2016   CL 107 06/16/2016   CREATININE 0.81 06/16/2016   BUN 20 06/16/2016   CO2 25 06/16/2016   TSH 2.56 03/07/2016   HGBA1C 5.9 06/16/2016    Mm Screening Breast Tomo Bilateral  Result Date: 07/19/2016 CLINICAL DATA:  Screening. EXAM: 2D DIGITAL SCREENING BILATERAL MAMMOGRAM WITH CAD AND ADJUNCT TOMO COMPARISON:  Previous exam(s). ACR Breast Density Category b: There are scattered areas of fibroglandular density. FINDINGS: There are no findings suspicious for malignancy. Images were processed with CAD. IMPRESSION: No mammographic evidence of malignancy. A result letter of this screening mammogram will be mailed directly to the patient. RECOMMENDATION: Screening mammogram in one year. (Code:SM-B-01Y) BI-RADS CATEGORY  1: Negative. Electronically Signed   By: Fidela Salisbury M.D.   On: 07/19/2016 16:40       Assessment & Plan:   Problem List Items Addressed This Visit    GERD (gastroesophageal reflux disease)    Controlled on current regimen.  Has seen GI.       Hyperglycemia    Low carb diet and exercise.  Follow met b and a1c.        Relevant Orders   Hemoglobin Z6O   Basic metabolic panel   Obstructive sleep apnea    CPAP.       Pure hypercholesterolemia    Not taking crestor regularly.  Plans to start taking on a regular basis.  Low cholesterol diet and exercise.  Follow lipid panel and liver function tests.         Relevant Orders   CBC with Differential/Platelet   TSH   Lipid panel   Hepatic function panel   Stress    Increased stress.  Discussed with her today.  She does feel she is doing better.  Feels better.  Handling things relatively well.  On zoloft.  Does not feel needs anything more at this time.         Other Visit Diagnoses    Need for immunization against influenza       Relevant Orders   Flu  Vaccine QUAD 36+ mos IM (Completed)       Einar Pheasant, MD

## 2016-12-29 ENCOUNTER — Encounter: Payer: Self-pay | Admitting: Internal Medicine

## 2016-12-29 DIAGNOSIS — R739 Hyperglycemia, unspecified: Secondary | ICD-10-CM | POA: Insufficient documentation

## 2016-12-29 NOTE — Assessment & Plan Note (Signed)
Low carb diet and exercise.  Follow met b and a1c.   

## 2016-12-29 NOTE — Assessment & Plan Note (Signed)
Not taking crestor regularly.  Plans to start taking on a regular basis.  Low cholesterol diet and exercise.  Follow lipid panel and liver function tests.

## 2016-12-29 NOTE — Assessment & Plan Note (Signed)
CPAP.  

## 2016-12-29 NOTE — Assessment & Plan Note (Signed)
Controlled on current regimen.  Has seen GI.   

## 2016-12-29 NOTE — Assessment & Plan Note (Signed)
Increased stress.  Discussed with her today.  She does feel she is doing better.  Feels better.  Handling things relatively well.  On zoloft.  Does not feel needs anything more at this time.

## 2017-01-03 ENCOUNTER — Ambulatory Visit (INDEPENDENT_AMBULATORY_CARE_PROVIDER_SITE_OTHER): Payer: BC Managed Care – PPO | Admitting: Psychology

## 2017-01-03 DIAGNOSIS — F331 Major depressive disorder, recurrent, moderate: Secondary | ICD-10-CM | POA: Diagnosis not present

## 2017-01-03 DIAGNOSIS — F411 Generalized anxiety disorder: Secondary | ICD-10-CM | POA: Diagnosis not present

## 2017-01-18 ENCOUNTER — Ambulatory Visit (INDEPENDENT_AMBULATORY_CARE_PROVIDER_SITE_OTHER): Payer: BC Managed Care – PPO | Admitting: Psychology

## 2017-01-18 DIAGNOSIS — F411 Generalized anxiety disorder: Secondary | ICD-10-CM | POA: Diagnosis not present

## 2017-01-18 DIAGNOSIS — F331 Major depressive disorder, recurrent, moderate: Secondary | ICD-10-CM

## 2017-01-31 ENCOUNTER — Ambulatory Visit: Payer: BC Managed Care – PPO | Admitting: Psychology

## 2017-02-09 ENCOUNTER — Ambulatory Visit: Payer: BC Managed Care – PPO | Admitting: Psychology

## 2017-02-12 ENCOUNTER — Other Ambulatory Visit: Payer: Self-pay | Admitting: Internal Medicine

## 2017-02-23 ENCOUNTER — Ambulatory Visit (INDEPENDENT_AMBULATORY_CARE_PROVIDER_SITE_OTHER): Payer: Medicare Other | Admitting: Psychology

## 2017-02-23 DIAGNOSIS — F331 Major depressive disorder, recurrent, moderate: Secondary | ICD-10-CM | POA: Diagnosis not present

## 2017-02-23 DIAGNOSIS — F411 Generalized anxiety disorder: Secondary | ICD-10-CM | POA: Diagnosis not present

## 2017-03-07 ENCOUNTER — Ambulatory Visit: Payer: Self-pay | Admitting: Psychology

## 2017-03-31 ENCOUNTER — Ambulatory Visit: Payer: Medicare Other | Admitting: Psychology

## 2017-04-05 ENCOUNTER — Other Ambulatory Visit (INDEPENDENT_AMBULATORY_CARE_PROVIDER_SITE_OTHER): Payer: Medicare Other

## 2017-04-05 ENCOUNTER — Telehealth: Payer: Self-pay | Admitting: Radiology

## 2017-04-05 ENCOUNTER — Other Ambulatory Visit: Payer: Self-pay | Admitting: Internal Medicine

## 2017-04-05 DIAGNOSIS — K219 Gastro-esophageal reflux disease without esophagitis: Secondary | ICD-10-CM

## 2017-04-05 DIAGNOSIS — R739 Hyperglycemia, unspecified: Secondary | ICD-10-CM

## 2017-04-05 DIAGNOSIS — E7800 Pure hypercholesterolemia, unspecified: Secondary | ICD-10-CM

## 2017-04-05 LAB — TSH: TSH: 5.72 u[IU]/mL — ABNORMAL HIGH (ref 0.35–4.50)

## 2017-04-05 LAB — BASIC METABOLIC PANEL
BUN: 22 mg/dL (ref 6–23)
CO2: 26 mEq/L (ref 19–32)
Calcium: 9.9 mg/dL (ref 8.4–10.5)
Chloride: 105 mEq/L (ref 96–112)
Creatinine, Ser: 0.79 mg/dL (ref 0.40–1.20)
GFR: 77.59 mL/min (ref >60.00)
Glucose, Bld: 119 mg/dL — ABNORMAL HIGH (ref 70–99)
Potassium: 4.2 mEq/L (ref 3.5–5.1)
Sodium: 140 mEq/L (ref 135–145)

## 2017-04-05 LAB — CBC WITH DIFFERENTIAL/PLATELET
Basophils Absolute: 0 10*3/uL (ref 0.0–0.1)
Basophils Relative: 0.6 % (ref 0.0–3.0)
Eosinophils Absolute: 0.2 10*3/uL (ref 0.0–0.7)
Eosinophils Relative: 2.8 % (ref 0.0–5.0)
HCT: 40.1 % (ref 36.0–46.0)
Hemoglobin: 13.2 g/dL (ref 12.0–15.0)
Lymphocytes Relative: 53.2 % — ABNORMAL HIGH (ref 12.0–46.0)
Lymphs Abs: 2.9 10*3/uL (ref 0.7–4.0)
MCHC: 33 g/dL (ref 30.0–36.0)
MCV: 90.1 fl (ref 78.0–100.0)
Monocytes Absolute: 0.3 10*3/uL (ref 0.1–1.0)
Monocytes Relative: 6.2 % (ref 3.0–12.0)
Neutro Abs: 2 10*3/uL (ref 1.4–7.7)
Neutrophils Relative %: 37.2 % — ABNORMAL LOW (ref 43.0–77.0)
Platelets: 287 10*3/uL (ref 150.0–400.0)
RBC: 4.45 Mil/uL (ref 3.87–5.11)
RDW: 13.5 % (ref 11.5–15.5)
WBC: 5.4 10*3/uL (ref 4.0–10.5)

## 2017-04-05 LAB — LIPID PANEL
Cholesterol: 174 mg/dL (ref 0–200)
HDL: 53.4 mg/dL (ref >39.00)
LDL Cholesterol: 94 mg/dL (ref 0–99)
NonHDL: 120.68
Total CHOL/HDL Ratio: 3
Triglycerides: 131 mg/dL (ref 0.0–149.0)
VLDL: 26.2 mg/dL (ref 0.0–40.0)

## 2017-04-05 LAB — VITAMIN B12: Vitamin B-12: 503 pg/mL (ref 211–911)

## 2017-04-05 LAB — HEPATIC FUNCTION PANEL
ALT: 21 U/L (ref 0–35)
AST: 17 U/L (ref 0–37)
Albumin: 4.6 g/dL (ref 3.5–5.2)
Alkaline Phosphatase: 42 U/L (ref 39–117)
Bilirubin, Direct: 0.1 mg/dL (ref 0.0–0.3)
Total Bilirubin: 0.4 mg/dL (ref 0.2–1.2)
Total Protein: 6.9 g/dL (ref 6.0–8.3)

## 2017-04-05 LAB — MAGNESIUM: Magnesium: 2.2 mg/dL (ref 1.5–2.5)

## 2017-04-05 LAB — HEMOGLOBIN A1C: Hgb A1c MFr Bld: 5.9 % (ref 4.6–6.5)

## 2017-04-05 NOTE — Addendum Note (Signed)
Addended by: Penne LashWIGGINS, Jonell Krontz N on: 04/05/2017 09:46 AM   Modules accepted: Orders

## 2017-04-05 NOTE — Progress Notes (Signed)
Orders placed for labs requested by GI.  See phone note.

## 2017-04-05 NOTE — Telephone Encounter (Signed)
Pt had labs today and [facility] on Vitamin B12 and Magnesium with dx of gastroesophageal reflux. Advised pt that you would have to place order if needed. Thank you

## 2017-04-05 NOTE — Telephone Encounter (Signed)
Orders placed for labs.  Thanks.

## 2017-04-07 ENCOUNTER — Other Ambulatory Visit: Payer: Self-pay | Admitting: Internal Medicine

## 2017-04-07 ENCOUNTER — Telehealth: Payer: Self-pay | Admitting: Internal Medicine

## 2017-04-07 DIAGNOSIS — R7989 Other specified abnormal findings of blood chemistry: Secondary | ICD-10-CM

## 2017-04-07 NOTE — Telephone Encounter (Signed)
Pt. Given lab results and instructions. Verbalizes understanding. Appointment made for follow-up TSH.

## 2017-04-07 NOTE — Progress Notes (Signed)
Order placed for f/u tsh.  

## 2017-05-01 ENCOUNTER — Ambulatory Visit: Payer: Medicare Other | Admitting: Family Medicine

## 2017-05-01 ENCOUNTER — Encounter: Payer: Self-pay | Admitting: Family Medicine

## 2017-05-01 VITALS — BP 118/66 | HR 80 | Temp 98.9°F | Ht 59.75 in | Wt 138.8 lb

## 2017-05-01 DIAGNOSIS — B9789 Other viral agents as the cause of diseases classified elsewhere: Secondary | ICD-10-CM | POA: Diagnosis not present

## 2017-05-01 DIAGNOSIS — J069 Acute upper respiratory infection, unspecified: Secondary | ICD-10-CM | POA: Diagnosis not present

## 2017-05-01 MED ORDER — BENZONATATE 200 MG PO CAPS: 200.0000 mg | ORAL_CAPSULE | Freq: Three times a day (TID) | ORAL | 1 refills | Status: DC | PRN

## 2017-05-01 NOTE — Assessment & Plan Note (Signed)
Flu like illness- fever controlled by otc medication  Reassuring exam  Supportive care  Tessalon prn cough  otc guif-DM prn  Rest /fluids Disc symptomatic care - see instructions on AVS  Handout given Update if not starting to improve in a week or if worsening

## 2017-05-01 NOTE — Progress Notes (Signed)
Subjective:    Patient ID: Jessica Peck, female    DOB: April 28, 1950, 67 y.o.   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 Viral URI with cough 05/01/2017  . Hyperglycemia 12/29/2016  . History of colonic polyps 04/08/2015  . Health care maintenance 12/13/2014  . SOB (shortness of breath) 12/13/2014  . Stress 02/24/2014  . Change in bowel function 08/25/2013  . Fatigue 08/25/2013  . Skin lesion 08/25/2013  . Shoulder pain 03/09/2013  . Concussion with no loss of consciousness 02/20/2013  . Esophagitis 06/05/2012  . Obstructive sleep apnea 06/05/2012  . Pure hypercholesterolemia 06/03/2012  . GERD (gastroesophageal reflux disease) 06/03/2012   Past Medical History:  Diagnosis Date  . Chronic constipation   . Esophagitis   . Gastritis    EGD (hiatal hernia)  . GERD (gastroesophageal reflux disease)   . Hypercholesterolemia   . Sleep apnea    CPAP  . Vitamin D deficiency    Past Surgical History:  Procedure Laterality Date  . APPENDECTOMY  1976   Social History   Tobacco Use  . Smoking status: Never Smoker  . Smokeless tobacco: Never Used  Substance Use Topics  . Alcohol use: Yes    Alcohol/week: 0.0 oz    Comment: occasional  . Drug use: No   Family History  Problem Relation Age of Onset  . Colon cancer Father   . Colon cancer Mother   . COPD Mother   . Heart disease Mother   . Breast cancer Sister        died age 63  . Breast cancer Maternal Uncle 60  . Breast cancer Paternal Aunt 60   Allergies  Allergen Reactions  . Penicillins Swelling   Current Outpatient  Medications on File Prior to Visit  Medication Sig Dispense Refill  . conjugated estrogens (PREMARIN) vaginal cream Place vaginally as needed. 42.5 g 2  . Cyanocobalamin (RA VITAMIN B-12 TR) 1000 MCG TBCR Take by mouth.    . pantoprazole (PROTONIX) 40 MG tablet Take 40 mg by mouth daily.    . ranitidine (ZANTAC) 150 MG capsule TAKE 1 CAPSULE (150 MG TOTAL) BY MOUTH NIGHTLY. 90 capsule 11  . rosuvastatin (CRESTOR) 5 MG tablet TAKE 1 TABLET (5 MG TOTAL) BY MOUTH DAILY. 30 tablet 2  . sertraline (ZOLOFT) 100 MG tablet TAKE 1 TABLET BY MOUTH EVERY DAY 30 tablet 2  . VITAMIN D, CHOLECALCIFEROL, PO Take 4,000 Units by mouth daily.      No current facility-administered medications on file prior to visit.     Review of Systems  Constitutional: Positive for appetite change and fatigue. Negative for fever.  HENT: Positive for congestion, postnasal drip, rhinorrhea, sinus pressure, sneezing and sore throat. Negative for ear pain.   Eyes: Negative for pain and discharge.  Respiratory: Positive for cough. Negative for shortness of breath, wheezing and stridor.   Cardiovascular: Negative for chest pain.  Gastrointestinal: Negative for diarrhea, nausea and vomiting.  Genitourinary: Negative  for frequency, hematuria and urgency.  Musculoskeletal: Negative for arthralgias and myalgias.  Skin: Negative for rash.  Neurological: Positive for headaches. Negative for dizziness, weakness and light-headedness.  Psychiatric/Behavioral: Negative for confusion and dysphoric mood.       Objective:   Physical Exam  Constitutional: She appears well-developed and well-nourished. No distress.  Well appearing  Afebrile   HENT:  Head: Normocephalic and atraumatic.  Right Ear: External ear normal.  Left Ear: External ear normal.  Mouth/Throat: Oropharynx is clear and moist.  Nares are injected and congested  No sinus tenderness Clear rhinorrhea and post nasal drip   Eyes: Conjunctivae and EOM are normal. Pupils  are equal, round, and reactive to light. Right eye exhibits no discharge. Left eye exhibits no discharge.  Neck: Normal range of motion. Neck supple.  Cardiovascular: Normal rate and normal heart sounds.  Pulmonary/Chest: Effort normal and breath sounds normal. No respiratory distress. She has no wheezes. She has no rales. She exhibits no tenderness.  Lymphadenopathy:    She has no cervical adenopathy.  Neurological: She is alert.  Skin: Skin is warm and dry. No rash noted.  Psychiatric: She has a normal mood and affect.          Assessment & Plan:   Problem List Items Addressed This Visit      Respiratory   Viral URI with cough    Flu like illness- fever controlled by otc medication  Reassuring exam  Supportive care  Tessalon prn cough  otc guif-DM prn  Rest /fluids Disc symptomatic care - see instructions on AVS  Handout given Update if not starting to improve in a week or if worsening

## 2017-05-01 NOTE — Patient Instructions (Addendum)
I think you have a viral flu like illness  Drink lots of fluids  Keep resting  Tylenol-as directed for fever/chills/aches and headache   Try the tessalon for cough  Also get mucinex DM over the counter for cough and congestion   You are contagious - do not go back to work until your fever subsides and you start to feel better

## 2017-05-09 ENCOUNTER — Other Ambulatory Visit: Payer: Self-pay | Admitting: Internal Medicine

## 2017-05-22 ENCOUNTER — Encounter: Payer: Self-pay | Admitting: Internal Medicine

## 2017-05-23 NOTE — Telephone Encounter (Signed)
Please add her to schedule 1:00 Wednesday 05/24/17.  Thanks

## 2017-05-24 ENCOUNTER — Encounter: Payer: Self-pay | Admitting: Internal Medicine

## 2017-05-24 ENCOUNTER — Ambulatory Visit: Payer: Medicare Other | Admitting: Internal Medicine

## 2017-05-24 DIAGNOSIS — J069 Acute upper respiratory infection, unspecified: Secondary | ICD-10-CM

## 2017-05-24 DIAGNOSIS — K219 Gastro-esophageal reflux disease without esophagitis: Secondary | ICD-10-CM

## 2017-05-24 MED ORDER — NEOMYCIN-POLYMYXIN-HC 3.5-10000-1 OT SOLN
3.0000 [drp] | Freq: Four times a day (QID) | OTIC | 0 refills | Status: DC
Start: 2017-05-24 — End: 2017-10-19

## 2017-05-24 MED ORDER — DOXYCYCLINE HYCLATE 100 MG PO TABS: 100.0000 mg | ORAL_TABLET | Freq: Two times a day (BID) | ORAL | 0 refills | Status: DC

## 2017-05-24 MED ORDER — PREDNISONE 10 MG PO TABS: ORAL_TABLET | ORAL | 0 refills | Status: DC

## 2017-05-24 NOTE — Progress Notes (Signed)
Patient ID: Jessica Peck, female   DOB: 1950-11-08, 67 y.o.   MRN: [id number]   Subjective:    Patient ID: Jessica Peck, female    DOB: 1951/03/25, 67 y.o.   MRN: [id number]  HPI  Patient here as a work in with concerns regarding persistent cough and congestion.  She was seen on 05/01/17.  Diagnosed with the flu.  No tamiflu given length of symptoms.  Has been treating symptoms.  Took mucinex and vitamin C.  Reports persistent congestion and cough.  Then noticed over the past week, return of fever - 101.  Also noticed sore throat and right earache.  Increased nasal congestion and sinus pressure.  Increased cough with coughing "fits".  No vomiting.  Eating and drinking.  No diarrhea.  Has been taking dayquil, nyquil and tylenol.     Past Medical History:  Diagnosis Date  . Chronic constipation   . Esophagitis   . Gastritis    EGD (hiatal hernia)  . GERD (gastroesophageal reflux disease)   . Hypercholesterolemia   . Sleep apnea    CPAP  . Vitamin D deficiency    Past Surgical History:  Procedure Laterality Date  . APPENDECTOMY  1976   Family History  Problem Relation Age of Onset  . Colon cancer Father   . Colon cancer Mother   . COPD Mother   . Heart disease Mother   . Breast cancer Sister        died age 68  . Breast cancer Maternal Uncle 60  . Breast cancer Paternal Aunt 38   Social History   Socioeconomic History  . Marital status: Married    Spouse name: None  . Number of children: 2  . Years of education: None  . Highest education level: None  Social Needs  . Financial resource strain: None  . Food insecurity - worry: None  . Food insecurity - inability: None  . Transportation needs - medical: None  . Transportation needs - non-medical: None  Occupational History  . None  Tobacco Use  . Smoking status: Never Smoker  . Smokeless tobacco: Never Used  Substance and Sexual Activity  . Alcohol use: Yes    Alcohol/week: 0.0 oz    Comment: occasional  .  Drug use: No  . Sexual activity: None  Other Topics Concern  . None  Social History Narrative  . None    Outpatient Encounter Medications as of 05/24/2017  Medication Sig  . benzonatate (TESSALON) 200 MG capsule Take 1 capsule (200 mg total) by mouth 3 (three) times daily as needed for cough. Swallow whole, do not bite pill  . conjugated estrogens (PREMARIN) vaginal cream Place vaginally as needed.  . Cyanocobalamin (RA VITAMIN B-12 TR) 1000 MCG TBCR Take by mouth.  . pantoprazole (PROTONIX) 40 MG tablet Take 40 mg by mouth daily.  . ranitidine (ZANTAC) 150 MG capsule TAKE 1 CAPSULE (150 MG TOTAL) BY MOUTH NIGHTLY.  . sertraline (ZOLOFT) 100 MG tablet TAKE 1 TABLET BY MOUTH EVERY DAY  . VITAMIN D, CHOLECALCIFEROL, PO Take 4,000 Units by mouth daily.   Marland Kitchen doxycycline (VIBRA-TABS) 100 MG tablet Take 1 tablet (100 mg total) by mouth 2 (two) times daily.  Marland Kitchen neomycin-polymyxin-hydrocortisone (CORTISPORIN) OTIC solution Place 3 drops into the right ear 4 (four) times daily.  . predniSONE (DELTASONE) 10 MG tablet Take 6 tablets x 1 day and then decrease by 1/2 tablet per day until down to zero mg.  . rosuvastatin (CRESTOR) 5 MG  tablet TAKE 1 TABLET (5 MG TOTAL) BY MOUTH DAILY. (Patient not taking: Reported on 05/24/2017)   No facility-administered encounter medications on file as of 05/24/2017.     Review of Systems  Constitutional: Positive for fever. Negative for appetite change.  HENT: Positive for congestion, postnasal drip and sinus pressure.   Respiratory: Positive for cough. Negative for chest tightness and shortness of breath.   Cardiovascular: Negative for chest pain, palpitations and leg swelling.  Gastrointestinal: Negative for abdominal pain, diarrhea, nausea and vomiting.  Musculoskeletal: Negative for joint swelling and myalgias.  Skin: Negative for color change and rash.  Neurological: Negative for dizziness, light-headedness and headaches.  Psychiatric/Behavioral: Negative for  agitation and dysphoric mood.       Objective:    Physical Exam  Constitutional: She appears well-developed and well-nourished. No distress.  HENT:  Mouth/Throat: Oropharynx is clear and moist.  Left TM with cerumen present.  Canal with some increased erythema.  Increased sinus tenderness to palpation.  Nares - erythematous turbinates.    Neck: Neck supple.  Cardiovascular: Normal rate and regular rhythm.  Pulmonary/Chest: Breath sounds normal. No respiratory distress. She has no wheezes.  Increased cough with forced expiration.    Lymphadenopathy:    She has no cervical adenopathy.  Skin: No rash noted. No erythema.  Psychiatric: She has a normal mood and affect. Her behavior is normal.    BP 120/82 (BP Location: Left Arm, Patient Position: Sitting, Cuff Size: Normal)   Pulse 81   Temp 98.1 F (36.7 C) (Oral)   Resp 18   Wt 138 lb 6.4 oz (62.8 kg)   LMP 06/04/2000   SpO2 98%   BMI 27.26 kg/m  Wt Readings from Last 3 Encounters:  05/24/17 138 lb 6.4 oz (62.8 kg)  05/01/17 138 lb 12 oz (62.9 kg)  12/26/16 142 lb 3.2 oz (64.5 kg)     Lab Results  Component Value Date   WBC 5.4 04/05/2017   HGB 13.2 04/05/2017   HCT 40.1 04/05/2017   PLT 287.0 04/05/2017   GLUCOSE 119 (H) 04/05/2017   CHOL 174 04/05/2017   TRIG 131.0 04/05/2017   HDL 53.40 04/05/2017   LDLDIRECT 135.3 11/28/2012   LDLCALC 94 04/05/2017   ALT 21 04/05/2017   AST 17 04/05/2017   NA 140 04/05/2017   K 4.2 04/05/2017   CL 105 04/05/2017   CREATININE 0.79 04/05/2017   BUN 22 04/05/2017   CO2 26 04/05/2017   TSH 5.72 (H) 04/05/2017   HGBA1C 5.9 04/05/2017    Mm Screening Breast Tomo Bilateral  Result Date: 07/19/2016 CLINICAL DATA:  Screening. EXAM: 2D DIGITAL SCREENING BILATERAL MAMMOGRAM WITH CAD AND ADJUNCT TOMO COMPARISON:  Previous exam(s). ACR Breast Density Category b: There are scattered areas of fibroglandular density. FINDINGS: There are no findings suspicious for malignancy. Images  were processed with CAD. IMPRESSION: No mammographic evidence of malignancy. A result letter of this screening mammogram will be mailed directly to the patient. RECOMMENDATION: Screening mammogram in one year. (Code:SM-B-01Y) BI-RADS CATEGORY  1: Negative. Electronically Signed   By: Ted Mcalpine M.D.   On: 07/19/2016 16:40       Assessment & Plan:   Problem List Items Addressed This Visit    GERD (gastroesophageal reflux disease)    Controlled on current regimen.        URI (upper respiratory infection)    Presents with increased congestion, earache and persistent cough.  Had flu.  With worsening symptoms over the past week  as outlined.  Concern regarding bacterial infection.  Right ear - canal appears to be inflamed (cerumen present).  Also with persistent cough and congestion.  Treat with doxycycline as directed.  Probiotic.  Saline nasal spray and nasacort nasal spray as instructed.  Prednisone taper as directed.  Mucinex/robitussin.  Follow.            Dale DurhamSCOTT, Anastassia Noack, MD

## 2017-05-24 NOTE — Patient Instructions (Addendum)
nasacort nasal spray - 2 sprays each nostril one time per day.  Do this in the evening.    Saline nasal spray - flush nose at least 2-3x/day 

## 2017-05-27 ENCOUNTER — Encounter: Payer: Self-pay | Admitting: Internal Medicine

## 2017-05-27 DIAGNOSIS — J069 Acute upper respiratory infection, unspecified: Secondary | ICD-10-CM | POA: Insufficient documentation

## 2017-05-27 NOTE — Assessment & Plan Note (Signed)
Controlled on current regimen.   

## 2017-05-27 NOTE — Assessment & Plan Note (Signed)
Presents with increased congestion, earache and persistent cough.  Had flu.  With worsening symptoms over the past week as outlined.  Concern regarding bacterial infection.  Right ear - canal appears to be inflamed (cerumen present).  Also with persistent cough and congestion.  Treat with doxycycline as directed.  Probiotic.  Saline nasal spray and nasacort nasal spray as instructed.  Prednisone taper as directed.  Mucinex/robitussin.  Follow.

## 2017-05-29 ENCOUNTER — Other Ambulatory Visit: Payer: Self-pay

## 2017-06-29 ENCOUNTER — Encounter: Payer: Self-pay | Admitting: Internal Medicine

## 2017-08-10 ENCOUNTER — Other Ambulatory Visit: Payer: Self-pay | Admitting: Internal Medicine

## 2017-08-10 DIAGNOSIS — Z1231 Encounter for screening mammogram for malignant neoplasm of breast: Secondary | ICD-10-CM

## 2017-09-04 ENCOUNTER — Ambulatory Visit
Admission: RE | Admit: 2017-09-04 | Discharge: 2017-09-04 | Disposition: A | Discharge status: Home / Self Care | Payer: Medicare Other | Source: Ambulatory Visit | Attending: Internal Medicine | Admitting: Internal Medicine

## 2017-09-04 DIAGNOSIS — Z1231 Encounter for screening mammogram for malignant neoplasm of breast: Secondary | ICD-10-CM | POA: Diagnosis present

## 2017-09-21 ENCOUNTER — Other Ambulatory Visit: Payer: Self-pay | Admitting: Internal Medicine

## 2017-10-18 ENCOUNTER — Telehealth: Payer: Self-pay

## 2017-10-18 ENCOUNTER — Other Ambulatory Visit: Payer: Self-pay | Admitting: Internal Medicine

## 2017-10-18 DIAGNOSIS — R739 Hyperglycemia, unspecified: Secondary | ICD-10-CM

## 2017-10-18 DIAGNOSIS — E7800 Pure hypercholesterolemia, unspecified: Secondary | ICD-10-CM

## 2017-10-18 NOTE — Progress Notes (Signed)
Order placed for labs.

## 2017-10-18 NOTE — Telephone Encounter (Signed)
OK to schedule patient for labs prior to her physical?

## 2017-10-18 NOTE — Telephone Encounter (Signed)
I have placed order for labs, but it appears she has cpe tomorrow.

## 2017-10-18 NOTE — Telephone Encounter (Signed)
Copied from CRM [id number]. Topic: General - Other >> Oct 18, 2017 11:08 AM Gaynelle AduPoole, Shalonda wrote: Reason for CRM: Patient is calling to request a appt for labs before her Physical on 10-19-17 at 3pm. Please advise

## 2017-10-18 NOTE — Telephone Encounter (Signed)
Patient is going to schedule fasting lab tomorrow while she is here.

## 2017-10-19 ENCOUNTER — Ambulatory Visit (INDEPENDENT_AMBULATORY_CARE_PROVIDER_SITE_OTHER): Payer: Medicare Other | Admitting: Internal Medicine

## 2017-10-19 ENCOUNTER — Encounter

## 2017-10-19 VITALS — BP 120/78 | HR 74 | Temp 98.3°F | Resp 18 | Ht 60.0 in | Wt 140.0 lb

## 2017-10-19 DIAGNOSIS — K219 Gastro-esophageal reflux disease without esophagitis: Secondary | ICD-10-CM | POA: Diagnosis not present

## 2017-10-19 DIAGNOSIS — R739 Hyperglycemia, unspecified: Secondary | ICD-10-CM | POA: Diagnosis not present

## 2017-10-19 DIAGNOSIS — F439 Reaction to severe stress, unspecified: Secondary | ICD-10-CM | POA: Diagnosis not present

## 2017-10-19 DIAGNOSIS — K209 Esophagitis, unspecified without bleeding: Secondary | ICD-10-CM

## 2017-10-19 DIAGNOSIS — Z0000 Encounter for general adult medical examination without abnormal findings: Secondary | ICD-10-CM

## 2017-10-19 DIAGNOSIS — G4733 Obstructive sleep apnea (adult) (pediatric): Secondary | ICD-10-CM | POA: Diagnosis not present

## 2017-10-19 DIAGNOSIS — E7800 Pure hypercholesterolemia, unspecified: Secondary | ICD-10-CM

## 2017-10-19 DIAGNOSIS — M25551 Pain in right hip: Secondary | ICD-10-CM

## 2017-10-19 DIAGNOSIS — Z0001 Encounter for general adult medical examination with abnormal findings: Secondary | ICD-10-CM

## 2017-10-19 DIAGNOSIS — Z6827 Body mass index (BMI) 27.0-27.9, adult: Secondary | ICD-10-CM

## 2017-10-19 MED ORDER — SERTRALINE HCL 50 MG PO TABS: ORAL_TABLET | ORAL | 2 refills | Status: DC

## 2017-10-19 NOTE — Patient Instructions (Signed)
prevnar - new pneumonia shot  shingrix - new shingles shot.

## 2017-10-19 NOTE — Progress Notes (Signed)
Patient ID: Jessica Peck, female   DOB: 1951-03-09, 67 y.o.   MRN: [id number]   Subjective:    Patient ID: Jessica Peck, female    DOB: 12-Oct-1950, 67 y.o.   MRN: [id number]  HPI  Patient here for her physical exam.  She reports she is doing relatively well.  Has been having problems with her right hip.  Also some stiffness and discomfort in her hands and knees.  She stopped her crestor.  Thought was aggravating.  Pain in right ip and into right thigh and leg - persist.  No chest pain.  Not exercising.  Breathing stable.  No acid reflux.  No abdominal pain. Bowels stable.  Increased stress.  Discussed with her today.  Feels needs to increase dose of zoloft.  Also was questioning sleep apnea.  Has known sleep apnea.  Has a machine.  Not using.  Not sure if settings are correct.  Discussed home auto titration.  Discussed diet and exercise.  Request referral to a nutritionist.     Past Medical History:  Diagnosis Date  . Chronic constipation   . Esophagitis   . Gastritis    EGD (hiatal hernia)  . GERD (gastroesophageal reflux disease)   . Hypercholesterolemia   . Sleep apnea    CPAP  . Vitamin D deficiency    Past Surgical History:  Procedure Laterality Date  . APPENDECTOMY  1976   Family History  Problem Relation Age of Onset  . Colon cancer Father   . Colon cancer Mother   . COPD Mother   . Heart disease Mother   . Breast cancer Sister        died age 51  . Breast cancer Maternal Uncle 60  . Breast cancer Paternal Aunt 35   Social History   Socioeconomic History  . Marital status: Married    Spouse name: Not on file  . Number of children: 2  . Years of education: Not on file  . Highest education level: Not on file  Occupational History  . Not on file  Social Needs  . Financial resource strain: Not on file  . Food insecurity:    Worry: Not on file    Inability: Not on file  . Transportation needs:    Medical: Not on file    Non-medical: Not on file    Tobacco Use  . Smoking status: Never Smoker  . Smokeless tobacco: Never Used  Substance and Sexual Activity  . Alcohol use: Yes    Alcohol/week: 0.0 oz    Comment: occasional  . Drug use: No  . Sexual activity: Not on file  Lifestyle  . Physical activity:    Days per week: Not on file    Minutes per session: Not on file  . Stress: Not on file  Relationships  . Social connections:    Talks on phone: Not on file    Gets together: Not on file    Attends religious service: Not on file    Active member of club or organization: Not on file    Attends meetings of clubs or organizations: Not on file    Relationship status: Not on file  Other Topics Concern  . Not on file  Social History Narrative  . Not on file    Outpatient Encounter Medications as of 10/19/2017  Medication Sig  . conjugated estrogens (PREMARIN) vaginal cream Place vaginally as needed.  . Cyanocobalamin (RA VITAMIN B-12 TR) 1000 MCG TBCR Take by mouth.  Marland Kitchen  Melatonin 3 MG TABS Take 0.5 tablets by mouth at bedtime.  . pantoprazole (PROTONIX) 40 MG tablet Take 40 mg by mouth daily.  . ranitidine (ZANTAC) 150 MG capsule TAKE 1 CAPSULE (150 MG TOTAL) BY MOUTH NIGHTLY.  . Turmeric 500 MG CAPS Take by mouth daily.  Marland Kitchen VITAMIN D, CHOLECALCIFEROL, PO Take 4,000 Units by mouth daily.   . [DISCONTINUED] sertraline (ZOLOFT) 100 MG tablet TAKE 1 TABLET BY MOUTH EVERY DAY  . rosuvastatin (CRESTOR) 5 MG tablet TAKE 1 TABLET (5 MG TOTAL) BY MOUTH DAILY. (Patient not taking: Reported on 05/24/2017)  . sertraline (ZOLOFT) 50 MG tablet Take 1 1/2 tablet per day  . [DISCONTINUED] benzonatate (TESSALON) 200 MG capsule Take 1 capsule (200 mg total) by mouth 3 (three) times daily as needed for cough. Swallow whole, do not bite pill  . [DISCONTINUED] doxycycline (VIBRA-TABS) 100 MG tablet Take 1 tablet (100 mg total) by mouth 2 (two) times daily.  . [DISCONTINUED] neomycin-polymyxin-hydrocortisone (CORTISPORIN) OTIC solution Place 3 drops  into the right ear 4 (four) times daily.  . [DISCONTINUED] predniSONE (DELTASONE) 10 MG tablet Take 6 tablets x 1 day and then decrease by 1/2 tablet per day until down to zero mg.   No facility-administered encounter medications on file as of 10/19/2017.     Review of Systems  Constitutional: Negative for appetite change and unexpected weight change.  HENT: Negative for congestion and sinus pressure.   Eyes: Negative for pain and visual disturbance.  Respiratory: Negative for cough, chest tightness and shortness of breath.   Cardiovascular: Negative for chest pain, palpitations and leg swelling.  Gastrointestinal: Negative for abdominal pain, diarrhea and nausea.  Genitourinary: Negative for difficulty urinating and dysuria.  Musculoskeletal: Negative for joint swelling and myalgias.       Persistent pain in right hip and right thigh/leg.    Skin: Negative for color change and rash.  Neurological: Negative for dizziness, light-headedness and headaches.  Hematological: Negative for adenopathy. Does not bruise/bleed easily.  Psychiatric/Behavioral: Negative for agitation and dysphoric mood.       Increased stress.         Objective:    Physical Exam  Constitutional: She is oriented to person, place, and time. She appears well-developed and well-nourished. No distress.  HENT:  Nose: Nose normal.  Mouth/Throat: Oropharynx is clear and moist.  Eyes: Right eye exhibits no discharge. Left eye exhibits no discharge. No scleral icterus.  Neck: Neck supple. No thyromegaly present.  Cardiovascular: Normal rate and regular rhythm.  Pulmonary/Chest: Breath sounds normal. No accessory muscle usage. No tachypnea. No respiratory distress. She has no decreased breath sounds. She has no wheezes. She has no rhonchi. Right breast exhibits no inverted nipple, no mass, no nipple discharge and no tenderness (no axillary adenopathy). Left breast exhibits no inverted nipple, no mass, no nipple discharge and  no tenderness (no axilarry adenopathy).  Abdominal: Soft. Bowel sounds are normal. There is no tenderness.  Musculoskeletal: She exhibits no edema or tenderness.  Lymphadenopathy:    She has no cervical adenopathy.  Neurological: She is alert and oriented to person, place, and time.  Skin: No rash noted. No erythema.  Psychiatric: She has a normal mood and affect. Her behavior is normal.    BP 120/78 (BP Location: Left Arm, Patient Position: Sitting, Cuff Size: Normal)   Pulse 74   Temp 98.3 F (36.8 C) (Oral)   Resp 18   Ht 5' (1.524 m)   Wt 140 lb (63.5 kg)  LMP 06/04/2000   SpO2 96%   BMI 27.34 kg/m  Wt Readings from Last 3 Encounters:  10/19/17 140 lb (63.5 kg)  05/24/17 138 lb 6.4 oz (62.8 kg)  05/01/17 138 lb 12 oz (62.9 kg)     Lab Results  Component Value Date   WBC 5.4 04/05/2017   HGB 13.2 04/05/2017   HCT 40.1 04/05/2017   PLT 287.0 04/05/2017   GLUCOSE 119 (H) 04/05/2017   CHOL 174 04/05/2017   TRIG 131.0 04/05/2017   HDL 53.40 04/05/2017   LDLDIRECT 135.3 11/28/2012   LDLCALC 94 04/05/2017   ALT 21 04/05/2017   AST 17 04/05/2017   NA 140 04/05/2017   K 4.2 04/05/2017   CL 105 04/05/2017   CREATININE 0.79 04/05/2017   BUN 22 04/05/2017   CO2 26 04/05/2017   TSH 5.72 (H) 04/05/2017   HGBA1C 5.9 04/05/2017    Mm 3d Screen Breast Bilateral  Result Date: 09/04/2017 CLINICAL DATA:  Screening. EXAM: DIGITAL SCREENING BILATERAL MAMMOGRAM WITH TOMO AND CAD COMPARISON:  Previous exam(s). ACR Breast Density Category b: There are scattered areas of fibroglandular density. FINDINGS: There are no findings suspicious for malignancy. Images were processed with CAD. IMPRESSION: No mammographic evidence of malignancy. A result letter of this screening mammogram will be mailed directly to the patient. RECOMMENDATION: Screening mammogram in one year. (Code:SM-B-01Y) BI-RADS CATEGORY  1: Negative. Electronically Signed   By: Lajean Manes M.D.   On: 09/04/2017 13:21        Assessment & Plan:   Problem List Items Addressed This Visit    BMI 27.0-27.9,adult    Desires referral to a nutritionist for weight loss.        Relevant Orders   Amb ref to Medical Nutrition Therapy-MNT   Esophagitis    Noted on previous EGD.  Currently controlled on current regimen.  Follow.        GERD (gastroesophageal reflux disease)    Controlled on current regimen.        Health care maintenance    Physical today 10/19/17.  PAP 06/11/15 - negative with negative HPV.  Mammogram 09/04/17 - Birads I.  Colonoscopy 02/03/15 - hyperplastic polyp.  Recommended f/u colonoscopy in 01/2020.        Hyperglycemia    Low carb diet and exercise.  Follow met b and a1c.        Obstructive sleep apnea    Has known sleep apnea.  Not using her machine now.  Feels settings may not be right.  See if can obtain a home auto titration.  Used [facility].        Pure hypercholesterolemia    Off crestor.  Felt was contributing to aching and joint pain.  Low cholesterol diet and exercise.  Follow lipid panel.  Still with discomfort.  Not sure if cholesterol medication contributing.        Right hip pain    Describes persistent right hip and right leg pain.  Check xray.        Relevant Orders   DG Lumbar Spine 2-3 Views   DG HIP UNILAT WITH PELVIS 2-3 VIEWS RIGHT   Rheumatoid factor   C-reactive protein   Sedimentation rate   Stress    Increased stress as outlined.  Discussed with her today.  Will increase zoloft to 35m q day.  Follow.         Other Visit Diagnoses    Routine general medical examination at a health care  facility    -  Primary       Einar Pheasant, MD

## 2017-10-22 ENCOUNTER — Encounter: Payer: Self-pay | Admitting: Internal Medicine

## 2017-10-22 DIAGNOSIS — M25551 Pain in right hip: Secondary | ICD-10-CM | POA: Insufficient documentation

## 2017-10-22 DIAGNOSIS — Z6827 Body mass index (BMI) 27.0-27.9, adult: Secondary | ICD-10-CM | POA: Insufficient documentation

## 2017-10-22 NOTE — Assessment & Plan Note (Signed)
Desires referral to a nutritionist for weight loss.

## 2017-10-22 NOTE — Assessment & Plan Note (Signed)
Physical today 10/19/17.  PAP 06/11/15 - negative with negative HPV.  Mammogram 09/04/17 - Birads I.  Colonoscopy 02/03/15 - hyperplastic polyp.  Recommended f/u colonoscopy in 01/2020.

## 2017-10-22 NOTE — Assessment & Plan Note (Signed)
Off crestor.  Felt was contributing to aching and joint pain.  Low cholesterol diet and exercise.  Follow lipid panel.  Still with discomfort.  Not sure if cholesterol medication contributing.

## 2017-10-22 NOTE — Assessment & Plan Note (Signed)
Controlled on current regimen.   

## 2017-10-22 NOTE — Assessment & Plan Note (Signed)
Increased stress as outlined.  Discussed with her today.  Will increase zoloft to 75mg q day.  Follow.   

## 2017-10-22 NOTE — Assessment & Plan Note (Signed)
Noted on previous EGD.  Currently controlled on current regimen.  Follow.

## 2017-10-22 NOTE — Assessment & Plan Note (Signed)
Low carb diet and exercise.  Follow met b and a1c.   

## 2017-10-22 NOTE — Assessment & Plan Note (Signed)
Has known sleep apnea.  Not using her machine now.  Feels settings may not be right.  See if can obtain a home auto titration.  Used [facility].

## 2017-10-22 NOTE — Assessment & Plan Note (Signed)
Describes persistent right hip and right leg pain.  Check xray.

## 2017-10-30 ENCOUNTER — Ambulatory Visit (INDEPENDENT_AMBULATORY_CARE_PROVIDER_SITE_OTHER): Payer: Medicare Other

## 2017-10-30 ENCOUNTER — Telehealth: Payer: Self-pay | Admitting: Radiology

## 2017-10-30 ENCOUNTER — Other Ambulatory Visit (INDEPENDENT_AMBULATORY_CARE_PROVIDER_SITE_OTHER): Payer: Medicare Other

## 2017-10-30 DIAGNOSIS — R739 Hyperglycemia, unspecified: Secondary | ICD-10-CM | POA: Diagnosis not present

## 2017-10-30 DIAGNOSIS — E7800 Pure hypercholesterolemia, unspecified: Secondary | ICD-10-CM | POA: Diagnosis not present

## 2017-10-30 DIAGNOSIS — M25551 Pain in right hip: Secondary | ICD-10-CM

## 2017-10-30 DIAGNOSIS — R7989 Other specified abnormal findings of blood chemistry: Secondary | ICD-10-CM

## 2017-10-30 LAB — LIPID PANEL
Cholesterol: 260 mg/dL — ABNORMAL HIGH (ref 0–200)
HDL: 51.9 mg/dL (ref >39.00)
NonHDL: 208.39
Total CHOL/HDL Ratio: 5
Triglycerides: 228 mg/dL — ABNORMAL HIGH (ref 0.0–149.0)
VLDL: 45.6 mg/dL — ABNORMAL HIGH (ref 0.0–40.0)

## 2017-10-30 LAB — BASIC METABOLIC PANEL
BUN: 19 mg/dL (ref 6–23)
CO2: 25 mEq/L (ref 19–32)
Calcium: 10.5 mg/dL (ref 8.4–10.5)
Chloride: 105 mEq/L (ref 96–112)
Creatinine, Ser: 0.84 mg/dL (ref 0.40–1.20)
GFR: 72.15 mL/min (ref >60.00)
Glucose, Bld: 110 mg/dL — ABNORMAL HIGH (ref 70–99)
Potassium: 4.1 mEq/L (ref 3.5–5.1)
Sodium: 139 mEq/L (ref 135–145)

## 2017-10-30 LAB — CBC WITH DIFFERENTIAL/PLATELET
Basophils Absolute: 0 10*3/uL (ref 0.0–0.1)
Basophils Relative: 0.7 % (ref 0.0–3.0)
Eosinophils Absolute: 0.2 10*3/uL (ref 0.0–0.7)
Eosinophils Relative: 3.3 % (ref 0.0–5.0)
HCT: 40.3 % (ref 36.0–46.0)
Hemoglobin: 13.9 g/dL (ref 12.0–15.0)
Lymphocytes Relative: 54.3 % — ABNORMAL HIGH (ref 12.0–46.0)
Lymphs Abs: 2.6 10*3/uL (ref 0.7–4.0)
MCHC: 34.5 g/dL (ref 30.0–36.0)
MCV: 88.1 fl (ref 78.0–100.0)
Monocytes Absolute: 0.3 10*3/uL (ref 0.1–1.0)
Monocytes Relative: 6.2 % (ref 3.0–12.0)
Neutro Abs: 1.7 10*3/uL (ref 1.4–7.7)
Neutrophils Relative %: 35.5 % — ABNORMAL LOW (ref 43.0–77.0)
Platelets: 275 10*3/uL (ref 150.0–400.0)
RBC: 4.57 Mil/uL (ref 3.87–5.11)
RDW: 13.6 % (ref 11.5–15.5)
WBC: 4.8 10*3/uL (ref 4.0–10.5)

## 2017-10-30 LAB — LDL CHOLESTEROL, DIRECT: Direct LDL: 166 mg/dL

## 2017-10-30 LAB — HEPATIC FUNCTION PANEL
ALT: 21 U/L (ref 0–35)
AST: 20 U/L (ref 0–37)
Albumin: 4.8 g/dL (ref 3.5–5.2)
Alkaline Phosphatase: 44 U/L (ref 39–117)
Bilirubin, Direct: 0.1 mg/dL (ref 0.0–0.3)
Total Bilirubin: 0.6 mg/dL (ref 0.2–1.2)
Total Protein: 7.6 g/dL (ref 6.0–8.3)

## 2017-10-30 LAB — SEDIMENTATION RATE: Sed Rate: 15 mm/hr (ref 0–30)

## 2017-10-30 LAB — C-REACTIVE PROTEIN: CRP: 0.2 mg/dL — ABNORMAL LOW (ref 0.5–20.0)

## 2017-10-30 LAB — HEMOGLOBIN A1C: Hgb A1c MFr Bld: 6.1 % (ref 4.6–6.5)

## 2017-10-30 LAB — TSH: TSH: 3.98 u[IU]/mL (ref 0.35–4.50)

## 2017-10-30 NOTE — Telephone Encounter (Signed)
error 

## 2017-10-31 ENCOUNTER — Encounter: Payer: Self-pay | Admitting: Internal Medicine

## 2017-10-31 LAB — RHEUMATOID FACTOR: Rhuematoid fact SerPl-aCnc: <14 IU/mL (ref <14)

## 2017-11-15 ENCOUNTER — Other Ambulatory Visit: Payer: Self-pay | Admitting: Internal Medicine

## 2017-12-12 ENCOUNTER — Ambulatory Visit: Payer: Self-pay | Admitting: Dietician

## 2018-01-30 ENCOUNTER — Ambulatory Visit: Payer: Self-pay | Admitting: Internal Medicine

## 2018-06-12 ENCOUNTER — Encounter: Payer: Self-pay | Admitting: Internal Medicine

## 2018-06-13 NOTE — Telephone Encounter (Signed)
Spoke with pt. She does not meet the criteria for covid-19. She has not ran a fever and has only traveled to [place]. Her husband has an URI and on abx. Pt was advised to still come in as scheduled so she can be evaluated.

## 2018-06-14 ENCOUNTER — Ambulatory Visit: Payer: Self-pay | Admitting: Internal Medicine

## 2018-06-22 ENCOUNTER — Encounter: Payer: Self-pay | Admitting: *Deleted

## 2018-06-25 ENCOUNTER — Ambulatory Visit: Payer: Self-pay | Admitting: Internal Medicine

## 2018-06-25 ENCOUNTER — Ambulatory Visit (INDEPENDENT_AMBULATORY_CARE_PROVIDER_SITE_OTHER): Payer: Medicare Other | Admitting: Internal Medicine

## 2018-06-25 ENCOUNTER — Encounter: Payer: Self-pay | Admitting: Internal Medicine

## 2018-06-25 DIAGNOSIS — M25551 Pain in right hip: Secondary | ICD-10-CM

## 2018-06-25 DIAGNOSIS — K219 Gastro-esophageal reflux disease without esophagitis: Secondary | ICD-10-CM | POA: Diagnosis not present

## 2018-06-25 DIAGNOSIS — F439 Reaction to severe stress, unspecified: Secondary | ICD-10-CM

## 2018-06-25 DIAGNOSIS — G4733 Obstructive sleep apnea (adult) (pediatric): Secondary | ICD-10-CM

## 2018-06-25 DIAGNOSIS — Z1239 Encounter for other screening for malignant neoplasm of breast: Secondary | ICD-10-CM

## 2018-06-25 DIAGNOSIS — R739 Hyperglycemia, unspecified: Secondary | ICD-10-CM

## 2018-06-25 DIAGNOSIS — E7800 Pure hypercholesterolemia, unspecified: Secondary | ICD-10-CM

## 2018-06-25 NOTE — Assessment & Plan Note (Signed)
Low carb diet and exercise.  Follow met b and a1c.  

## 2018-06-25 NOTE — Assessment & Plan Note (Signed)
Has a history of sleep apnea.  Not using cpap.  Feels she is sleeping better.  Desires no further testing or intervention.  Follow.

## 2018-06-25 NOTE — Assessment & Plan Note (Signed)
Off crestor.  Felt was contributing to aching and joint pain.  Low cholesterol diet and exercise.  Follow lipid panel.

## 2018-06-25 NOTE — Assessment & Plan Note (Signed)
Doing well on 50mg  zoloft daily.  Does not feel needs any further intervention.  Follow.

## 2018-06-25 NOTE — Progress Notes (Signed)
Virtual Visit via Video Note  I connected with Jessica Peck on 06/25/18 at  8:30 AM EDT by a video enabled telemedicine application and verified that I am speaking with the correct person using two identifiers.  Location patient: home Location provider:work.  Persons participating in the virtual visit: patient, provider  I discussed the limitations of evaluation and management by telemedicine.  This visit type was conducted due to national recommendations for restrictions regarding the COVID-19 pandemic.  This format is felt to be most appropriate for this patient at this time.  The patient expressed understanding and agreed to proceed.   HPI: She is scheduled for a follow up appt.  Last visit was having pain in her right hip and thigh.  Had discussed ortho referral.  She reports hip is better.  Does not limit her activity.  Desires no further intervention at this time.  She was also referred to a nutritionist.  appt not scheduled.  Is still interested.  Will notify me when agreeable.  Discussed diet and exercise.  No chest pain.  No sob.  No acid reflux.  On protonix.  Controlling symptoms.  Will continue current dose for now.  Not using cpap.  States she is out of her routine.  Overall is sleeping better.  Desires no further intervention.  She is taking an otc medication - calm.  Ingredients:  lavender and magnesium. Working well for her.  Discussed increased stress.  Had discussed increasing zoloft to 75mg  q day.  She is only taking 50mg  q day now and feels this is working well.  Does not feel needs a higher dose.  Overall feels things are stable.     ROS: See pertinent positives and negatives per HPI.   Past Medical History:  Diagnosis Date  . Chronic constipation   . Esophagitis   . Gastritis    EGD (hiatal hernia)  . GERD (gastroesophageal reflux disease)   . Hypercholesterolemia   . Sleep apnea    CPAP  . Vitamin D deficiency     Past Surgical History:  Procedure Laterality  Date  . APPENDECTOMY  1976    Family History  Problem Relation Age of Onset  . Colon cancer Father   . Colon cancer Mother   . COPD Mother   . Heart disease Mother   . Breast cancer Sister        died age 30  . Breast cancer Maternal Uncle 60  . Breast cancer Paternal Aunt 94    SOCIAL HX: reviewed.    Current Outpatient Medications:  .  conjugated estrogens (PREMARIN) vaginal cream, Place vaginally as needed., Disp: 42.5 g, Rfl: 2 .  Cyanocobalamin (RA VITAMIN B-12 TR) 1000 MCG TBCR, Take by mouth., Disp: , Rfl:  .  Melatonin 3 MG TABS, Take 0.5 tablets by mouth at bedtime., Disp: , Rfl:  .  pantoprazole (PROTONIX) 40 MG tablet, Take 40 mg by mouth daily., Disp: , Rfl:  .  sertraline (ZOLOFT) 50 MG tablet, TAKE 1 AND 1/2 TABLET PER DAY, Disp: 135 tablet, Rfl: 1 .  Turmeric 500 MG CAPS, Take by mouth daily., Disp: , Rfl:  .  VITAMIN D, CHOLECALCIFEROL, PO, Take 4,000 Units by mouth daily. , Disp: , Rfl:   EXAM:  GENERAL: alert, oriented, appears well and in no acute distress  HEENT: atraumatic, conjunttiva clear, no obvious abnormalities on inspection of external nose.   NECK: normal movements of the head and neck  LUNGS: on inspection no signs of  respiratory distress, breathing rate appears normal, no obvious gross SOB, gasping or wheezing  CV: no obvious cyanosis  PSYCH/NEURO: pleasant and cooperative, no obvious depression or anxiety, speech and thought processing grossly intact  ASSESSMENT AND PLAN:  Discussed the following assessment and plan:  Breast cancer screening - Plan: MM 3D SCREEN BREAST BILATERAL  Gastroesophageal reflux disease, esophagitis presence not specified  Hyperglycemia - Plan: Hemoglobin A1c, Basic metabolic panel  Obstructive sleep apnea  Pure hypercholesterolemia - Plan: CBC with Differential/Platelet, Hepatic function panel, Lipid panel, TSH  Right hip pain  Stress   I discussed the assessment and treatment plan with the patient.  The patient was provided an opportunity to ask questions and all were answered. The patient agreed with the plan and demonstrated an understanding of the instructions.   The patient was advised to call back or seek an in-person evaluation if the symptoms worsen or if the condition fails to improve as anticipated.     Dale Catharine, MD

## 2018-06-25 NOTE — Assessment & Plan Note (Signed)
Controlled on protonix.  Continue current dose for now.  Follow.

## 2018-06-25 NOTE — Assessment & Plan Note (Signed)
Pain is better.  Not limiting her activity.  Desires no further intervention.  Follow.

## 2018-07-04 ENCOUNTER — Telehealth: Payer: Self-pay | Admitting: Internal Medicine

## 2018-07-04 NOTE — Telephone Encounter (Signed)
-----   Message from Larry Sierras, LPN sent at 07/01/2701  9:05 AM EDT ----- Regarding: RE: schedule mammogram Done. I sent her a my chart message.  ----- Message ----- From: Dale Parowan, MD Sent: 06/25/2018  10:20 PM EDT To: Larry Sierras, LPN Subject: schedule mammogram                             Needs mammogram scheduled after 09/05/18 and needs physical scheduled after 10/25/18.  Please notify pt of appt dates and time.  Thanks.    Dr Lorin Picket

## 2018-08-23 ENCOUNTER — Other Ambulatory Visit: Payer: Self-pay

## 2018-09-05 ENCOUNTER — Other Ambulatory Visit (INDEPENDENT_AMBULATORY_CARE_PROVIDER_SITE_OTHER): Payer: Medicare Other

## 2018-09-05 ENCOUNTER — Other Ambulatory Visit: Payer: Self-pay

## 2018-09-05 DIAGNOSIS — E7800 Pure hypercholesterolemia, unspecified: Secondary | ICD-10-CM | POA: Diagnosis not present

## 2018-09-05 DIAGNOSIS — R739 Hyperglycemia, unspecified: Secondary | ICD-10-CM | POA: Diagnosis not present

## 2018-09-05 LAB — CBC WITH DIFFERENTIAL/PLATELET
Basophils Absolute: 0 10*3/uL (ref 0.0–0.1)
Basophils Relative: 0.8 % (ref 0.0–3.0)
Eosinophils Absolute: 0.2 10*3/uL (ref 0.0–0.7)
Eosinophils Relative: 3.3 % (ref 0.0–5.0)
HCT: 41.1 % (ref 36.0–46.0)
Hemoglobin: 13.8 g/dL (ref 12.0–15.0)
Lymphocytes Relative: 55.1 % — ABNORMAL HIGH (ref 12.0–46.0)
Lymphs Abs: 2.5 10*3/uL (ref 0.7–4.0)
MCHC: 33.7 g/dL (ref 30.0–36.0)
MCV: 89.2 fl (ref 78.0–100.0)
Monocytes Absolute: 0.3 10*3/uL (ref 0.1–1.0)
Monocytes Relative: 6.4 % (ref 3.0–12.0)
Neutro Abs: 1.6 10*3/uL (ref 1.4–7.7)
Neutrophils Relative %: 34.4 % — ABNORMAL LOW (ref 43.0–77.0)
Platelets: 260 10*3/uL (ref 150.0–400.0)
RBC: 4.61 Mil/uL (ref 3.87–5.11)
RDW: 13.5 % (ref 11.5–15.5)
WBC: 4.6 10*3/uL (ref 4.0–10.5)

## 2018-09-05 LAB — HEPATIC FUNCTION PANEL
ALT: 23 U/L (ref 0–35)
AST: 20 U/L (ref 0–37)
Albumin: 4.5 g/dL (ref 3.5–5.2)
Alkaline Phosphatase: 49 U/L (ref 39–117)
Bilirubin, Direct: 0.1 mg/dL (ref 0.0–0.3)
Total Bilirubin: 0.5 mg/dL (ref 0.2–1.2)
Total Protein: 6.9 g/dL (ref 6.0–8.3)

## 2018-09-05 LAB — BASIC METABOLIC PANEL
BUN: 17 mg/dL (ref 6–23)
CO2: 25 mEq/L (ref 19–32)
Calcium: 9.7 mg/dL (ref 8.4–10.5)
Chloride: 105 mEq/L (ref 96–112)
Creatinine, Ser: 0.81 mg/dL (ref 0.40–1.20)
GFR: 70.61 mL/min (ref >60.00)
Glucose, Bld: 102 mg/dL — ABNORMAL HIGH (ref 70–99)
Potassium: 4 mEq/L (ref 3.5–5.1)
Sodium: 138 mEq/L (ref 135–145)

## 2018-09-05 LAB — LIPID PANEL
Cholesterol: 245 mg/dL — ABNORMAL HIGH (ref 0–200)
HDL: 53 mg/dL (ref >39.00)
LDL Cholesterol: 158 mg/dL — ABNORMAL HIGH (ref 0–99)
NonHDL: 192.41
Total CHOL/HDL Ratio: 5
Triglycerides: 173 mg/dL — ABNORMAL HIGH (ref 0.0–149.0)
VLDL: 34.6 mg/dL (ref 0.0–40.0)

## 2018-09-05 LAB — HEMOGLOBIN A1C: Hgb A1c MFr Bld: 5.9 % (ref 4.6–6.5)

## 2018-09-05 LAB — TSH: TSH: 4.31 u[IU]/mL (ref 0.35–4.50)

## 2018-09-06 ENCOUNTER — Ambulatory Visit
Admission: RE | Admit: 2018-09-06 | Discharge: 2018-09-06 | Disposition: A | Discharge status: Home / Self Care | Payer: Medicare Other | Source: Ambulatory Visit | Attending: Internal Medicine | Admitting: Internal Medicine

## 2018-09-06 ENCOUNTER — Other Ambulatory Visit: Payer: Self-pay | Admitting: Internal Medicine

## 2018-09-06 DIAGNOSIS — Z1239 Encounter for other screening for malignant neoplasm of breast: Secondary | ICD-10-CM

## 2018-09-06 DIAGNOSIS — D72821 Monocytosis (symptomatic): Secondary | ICD-10-CM

## 2018-09-06 DIAGNOSIS — Z1231 Encounter for screening mammogram for malignant neoplasm of breast: Secondary | ICD-10-CM | POA: Diagnosis not present

## 2018-09-06 NOTE — Progress Notes (Signed)
Order placed for f/u cbc.   

## 2018-09-08 ENCOUNTER — Telehealth: Payer: Self-pay | Admitting: Internal Medicine

## 2018-09-08 NOTE — Telephone Encounter (Signed)
-----   Message from Cammie Sickle, MD sent at 09/08/2018  6:31 AM EDT ----- Regarding: RE: question Hi Danaja [place]ota: Thank you for reaching out to me.  I reviewed the labs again today.  Patient has rising lymphocytosis ; but since the patient's total white count is normal.  Patient might declare herself CLL down the line; however I would not recommend any additional work-up for hematologic malignancy at this time unless patient is having any other unusual systemic symptoms like weight loss or night sweats or infections.   Currently, I would not do anything different at this time.  However I am happy to evaluate the patient if you feel that is necessary.  Regards, GB ----- Message ----- From: Einar Pheasant, MD Sent: 09/05/2018  10:30 PM EDT To: Cammie Sickle, MD Subject: question                                       Ms Offenberger's lymphocyte count has been elevated for a while now.  It appears to be gradually increasing.  Is there anything more that I need to do or any further testing?  If she needs to see you to have this evaluated, let me know and I will arrange an appointment.   Thank you for your help.  Einar Pheasant

## 2018-10-05 ENCOUNTER — Other Ambulatory Visit (INDEPENDENT_AMBULATORY_CARE_PROVIDER_SITE_OTHER): Payer: Medicare Other

## 2018-10-05 ENCOUNTER — Other Ambulatory Visit: Payer: Self-pay

## 2018-10-05 ENCOUNTER — Other Ambulatory Visit: Payer: Self-pay | Admitting: Internal Medicine

## 2018-10-05 DIAGNOSIS — D72821 Monocytosis (symptomatic): Secondary | ICD-10-CM

## 2018-10-05 LAB — CBC WITH DIFFERENTIAL/PLATELET
Basophils Absolute: 0 10*3/uL (ref 0.0–0.1)
Basophils Relative: 0.8 % (ref 0.0–3.0)
Eosinophils Absolute: 0.1 10*3/uL (ref 0.0–0.7)
Eosinophils Relative: 2.9 % (ref 0.0–5.0)
HCT: 39.9 % (ref 36.0–46.0)
Hemoglobin: 13.3 g/dL (ref 12.0–15.0)
Lymphocytes Relative: 48.3 % — ABNORMAL HIGH (ref 12.0–46.0)
Lymphs Abs: 2.2 10*3/uL (ref 0.7–4.0)
MCHC: 33.3 g/dL (ref 30.0–36.0)
MCV: 90.8 fl (ref 78.0–100.0)
Monocytes Absolute: 0.2 10*3/uL (ref 0.1–1.0)
Monocytes Relative: 4.9 % (ref 3.0–12.0)
Neutro Abs: 2 10*3/uL (ref 1.4–7.7)
Neutrophils Relative %: 43.1 % (ref 43.0–77.0)
Platelets: 254 10*3/uL (ref 150.0–400.0)
RBC: 4.39 Mil/uL (ref 3.87–5.11)
RDW: 13.7 % (ref 11.5–15.5)
WBC: 4.7 10*3/uL (ref 4.0–10.5)

## 2018-10-06 ENCOUNTER — Encounter: Payer: Self-pay | Admitting: Internal Medicine

## 2018-11-02 ENCOUNTER — Ambulatory Visit: Payer: Self-pay | Admitting: Internal Medicine

## 2018-11-14 ENCOUNTER — Ambulatory Visit: Payer: Self-pay | Admitting: Internal Medicine

## 2018-12-17 ENCOUNTER — Other Ambulatory Visit: Payer: Self-pay

## 2018-12-17 ENCOUNTER — Ambulatory Visit: Payer: Medicare Other | Admitting: Internal Medicine

## 2018-12-17 ENCOUNTER — Encounter: Payer: Self-pay | Admitting: Internal Medicine

## 2018-12-17 ENCOUNTER — Other Ambulatory Visit (HOSPITAL_COMMUNITY)
Admission: RE | Admit: 2018-12-17 | Discharge: 2018-12-17 | Disposition: A | Discharge status: Home / Self Care | Payer: Medicare Other | Source: Ambulatory Visit | Attending: Internal Medicine | Admitting: Internal Medicine

## 2018-12-17 VITALS — BP 102/70 | HR 84 | Temp 97.0°F | Ht 59.45 in | Wt 139.2 lb

## 2018-12-17 DIAGNOSIS — R739 Hyperglycemia, unspecified: Secondary | ICD-10-CM | POA: Diagnosis not present

## 2018-12-17 DIAGNOSIS — K219 Gastro-esophageal reflux disease without esophagitis: Secondary | ICD-10-CM | POA: Diagnosis not present

## 2018-12-17 DIAGNOSIS — Z124 Encounter for screening for malignant neoplasm of cervix: Secondary | ICD-10-CM | POA: Diagnosis not present

## 2018-12-17 DIAGNOSIS — F439 Reaction to severe stress, unspecified: Secondary | ICD-10-CM

## 2018-12-17 DIAGNOSIS — Z1151 Encounter for screening for human papillomavirus (HPV): Secondary | ICD-10-CM | POA: Insufficient documentation

## 2018-12-17 DIAGNOSIS — Z0000 Encounter for general adult medical examination without abnormal findings: Secondary | ICD-10-CM | POA: Diagnosis not present

## 2018-12-17 DIAGNOSIS — L989 Disorder of the skin and subcutaneous tissue, unspecified: Secondary | ICD-10-CM

## 2018-12-17 DIAGNOSIS — E7800 Pure hypercholesterolemia, unspecified: Secondary | ICD-10-CM

## 2018-12-17 MED ORDER — PANTOPRAZOLE SODIUM 20 MG PO TBEC: DELAYED_RELEASE_TABLET | ORAL | 1 refills | Status: DC

## 2018-12-17 NOTE — Progress Notes (Signed)
Patient ID: Jessica Peck, female   DOB: Jul 02, 1950, 68 y.o.   MRN: [id number]   Subjective:    Patient ID: Jessica Peck, female    DOB: 10-07-1950, 68 y.o.   MRN: [id number]  HPI  Patient here for her physical exam.  She reports she is doing relatively well.  Acid reflux controlled.  Discussed changing protonix to 29m bid.  Handling stress.  On zoloft.  Taking 1 1/2 q day.  Doing well on this medication.  Off crestor.  Trying to stay active.  No chest pain.  No sob.  No abdominal pain.  Bowels moving.  Has persistent scalp lesion.  Discussed dermatology referral.  Does report legs don't feel as strong.  No unsteadiness.  Discussed further evaluation.  Discussed PT.  She wants to start exercising more and see if improved.  Will follow.  Will notify me if desires any further w/up, evaluation or treatment.     Past Medical History:  Diagnosis Date  . Chronic constipation   . Esophagitis   . Gastritis    EGD (hiatal hernia)  . GERD (gastroesophageal reflux disease)   . Hypercholesterolemia   . Sleep apnea    CPAP  . Vitamin D deficiency    Past Surgical History:  Procedure Laterality Date  . APPENDECTOMY  1976   Family History  Problem Relation Age of Onset  . Colon cancer Father   . Colon cancer Mother   . COPD Mother   . Heart disease Mother   . Breast cancer Sister        died age 67 . Breast cancer Maternal Uncle 60  . Breast cancer Paternal AAunt 33  Social History   Socioeconomic History  . Marital status: Married    Spouse name: Not on file  . Number of children: 2  . Years of education: Not on file  . Highest education level: Not on file  Occupational History  . Not on file  Social Needs  . Financial resource strain: Not on file  . Food insecurity    Worry: Not on file    Inability: Not on file  . Transportation needs    Medical: Not on file    Non-medical: Not on file  Tobacco Use  . Smoking status: Never Smoker  . Smokeless tobacco: Never Used   Substance and Sexual Activity  . Alcohol use: Yes    Alcohol/week: 0.0 standard drinks    Comment: occasional  . Drug use: No  . Sexual activity: Not on file  Lifestyle  . Physical activity    Days per week: Not on file    Minutes per session: Not on file  . Stress: Not on file  Relationships  . Social [place] phone: Not on file    Gets together: Not on file    Attends religious service: Not on file    Active member of club or organization: Not on file    Attends meetings of clubs or organizations: Not on file    Relationship status: Not on file  Other Topics Concern  . Not on file  Social History Narrative  . Not on file    Outpatient Encounter Medications as of 12/17/2018  Medication Sig  . conjugated estrogens (PREMARIN) vaginal cream Place vaginally as needed.  . Cyanocobalamin (RA VITAMIN B-12 TR) 1000 MCG TBCR Take by mouth.  . Melatonin 3 MG TABS Take 0.5 tablets by mouth at bedtime.  .Marland Kitchen  sertraline (ZOLOFT) 50 MG tablet TAKE 1 AND 1/2 TABLET PER DAY  . VITAMIN D, CHOLECALCIFEROL, PO Take 4,000 Units by mouth daily.   . [DISCONTINUED] pantoprazole (PROTONIX) 40 MG tablet Take 40 mg by mouth daily.  . pantoprazole (PROTONIX) 20 MG tablet One to two tablets q day  . Turmeric 500 MG CAPS Take by mouth daily.   No facility-administered encounter medications on file as of 12/17/2018.     Review of Systems  Constitutional: Negative for appetite change and unexpected weight change.  HENT: Negative for congestion and sinus pressure.   Eyes: Negative for pain and visual disturbance.  Respiratory: Negative for cough, chest tightness and shortness of breath.   Cardiovascular: Negative for chest pain, palpitations and leg swelling.  Gastrointestinal: Negative for abdominal pain, diarrhea, nausea and vomiting.  Genitourinary: Negative for difficulty urinating and dysuria.  Musculoskeletal: Negative for joint swelling and myalgias.  Skin: Negative for color  change and rash.  Neurological: Negative for dizziness, light-headedness and headaches.  Hematological: Negative for adenopathy. Does not bruise/bleed easily.  Psychiatric/Behavioral: Negative for agitation and dysphoric mood.       Objective:    Physical Exam Constitutional:      General: She is not in acute distress.    Appearance: Normal appearance. She is well-developed.  HENT:     Right Ear: External ear normal.     Left Ear: External ear normal.  Eyes:     General: No scleral icterus.       Right eye: No discharge.        Left eye: No discharge.     Conjunctiva/sclera: Conjunctivae normal.  Neck:     Musculoskeletal: Neck supple. No muscular tenderness.     Thyroid: No thyromegaly.  Cardiovascular:     Rate and Rhythm: Normal rate and regular rhythm.  Pulmonary:     Effort: No tachypnea, accessory muscle usage or respiratory distress.     Breath sounds: Normal breath sounds. No decreased breath sounds or wheezing.  Chest:     Breasts:        Right: No inverted nipple, mass, nipple discharge or tenderness (no axillary adenopathy).        Left: No inverted nipple, mass, nipple discharge or tenderness (no axilarry adenopathy).  Abdominal:     General: Bowel sounds are normal.     Palpations: Abdomen is soft.     Tenderness: There is no abdominal tenderness.  Genitourinary:    Comments: Normal external genitalia.  Vaginal vault without lesions.  Cervix identified.  Pap smear performed.  Could not appreciate any adnexal masses or tenderness.   Musculoskeletal:        General: No swelling or tenderness.  Lymphadenopathy:     Cervical: No cervical adenopathy.  Skin:    Findings: No erythema or rash.  Neurological:     Mental Status: She is alert and oriented to person, place, and time.  Psychiatric:        Mood and Affect: Mood normal.        Behavior: Behavior normal.     BP 102/70 (BP Location: Left Arm, Patient Position: Sitting, Cuff Size: Normal)   Pulse 84    Temp (!) 97 F (36.1 C) (Temporal)   Ht 4' 11.45" (1.51 m)   Wt 139 lb 3.2 oz (63.1 kg)   LMP 06/04/2000   SpO2 94%   BMI 27.69 kg/m  Wt Readings from Last 3 Encounters:  12/17/18 139 lb 3.2 oz (63.1 kg)  10/19/17 140 lb (63.5 kg)  05/24/17 138 lb 6.4 oz (62.8 kg)     Lab Results  Component Value Date   WBC 4.7 10/05/2018   HGB 13.3 10/05/2018   HCT 39.9 10/05/2018   PLT 254.0 10/05/2018   GLUCOSE 102 (H) 09/05/2018   CHOL 245 (H) 09/05/2018   TRIG 173.0 (H) 09/05/2018   HDL 53.00 09/05/2018   LDLDIRECT 166.0 10/30/2017   LDLCALC 158 (H) 09/05/2018   ALT 23 09/05/2018   AST 20 09/05/2018   NA 138 09/05/2018   K 4.0 09/05/2018   CL 105 09/05/2018   CREATININE 0.81 09/05/2018   BUN 17 09/05/2018   CO2 25 09/05/2018   TSH 4.31 09/05/2018   HGBA1C 5.9 09/05/2018    Mm 3d Screen Breast Bilateral  Result Date: 09/06/2018 CLINICAL DATA:  Screening. EXAM: DIGITAL SCREENING BILATERAL MAMMOGRAM WITH TOMO AND CAD COMPARISON:  Previous exam(s). ACR Breast Density Category b: There are scattered areas of fibroglandular density. FINDINGS: There are no findings suspicious for malignancy. Images were processed with CAD. IMPRESSION: No mammographic evidence of malignancy. A result letter of this screening mammogram will be mailed directly to the patient. RECOMMENDATION: Screening mammogram in one year. (Code:SM-B-01Y) BI-RADS CATEGORY  1: Negative. Electronically Signed   By: Nolon Nations M.D.   On: 09/06/2018 16:19       Assessment & Plan:   Problem List Items Addressed This Visit    GERD (gastroesophageal reflux disease)    Controlled.  Change protonix to 62m bid.  Follow.        Relevant Medications   pantoprazole (PROTONIX) 20 MG tablet   Health care maintenance    Physical today 12/17/18.  PAP 12/17/18.  Mammogram 09/06/18 - Birads I.  Colonoscopy 02/03/15 - hyperplastic polyp.  Recommended f/u colonoscopy in 01/2020.        Hyperglycemia    Low carb diet and  exercise.  Follow met b and a1c.       Relevant Orders   Hemoglobin AY0D  Basic metabolic panel   Pure hypercholesterolemia    Off crestor.  Felt was contributing to aching.  Low cholesterol diet and exercise.  Follow lipid panel.       Relevant Orders   Hepatic function panel   Lipid panel   Scalp lesion    Persistent scalp lesion.  Refer to dermatology.       Relevant Orders   Ambulatory referral to Dermatology   Stress    Doing well on zoloft.  Taking 1 1/2 tablet per day.  Follow.         Other Visit Diagnoses    Cervical cancer screening       Relevant Orders   Cytology - PAP( [place]) (Completed)       CEinar Pheasant MD

## 2018-12-19 LAB — CYTOLOGY - PAP
Diagnosis: NEGATIVE
High risk HPV: NEGATIVE
Molecular Disclaimer: 56
Molecular Disclaimer: DETECTED
Molecular Disclaimer: NORMAL

## 2018-12-20 ENCOUNTER — Encounter: Payer: Self-pay | Admitting: Internal Medicine

## 2018-12-22 ENCOUNTER — Encounter: Payer: Self-pay | Admitting: Internal Medicine

## 2018-12-22 DIAGNOSIS — L989 Disorder of the skin and subcutaneous tissue, unspecified: Secondary | ICD-10-CM | POA: Insufficient documentation

## 2018-12-22 NOTE — Assessment & Plan Note (Signed)
Persistent scalp lesion.  Refer to dermatology.

## 2018-12-22 NOTE — Assessment & Plan Note (Signed)
Off crestor.  Felt was contributing to aching.  Low cholesterol diet and exercise.  Follow lipid panel.  

## 2018-12-22 NOTE — Assessment & Plan Note (Signed)
Doing well on zoloft.  Taking 1 1/2 tablet per day.  Follow.

## 2018-12-22 NOTE — Assessment & Plan Note (Signed)
Controlled.  Change protonix to 20mg  bid.  Follow.

## 2018-12-22 NOTE — Assessment & Plan Note (Signed)
Low carb diet and exercise.  Follow met b and a1c.  

## 2018-12-22 NOTE — Assessment & Plan Note (Signed)
Physical today 12/17/18.  PAP 12/17/18.  Mammogram 09/06/18 - Birads I.  Colonoscopy 02/03/15 - hyperplastic polyp.  Recommended f/u colonoscopy in 01/2020.

## 2018-12-31 ENCOUNTER — Other Ambulatory Visit: Payer: Self-pay | Admitting: Internal Medicine

## 2019-01-03 ENCOUNTER — Other Ambulatory Visit: Payer: Medicare Other

## 2019-01-11 ENCOUNTER — Other Ambulatory Visit: Payer: Self-pay

## 2019-01-11 ENCOUNTER — Other Ambulatory Visit (INDEPENDENT_AMBULATORY_CARE_PROVIDER_SITE_OTHER): Payer: Medicare Other

## 2019-01-11 DIAGNOSIS — R739 Hyperglycemia, unspecified: Secondary | ICD-10-CM | POA: Diagnosis not present

## 2019-01-11 DIAGNOSIS — E7800 Pure hypercholesterolemia, unspecified: Secondary | ICD-10-CM | POA: Diagnosis not present

## 2019-01-11 LAB — BASIC METABOLIC PANEL
BUN: 21 mg/dL (ref 6–23)
CO2: 26 mEq/L (ref 19–32)
Calcium: 10.1 mg/dL (ref 8.4–10.5)
Chloride: 105 mEq/L (ref 96–112)
Creatinine, Ser: 0.81 mg/dL (ref 0.40–1.20)
GFR: 70.54 mL/min (ref >60.00)
Glucose, Bld: 109 mg/dL — ABNORMAL HIGH (ref 70–99)
Potassium: 4.5 mEq/L (ref 3.5–5.1)
Sodium: 140 mEq/L (ref 135–145)

## 2019-01-11 LAB — HEPATIC FUNCTION PANEL
ALT: 24 U/L (ref 0–35)
AST: 19 U/L (ref 0–37)
Albumin: 4.7 g/dL (ref 3.5–5.2)
Alkaline Phosphatase: 53 U/L (ref 39–117)
Bilirubin, Direct: 0.1 mg/dL (ref 0.0–0.3)
Total Bilirubin: 0.5 mg/dL (ref 0.2–1.2)
Total Protein: 7 g/dL (ref 6.0–8.3)

## 2019-01-11 LAB — LIPID PANEL
Cholesterol: 252 mg/dL — ABNORMAL HIGH (ref 0–200)
HDL: 51.2 mg/dL (ref >39.00)
NonHDL: 201.07
Total CHOL/HDL Ratio: 5
Triglycerides: 220 mg/dL — ABNORMAL HIGH (ref 0.0–149.0)
VLDL: 44 mg/dL — ABNORMAL HIGH (ref 0.0–40.0)

## 2019-01-11 LAB — LDL CHOLESTEROL, DIRECT: Direct LDL: 155 mg/dL

## 2019-01-11 LAB — HEMOGLOBIN A1C: Hgb A1c MFr Bld: 6.1 % (ref 4.6–6.5)

## 2019-01-12 ENCOUNTER — Encounter: Payer: Self-pay | Admitting: Internal Medicine

## 2019-01-13 NOTE — Telephone Encounter (Signed)
Please send her a copy of [facility] lipid diet.

## 2019-01-14 NOTE — Telephone Encounter (Signed)
Mailed to pt

## 2019-03-15 ENCOUNTER — Other Ambulatory Visit: Payer: Self-pay | Admitting: Internal Medicine

## 2019-06-17 ENCOUNTER — Other Ambulatory Visit: Payer: Self-pay

## 2019-06-17 ENCOUNTER — Telehealth (INDEPENDENT_AMBULATORY_CARE_PROVIDER_SITE_OTHER): Payer: Medicare PPO | Admitting: Internal Medicine

## 2019-06-17 DIAGNOSIS — R739 Hyperglycemia, unspecified: Secondary | ICD-10-CM

## 2019-06-17 DIAGNOSIS — Z86010 Personal history of colonic polyps: Secondary | ICD-10-CM

## 2019-06-17 DIAGNOSIS — E7800 Pure hypercholesterolemia, unspecified: Secondary | ICD-10-CM | POA: Diagnosis not present

## 2019-06-17 DIAGNOSIS — K209 Esophagitis, unspecified without bleeding: Secondary | ICD-10-CM

## 2019-06-17 DIAGNOSIS — K219 Gastro-esophageal reflux disease without esophagitis: Secondary | ICD-10-CM

## 2019-06-17 DIAGNOSIS — F439 Reaction to severe stress, unspecified: Secondary | ICD-10-CM | POA: Diagnosis not present

## 2019-06-17 DIAGNOSIS — M25519 Pain in unspecified shoulder: Secondary | ICD-10-CM | POA: Diagnosis not present

## 2019-06-17 NOTE — Progress Notes (Signed)
Patient ID: Jessica Peck, female   DOB: [birth year], 69 y.o.   MRN: [id number]   Virtual Visit via video Note  This visit type was conducted due to national recommendations for restrictions regarding the COVID-19 pandemic (e.g. social distancing).  This format is felt to be most appropriate for this patient at this time.  All issues noted in this document were discussed and addressed.  No physical exam was performed (except for noted visual exam findings with Video Visits).   I connected with [name] by a video enabled telemedicine application and verified that I am speaking with the correct person using two identifiers. Location patient: home Location provider: work  Persons participating in the virtual visit: patient, provider  The limitations, risks, security and privacy concerns of performing an evaluation and management service by video and the availability of in person appointments have been discussed.  The patient expressed understanding and agreed to proceed.   Reason for visit:  Scheduled follow up.   HPI: Retired.  Tries to stay active.  Has been having problems wih her right shoulder.  States - bone on bone.  Planing f/u regarding her shoulder.  No chest pain or sob reported.  No abdominal pain.  Bowels moving.  Is walking.  Discussed low carb diet.  Increased stress.  Plans to f/u with counselor.  On no cholesterol medication.  No headache or dizziness reported.     ROS: See pertinent positives and negatives per HPI.  Past Medical History:  Diagnosis Date  . Chronic constipation   . Esophagitis   . Gastritis    EGD (hiatal hernia)  . GERD (gastroesophageal reflux disease)   . Hypercholesterolemia   . Sleep apnea    CPAP  . Vitamin D deficiency     Past Surgical History:  Procedure Laterality Date  . APPENDECTOMY  1976    Family History  Problem Relation Age of Onset  . Colon cancer Father   . Colon cancer Mother   . COPD Mother   . Heart disease  Mother   . Breast cancer Sister        died age 73  . Breast cancer Maternal Uncle 60  . Breast cancer Paternal Aunt 50    SOCIAL HX: reviewed.     Current Outpatient Medications:  .  conjugated estrogens (PREMARIN) vaginal cream, Place vaginally as needed., Disp: 42.5 g, Rfl: 2 .  Cyanocobalamin (RA VITAMIN B-12 TR) 1000 MCG TBCR, Take by mouth., Disp: , Rfl:  .  Melatonin 3 MG TABS, Take 0.5 tablets by mouth at bedtime., Disp: , Rfl:  .  pantoprazole (PROTONIX) 20 MG tablet, ONE TO TWO TABLETS EVERY DAY, Disp: 180 tablet, Rfl: 1 .  sertraline (ZOLOFT) 50 MG tablet, TAKE 1 AND 1/2 TABLET PER DAY, Disp: 135 tablet, Rfl: 1 .  Turmeric 500 MG CAPS, Take by mouth daily., Disp: , Rfl:  .  VITAMIN D, CHOLECALCIFEROL, PO, Take 4,000 Units by mouth daily. , Disp: , Rfl:   EXAM:  GENERAL: alert, oriented, appears well and in no acute distress  HEENT: atraumatic, conjunttiva clear, no obvious abnormalities on inspection of external nose and ears  NECK: normal movements of the head and neck  LUNGS: on inspection no signs of respiratory distress, breathing rate appears normal, no obvious gross SOB, gasping or wheezing  CV: no obvious cyanosis  PSYCH/NEURO: pleasant and cooperative, no obvious depression or anxiety, speech and thought processing grossly intact  ASSESSMENT AND PLAN:  Discussed the following  assessment and plan:  Esophagitis Noted on previous EGD.  On protonix.  Controlled.   GERD (gastroesophageal reflux disease) Controlled on protonix.    History of colonic polyps Colonoscopy 01/2015.  Recommended f/u in 01/2020.    Hyperglycemia Low carb diet and exercise.  Follow met b and a1c.    Pure hypercholesterolemia Off crestor.  Felt was contributing to aching.  Low cholesterol diet and exercise.  Follow lipid panel.   Shoulder pain Previous injection. Persistent pain.  States - bone on bone.  Planning to f/u regarding her shoulder.    Stress Increased stress.   On zoloft.  Plans to f/u with counselor.  Follow.    Orders Placed This Encounter  Procedures  . CBC with Differential/Platelet    Standing Status:   Future    Standing Expiration Date:   06/22/2020  . Hemoglobin A1c    Standing Status:   Future    Standing Expiration Date:   06/22/2020  . Hepatic function panel    Standing Status:   Future    Standing Expiration Date:   06/22/2020  . Lipid panel    Standing Status:   Future    Standing Expiration Date:   06/22/2020  . TSH    Standing Status:   Future    Standing Expiration Date:   06/22/2020  . Basic metabolic panel    Standing Status:   Future    Standing Expiration Date:   06/22/2020     I discussed the assessment and treatment plan with the patient. The patient was provided an opportunity to ask questions and all were answered. The patient agreed with the plan and demonstrated an understanding of the instructions.   The patient was advised to call back or seek an in-person evaluation if the symptoms worsen or if the condition fails to improve as anticipated.    Einar Pheasant, MD

## 2019-06-23 ENCOUNTER — Encounter: Payer: Self-pay | Admitting: Internal Medicine

## 2019-06-23 NOTE — Assessment & Plan Note (Signed)
Off crestor.  Felt was contributing to aching.  Low cholesterol diet and exercise.  Follow lipid panel.

## 2019-06-23 NOTE — Assessment & Plan Note (Signed)
Low carb diet and exercise.  Follow met b and a1c.   

## 2019-06-23 NOTE — Assessment & Plan Note (Signed)
Previous injection. Persistent pain.  States - bone on bone.  Planning to f/u regarding her shoulder.

## 2019-06-23 NOTE — Assessment & Plan Note (Signed)
Colonoscopy 01/2015.  Recommended f/u in 01/2020.

## 2019-06-23 NOTE — Assessment & Plan Note (Signed)
Increased stress.  On zoloft.  Plans to f/u with counselor.  Follow.

## 2019-06-23 NOTE — Assessment & Plan Note (Signed)
Noted on previous EGD.  On protonix.  Controlled.

## 2019-06-23 NOTE — Assessment & Plan Note (Signed)
Controlled on protonix.   

## 2019-07-01 ENCOUNTER — Other Ambulatory Visit: Payer: Self-pay

## 2019-07-01 ENCOUNTER — Ambulatory Visit (INDEPENDENT_AMBULATORY_CARE_PROVIDER_SITE_OTHER): Payer: Medicare PPO

## 2019-07-01 VITALS — Ht 59.0 in | Wt 135.0 lb

## 2019-07-01 DIAGNOSIS — Z1211 Encounter for screening for malignant neoplasm of colon: Secondary | ICD-10-CM | POA: Diagnosis not present

## 2019-07-01 DIAGNOSIS — Z1231 Encounter for screening mammogram for malignant neoplasm of breast: Secondary | ICD-10-CM

## 2019-07-01 DIAGNOSIS — Z0000 Encounter for general adult medical examination without abnormal findings: Secondary | ICD-10-CM | POA: Diagnosis not present

## 2019-07-01 NOTE — Progress Notes (Addendum)
Subjective:   Jessica Peck is a 69 y.o. female who presents for an Initial Medicare Annual Wellness Visit.  Review of Systems    No ROS.  Medicare Wellness Virtual Visit.  Visual/audio telehealth visit, UTA vital signs.   Ht/Wt provided. See social history for additional risk factors.    Cardiac Risk Factors include: advanced age (>57men, >67 women)     Objective:    Today's Vitals   07/01/19 1240  Weight: 135 lb (61.2 kg)  Height: 4\' 11"  (1.499 m)   Body mass index is 27.27 kg/m.  Advanced Directives 07/01/2019  Does Patient Have a Medical Advance Directive? Yes  Type of 08/31/2019 of [place];Living will  Does patient want to make changes to medical advance directive? No - Patient declined  Copy of Healthcare Power of Attorney in Chart? No - copy requested    Current Medications (verified) Outpatient Encounter Medications as of 07/01/2019  Medication Sig  . conjugated estrogens (PREMARIN) vaginal cream Place vaginally as needed.  . Cyanocobalamin (RA VITAMIN B-12 TR) 1000 MCG TBCR Take by mouth.  . Melatonin 3 MG TABS Take 0.5 tablets by mouth at bedtime.  . pantoprazole (PROTONIX) 20 MG tablet ONE TO TWO TABLETS EVERY DAY  . sertraline (ZOLOFT) 50 MG tablet TAKE 1 AND 1/2 TABLET PER DAY  . Turmeric 500 MG CAPS Take by mouth daily.  08/31/2019 VITAMIN D, CHOLECALCIFEROL, PO Take 4,000 Units by mouth daily.    No facility-administered encounter medications on file as of 07/01/2019.    Allergies (verified) Penicillins   History: Past Medical History:  Diagnosis Date  . Chronic constipation   . Esophagitis   . Gastritis    EGD (hiatal hernia)  . GERD (gastroesophageal reflux disease)   . Hypercholesterolemia   . Sleep apnea    CPAP  . Vitamin D deficiency    Past Surgical History:  Procedure Laterality Date  . APPENDECTOMY  1976   Family History  Problem Relation Age of Onset  . Colon cancer Father   . Colon cancer Mother   . COPD  Mother   . Heart disease Mother   . Breast cancer Sister        died age 74  . Breast cancer Maternal Uncle 60  . Breast cancer Paternal Aunt 6   Social History   Socioeconomic History  . Marital status: Married    Spouse name: Not on file  . Number of children: 2  . Years of education: Not on file  . Highest education level: Not on file  Occupational History  . Not on file  Tobacco Use  . Smoking status: Never Smoker  . Smokeless tobacco: Never Used  Substance and Sexual Activity  . Alcohol use: Yes    Alcohol/week: 0.0 standard drinks    Comment: occasional  . Drug use: No  . Sexual activity: Not on file  Other Topics Concern  . Not on file  Social History Narrative  . Not on file   Social Determinants of Health   Financial Resource Strain:   . Difficulty of Paying Living Expenses:   Food Insecurity:   . Worried About 68 in the Last Year:   . Programme researcher, broadcasting/film/video in the Last Year:   Transportation Needs:   . Barista (Medical):   Freight forwarder Lack of Transportation (Non-Medical):   Physical Activity:   . Days of Exercise per Week:   . Minutes of Exercise per  Session:   Stress:   . Feeling of Stress :   Social Connections:   . Frequency of Communication with Friends and Family:   . Frequency of Social Gatherings with Friends and Family:   . Attends Religious Services:   . Active Member of Clubs or Organizations:   . Attends Banker Meetings:   Marland Kitchen Marital Status:     Tobacco Counseling Counseling given: Not Answered   Clinical Intake:  Pre-visit preparation completed: Yes        Diabetes: No  How often do you need to have someone help you when you read instructions, pamphlets, or other written materials from your doctor or pharmacy?: 1 - Never  Interpreter Needed?: No      Activities of Daily Living In your present state of health, do you have any difficulty performing the following activities: 07/01/2019    Hearing? N  Vision? N  Difficulty concentrating or making decisions? N  Walking or climbing stairs? Y  Comment R leg weakness  Dressing or bathing? N  Doing errands, shopping? N  Preparing Food and eating ? N  Using the Toilet? N  In the past six months, have you accidently leaked urine? N  Do you have problems with loss of bowel control? N  Managing your Medications? N  Managing your Finances? N  Housekeeping or managing your Housekeeping? N  Some recent data might be hidden     Immunizations and Health Maintenance Immunization History  Administered Date(s) Administered  . Influenza Split 01/02/2013, 01/14/2014, 01/20/2016  . Influenza,inj,Quad PF,6+ Mos 12/26/2016  . Influenza-Unspecified 01/27/2018  . PFIZER SARS-COV-2 Vaccination 04/01/2019, 04/22/2019  . Pneumococcal Conjugate-13 04/10/2018  . Td 09/11/2011  . Zoster 01/20/2016  . Zoster Recombinat (Shingrix) 10/18/2018, 01/01/2019   Health Maintenance Due  Topic Date Due  . Hepatitis C Screening  Never done  . COLONOSCOPY  02/14/2019  . PNA vac Low Risk Adult (2 of 2 - PPSV23) 04/11/2019    Patient Care Team: Dale [place], MD as PCP - General (Internal Medicine)  Indicate any recent Medical Services you may have received from other than [facility] providers in the past year (date may be approximate).     Assessment:   This is a routine wellness examination for Jessica Peck.  Nurse connected with patient 07/01/19 at 12:30 PM EDT by a telephone enabled telemedicine application and verified that I am speaking with the correct person using two identifiers. Patient stated full name and DOB. Patient gave permission to continue with virtual visit. Patient's location was at home and Nurse's location was at [place] office.   Patient is alert and oriented x3. Patient denies difficulty focusing or concentrating. 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  Health Maintenance Due: -PNA vaccine-  discussed; to be completed with doctor in visit or local pharmacy.  -Colonoscopy- consent given to place order. -Mammogram- Prefers annual visits due to family hx. Ordered; phone number provided for [facility] [phone]. Agrees she will schedule on or after 09/06/19 to keep recommended annual date.  See completed HM at the end of note.   Eye: Visual acuity not assessed. Virtual visit. Followed by their ophthalmologist.  Dental: Visits every 6 months.    Hearing: Demonstrates normal hearing during visit.  Safety:  Patient feels safe at home- yes Patient does have smoke detectors at home- yes Patient does wear sunscreen or protective clothing when in direct sunlight - yes Patient does wear seat belt when in  a moving vehicle - yes Patient drives- yes Adequate lighting in walkways free from debris- yes Grab bars and handrails used as appropriate- yes Ambulates with an assistive device- no  Social: Alcohol intake - yes      Smoking history- never   Smokers in home? none Illicit drug use? none  Medication: Taking as directed and without issues.  Self managed - yes   Cpap- not in use  Covid-19: Precautions and sickness symptoms discussed. Wears mask, social distancing, hand hygiene as appropriate.   Activities of Daily Living Patient denies needing assistance with: household chores, feeding themselves, getting from bed to chair, getting to the toilet, bathing/showering, dressing, managing money, or preparing meals.   Discussed the importance of a healthy diet, water intake and the benefits of aerobic exercise.   Physical activity- walking, no routine.  Diet:  Regular Water: 6 ounces cups daily. Encouraged to increase intake, stay hydrated. Caffeine: 2 cups of coffee, 1 small coke  Other Providers Patient Care Team: Einar Pheasant, MD as PCP - General (Internal Medicine)  Hearing/Vision screen  Hearing Screening   125Hz  250Hz  500Hz  1000Hz  2000Hz  3000Hz   4000Hz  6000Hz  8000Hz   Right ear:           Left ear:           Comments: Patient is able to hear conversational tones without difficulty.  No issues reported.  Vision Screening Comments: Wears corrective lenses Visual acuity not assessed, virtual visit.  They have seen their ophthalmologist in the last 12 months.     Dietary issues and exercise activities discussed: Current Exercise Habits: Home exercise routine, Type of exercise: walking  Goals    . I would like to increase water intake, eat low carb diet and lose 10-15lbs      Depression Screen PHQ 2/9 Scores 07/01/2019 12/17/2018 12/26/2016 06/11/2015 12/11/2014  PHQ - 2 Score 0 0 0 0 0  PHQ- 9 Score - - 0 - -    Fall Risk Fall Risk  07/01/2019 12/17/2018 12/26/2016 12/11/2014  Falls in the past year? 0 0 No No  Follow up Falls evaluation completed;Falls prevention discussed Falls evaluation completed - -    Timed Get Up and Go Performed no, virtual visit  Cognitive Function:     6CIT Screen 07/01/2019  What Year? 0 points  What month? 0 points  What time? 0 points    Screening Tests Health Maintenance  Topic Date Due  . Hepatitis C Screening  Never done  . COLONOSCOPY  02/14/2019  . PNA vac Low Risk Adult (2 of 2 - PPSV23) 04/11/2019  . INFLUENZA VACCINE  10/27/2019  . MAMMOGRAM  09/05/2020  . TETANUS/TDAP  09/10/2021  . DEXA SCAN  Completed     Plan:  Keep all routine maintenance appointments.  Follow up 07/05/19 @ 1130 for chronic shoulder and leg bone pain. Requests referral for specialist.    Next scheduled lab 09/23/19   Cpe 09/25/19  Medicare Attestation I have personally reviewed: The patient's medical and social history Their use of alcohol, tobacco or illicit drugs Their current medications and supplements The patient's functional ability including ADLs,fall risks, home safety risks, cognitive, and hearing and visual impairment Diet and physical activities Evidence for depression   I have reviewed and  discussed with patient certain preventive protocols, quality metrics, and best practice recommendations.      Varney Biles, LPN   07/02/9627    Reviewed above information.  Agree with assessment and plan.  Dr Nicki Reaper

## 2019-07-01 NOTE — Patient Instructions (Addendum)
Jessica Peck , Thank you for taking time to come for your Medicare Wellness Visit. I appreciate your ongoing commitment to your health goals. Please review the following plan we discussed and let me know if I can assist you in the future.   These are the goals we discussed: Goals    . I would like to increase water intake, eat low carb diet and lose 10-15lbs       This is a list of the screening recommended for you and due dates:  Health Maintenance  Topic Date Due  .  Hepatitis C: One time screening is recommended by Center for Disease Control  (CDC) for  adults born from [birth year] through 1964.   Never done  . Colon Cancer Screening  02/14/2019  . Pneumonia vaccines (2 of 2 - PPSV23) 04/11/2019  . Flu Shot  10/27/2019  . Mammogram  09/05/2020  . Tetanus Vaccine  09/10/2021  . DEXA scan (bone density measurement)  Completed    Colonoscopy, Adult A colonoscopy is a procedure to look at the entire large intestine. This procedure is done using a long, thin, flexible tube that has a camera on the end. You may have a colonoscopy:  As a part of normal colorectal screening.  If you have certain symptoms, such as: ? A low number of red blood cells in your blood (anemia). ? Diarrhea that does not go away. ? Pain in your abdomen. ? Blood in your stool. A colonoscopy can help screen for and diagnose medical problems, including:  Tumors.  Extra tissue that grows where mucus forms (polyps).  Inflammation.  Areas of bleeding. Tell your health care provider about:  Any allergies you have.  All medicines you are taking, including vitamins, herbs, eye drops, creams, and over-the-counter medicines.  Any problems you or family members have had with anesthetic medicines.  Any blood disorders you have.  Any surgeries you have had.  Any medical conditions you have.  Any problems you have had with having bowel movements.  Whether you are pregnant or may be pregnant. What are the  risks? Generally, this is a safe procedure. However, problems may occur, including:  Bleeding.  Damage to your intestine.  Allergic reactions to medicines given during the procedure.  Infection. This is rare. What happens before the procedure? Eating and drinking restrictions Follow instructions from your health care provider about eating or drinking restrictions, which may include:  A few days before the procedure: ? Follow a low-fiber diet. ? Avoid nuts, seeds, dried fruit, raw fruits, and vegetables.  1-3 days before the procedure: ? Eat only gelatin dessert or ice pops. ? Drink only clear liquids, such as water, clear juice, clear broth or bouillon, black coffee or tea, or clear soft drinks or sports drinks. ? Avoid liquids that contain red or purple dye.  The day of the procedure: ? Do not eat solid foods. You may continue to drink clear liquids until up to 2 hours before the procedure. ? Do not eat or drink anything starting 2 hours before the procedure, or within the time period that your health care provider recommends. Bowel prep If you were prescribed a bowel prep to take by mouth (orally) to clean out your colon:  Take it as told by your health care provider. Starting the day before your procedure, you will need to drink a large amount of liquid medicine. The liquid will cause you to have many bowel movements of loose stool until your stool becomes  almost clear or light green.  If your skin or the opening between the buttocks (anus) gets irritated from diarrhea, you may relieve the irritation using: ? Wipes with medicine in them, such as adult wet wipes with aloe and vitamin E. ? A product to soothe skin, such as petroleum jelly.  If you vomit while drinking the bowel prep: ? Take a break for up to 60 minutes. ? Begin the bowel prep again. ? Call your health care provider if you keep vomiting or you cannot take the bowel prep without vomiting.  To clean out your  colon, you may also be given: ? Laxative medicines. These help you have a bowel movement. ? Instructions for enema use. An enema is liquid medicine injected into your rectum. Medicines Ask your health care provider about:  Changing or stopping your regular medicines or supplements. This is especially important if you are taking iron supplements, diabetes medicines, or blood thinners.  Taking medicines such as aspirin and ibuprofen. These medicines can thin your blood. Do not take these medicines unless your health care provider tells you to take them.  Taking over-the-counter medicines, vitamins, herbs, and supplements. General instructions  Ask your health care provider what steps will be taken to help prevent infection. These may include washing skin with a germ-killing soap.  Plan to have someone take you home from the hospital or clinic. What happens during the procedure?   An IV will be inserted into one of your veins.  You may be given one or more of the following: ? A medicine to help you relax (sedative). ? A medicine to numb the area (local anesthetic). ? A medicine to make you fall asleep (general anesthetic). This is rarely needed.  You will lie on your side with your knees bent.  The tube will: ? Have oil or gel put on it (be lubricated). ? Be inserted into your anus. ? Be gently eased through all parts of your large intestine.  Air will be sent into your colon to keep it open. This may cause some pressure or cramping.  Images will be taken with the camera and will appear on a screen.  A small tissue sample may be removed to be looked at under a microscope (biopsy). The tissue may be sent to a lab for testing if any signs of problems are found.  If small polyps are found, they may be removed and checked for cancer cells.  When the procedure is finished, the tube will be removed. The procedure may vary among health care providers and hospitals. What happens after  the procedure?  Your blood pressure, heart rate, breathing rate, and blood oxygen level will be monitored until you leave the hospital or clinic.  You may have a small amount of blood in your stool.  You may pass gas and have mild cramping or bloating in your abdomen. This is caused by the air that was used to open your colon during the exam.  Do not drive for 24 hours after the procedure.  It is up to you to get the results of your procedure. Ask your health care provider, or the department that is doing the procedure, when your results will be ready. Summary  A colonoscopy is a procedure to look at the entire large intestine.  Follow instructions from your health care provider about eating and drinking before the procedure.  If you were prescribed an oral bowel prep to clean out your colon, take it as told  by your health care provider.  During the colonoscopy, a flexible tube with a camera on its end is inserted into the anus and then passed into the other parts of the large intestine. This information is not intended to replace advice given to you by your health care provider. Make sure you discuss any questions you have with your health care provider. Document Revised: 10/05/2018 Document Reviewed: 10/05/2018 Elsevier Patient Education  [place] A mammogram is a low energy X-ray of the breasts that is done to check for abnormal changes. This procedure can screen for and detect any changes that may indicate breast cancer. Mammograms are regularly done on women. A man may have a mammogram if he has a lump or swelling in his breast. A mammogram can also identify other changes and variations in the breast, such as:  Inflammation of the breast tissue (mastitis).  An infected area that contains a collection of pus (abscess).  A fluid-filled sac (cyst).  Fibrocystic changes. This is when breast tissue becomes denser, which can make the tissue feel rope-like or  uneven under the skin.  Tumors that are not cancerous (benign). Tell a health care provider:  About any allergies you have.  If you have breast implants.  If you have had previous breast disease, biopsy, or surgery.  If you are breastfeeding.  If you are younger than age 72.  If you have a family history of breast cancer.  Whether you are pregnant or may be pregnant. What are the risks? Generally, this is a safe procedure. However, problems may occur, including:  Exposure to radiation. Radiation levels are very low with this test.  The results being misinterpreted.  The need for further tests.  The inability of the mammogram to detect certain cancers. What happens before the procedure?  Schedule your test about 1-2 weeks after your menstrual period if you are still menstruating. This is usually when your breasts are the least tender.  If you have had a mammogram done at a different facility in the past, get the mammogram X-rays or have them sent to your current exam facility. The new and old images will be compared.  Wash your breasts and underarms on the day of the test.  Do not wear deodorants, perfumes, lotions, or powders anywhere on your body on the day of the test.  Remove any jewelry from your neck.  Wear clothes that you can change into and out of easily. What happens during the procedure?   You will undress from the waist up and put on a gown that opens in the front.  You will stand in front of the X-ray machine.  Each breast will be placed between two plastic or glass plates. The plates will compress your breast for a few seconds. Try to stay as relaxed as possible during the procedure. This does not cause any harm to your breasts and any discomfort you feel will be very brief.  X-rays will be taken from different angles of each breast. The procedure may vary among health care providers and hospitals. What happens after the procedure?  The mammogram  will be examined by a specialist (radiologist).  You may need to repeat certain parts of the test, depending on the quality of the images. This is commonly done if the radiologist needs a better view of the breast tissue.  You may resume your normal activities.  It is up to you to get the results of your procedure. Ask  your health care provider, or the department that is doing the procedure, when your results will be ready. Summary  A mammogram is a low energy X-ray of the breasts that is done to check for abnormal changes. A man may have a mammogram if he has a lump or swelling in his breast.  If you have had a mammogram done at a different facility in the past, get the mammogram X-rays or have them sent to your current exam facility in order to compare them.  Schedule your test about 1-2 weeks after your menstrual period if you are still menstruating.  For this test, each breast will be placed between two plastic or glass plates. The plates will compress your breast for a few seconds.  Ask when your test results will be ready. Make sure you get your test results. This information is not intended to replace advice given to you by your health care provider. Make sure you discuss any questions you have with your health care provider. Document Revised: 11/02/2017 Document Reviewed: 11/02/2017 Elsevier Patient Education  2020 [employer].

## 2019-07-04 ENCOUNTER — Encounter: Payer: Self-pay | Admitting: Internal Medicine

## 2019-07-04 ENCOUNTER — Ambulatory Visit: Payer: Medicare PPO | Admitting: Internal Medicine

## 2019-07-04 ENCOUNTER — Other Ambulatory Visit: Payer: Self-pay

## 2019-07-04 VITALS — BP 122/80 | HR 83 | Temp 97.1°F | Resp 16 | Ht 59.0 in | Wt 139.0 lb

## 2019-07-04 DIAGNOSIS — K219 Gastro-esophageal reflux disease without esophagitis: Secondary | ICD-10-CM

## 2019-07-04 DIAGNOSIS — M25551 Pain in right hip: Secondary | ICD-10-CM

## 2019-07-04 DIAGNOSIS — M25511 Pain in right shoulder: Secondary | ICD-10-CM

## 2019-07-04 NOTE — Progress Notes (Signed)
Patient ID: Jessica Peck, female   DOB: 12/29/1950, 69 y.o.   MRN: [id number]   Subjective:    Patient ID: Jessica Peck, female    DOB: 1950/11/20, 69 y.o.   MRN: [id number]  HPI This visit occurred during the SARS-CoV-2 public health emergency.  Safety protocols were in place, including screening questions prior to the visit, additional usage of staff PPE, and extensive cleaning of exam room while observing appropriate contact time as indicated for disinfecting solutions.  Patient here as a work in appt with concerns regarding persistent right shoulder and right hip pain.  Pain in right buttock - down to knee.  Right shoulder - pain  - bone on bone.  Taking 1/2 meloxicam.  Given persistent pain, request referral to ortho.  No chest pain or sob reported.  Taking PPI.  GI symptoms appear to be controlled.  Off cholesterol medication.  Appears to be doing well on sertraline.    Past Medical History:  Diagnosis Date  . Chronic constipation   . Esophagitis   . Gastritis    EGD (hiatal hernia)  . GERD (gastroesophageal reflux disease)   . Hypercholesterolemia   . Sleep apnea    CPAP  . Vitamin D deficiency    Past Surgical History:  Procedure Laterality Date  . APPENDECTOMY  1976   Family History  Problem Relation Age of Onset  . Colon cancer Father   . Colon cancer Mother   . COPD Mother   . Heart disease Mother   . Breast cancer Sister        died age 87  . Breast cancer Maternal Uncle 60  . Breast cancer Paternal Aunt 28   Social History   Socioeconomic History  . Marital status: Married    Spouse name: Not on file  . Number of children: 2  . Years of education: Not on file  . Highest education level: Not on file  Occupational History  . Not on file  Tobacco Use  . Smoking status: Never Smoker  . Smokeless tobacco: Never Used  Substance and Sexual Activity  . Alcohol use: Yes    Alcohol/week: 0.0 standard drinks    Comment: occasional  . Drug use: No  .  Sexual activity: Not on file  Other Topics Concern  . Not on file  Social History Narrative  . Not on file   Social Determinants of Health   Financial Resource Strain:   . Difficulty of Paying Living Expenses:   Food Insecurity:   . Worried About Programme researcher, broadcasting/film/video in the Last Year:   . Barista in the Last Year:   Transportation Needs:   . Freight forwarder (Medical):   Marland Kitchen Lack of Transportation (Non-Medical):   Physical Activity:   . Days of Exercise per Week:   . Minutes of Exercise per Session:   Stress:   . Feeling of Stress :   Social Connections:   . Frequency of Communication with Friends and Family:   . Frequency of Social Gatherings with Friends and Family:   . Attends Religious Services:   . Active Member of Clubs or Organizations:   . Attends Banker Meetings:   Marland Kitchen Marital Status:     Outpatient Encounter Medications as of 07/04/2019  Medication Sig  . conjugated estrogens (PREMARIN) vaginal cream Place vaginally as needed.  . Cyanocobalamin (RA VITAMIN B-12 TR) 1000 MCG TBCR Take by mouth.  . Melatonin 3 MG TABS  Take 0.5 tablets by mouth at bedtime.  . pantoprazole (PROTONIX) 20 MG tablet ONE TO TWO TABLETS EVERY DAY  . sertraline (ZOLOFT) 50 MG tablet TAKE 1 AND 1/2 TABLET PER DAY  . Turmeric 500 MG CAPS Take by mouth daily.  Marland Kitchen VITAMIN D, CHOLECALCIFEROL, PO Take 4,000 Units by mouth daily.    No facility-administered encounter medications on file as of 07/04/2019.    Review of Systems  Constitutional: Negative for appetite change and unexpected weight change.  HENT: Negative for congestion and sinus pressure.   Respiratory: Negative for cough, chest tightness and shortness of breath.   Cardiovascular: Negative for chest pain, palpitations and leg swelling.  Gastrointestinal: Negative for abdominal pain, diarrhea, nausea and vomiting.  Genitourinary: Negative for difficulty urinating and dysuria.  Musculoskeletal:       Right  shoulder and right hip/buttock pain as outlined.    Skin: Negative for color change and rash.  Neurological: Negative for dizziness, light-headedness and headaches.  Psychiatric/Behavioral: Negative for agitation and dysphoric mood.       Objective:    Physical Exam Vitals reviewed.  Constitutional:      General: She is not in acute distress.    Appearance: Normal appearance.  HENT:     Head: Normocephalic and atraumatic.     Right Ear: External ear normal.     Left Ear: External ear normal.  Eyes:     General: No scleral icterus.       Right eye: No discharge.        Left eye: No discharge.     Conjunctiva/sclera: Conjunctivae normal.  Neck:     Thyroid: No thyromegaly.  Cardiovascular:     Rate and Rhythm: Normal rate and regular rhythm.  Pulmonary:     Effort: No respiratory distress.     Breath sounds: Normal breath sounds. No wheezing.  Abdominal:     General: Bowel sounds are normal.     Palpations: Abdomen is soft.     Tenderness: There is no abdominal tenderness.  Musculoskeletal:        General: No swelling or tenderness.     Cervical back: Neck supple. No tenderness.  Lymphadenopathy:     Cervical: No cervical adenopathy.  Skin:    Findings: No erythema or rash.  Neurological:     Mental Status: She is alert.  Psychiatric:        Mood and Affect: Mood normal.        Behavior: Behavior normal.     BP 122/80   Pulse 83   Temp (!) 97.1 F (36.2 C)   Resp 16   Ht 4\' 11"  (1.499 m)   Wt 139 lb (63 kg)   LMP 06/04/2000   SpO2 98%   BMI 28.07 kg/m  Wt Readings from Last 3 Encounters:  07/04/19 139 lb (63 kg)  07/01/19 135 lb (61.2 kg)  06/17/19 135 lb 14.4 oz (61.6 kg)     Lab Results  Component Value Date   WBC 4.7 10/05/2018   HGB 13.3 10/05/2018   HCT 39.9 10/05/2018   PLT 254.0 10/05/2018   GLUCOSE 109 (H) 01/11/2019   CHOL 252 (H) 01/11/2019   TRIG 220.0 (H) 01/11/2019   HDL 51.20 01/11/2019   LDLDIRECT 155.0 01/11/2019   LDLCALC  158 (H) 09/05/2018   ALT 24 01/11/2019   AST 19 01/11/2019   NA 140 01/11/2019   K 4.5 01/11/2019   CL 105 01/11/2019   CREATININE 0.81 01/11/2019  BUN 21 01/11/2019   CO2 26 01/11/2019   TSH 4.31 09/05/2018   HGBA1C 6.1 01/11/2019       Assessment & Plan:   Problem List Items Addressed This Visit    GERD (gastroesophageal reflux disease)    Appears to be controlled on protonix.       Right hip pain - Primary    Persistent right hip pain.  Refer to ortho for further evaluation and treatment.       Relevant Orders   Ambulatory referral to Orthopedic Surgery   Shoulder pain    Persistent right shoulder pain.  Refer to ortho for further evaluation and treatment.       Relevant Orders   Ambulatory referral to Orthopedic Surgery       Einar Pheasant, MD

## 2019-07-05 ENCOUNTER — Ambulatory Visit: Payer: Medicare PPO | Admitting: Internal Medicine

## 2019-07-11 ENCOUNTER — Other Ambulatory Visit: Payer: Self-pay | Admitting: Internal Medicine

## 2019-07-11 DIAGNOSIS — Z1211 Encounter for screening for malignant neoplasm of colon: Secondary | ICD-10-CM

## 2019-07-11 NOTE — Progress Notes (Signed)
Order placed for GI referral.   

## 2019-07-13 ENCOUNTER — Encounter: Payer: Self-pay | Admitting: Internal Medicine

## 2019-07-13 NOTE — Assessment & Plan Note (Signed)
Persistent right hip pain.  Refer to ortho for further evaluation and treatment.

## 2019-07-13 NOTE — Assessment & Plan Note (Signed)
Appears to be controlled on protonix.  

## 2019-07-13 NOTE — Assessment & Plan Note (Signed)
Persistent right shoulder pain.  Refer to ortho for further evaluation and treatment.

## 2019-07-25 DIAGNOSIS — M25551 Pain in right hip: Secondary | ICD-10-CM | POA: Diagnosis not present

## 2019-07-25 DIAGNOSIS — M1611 Unilateral primary osteoarthritis, right hip: Secondary | ICD-10-CM | POA: Diagnosis not present

## 2019-07-25 DIAGNOSIS — M19011 Primary osteoarthritis, right shoulder: Secondary | ICD-10-CM | POA: Diagnosis not present

## 2019-09-02 ENCOUNTER — Telehealth: Payer: Self-pay | Admitting: Internal Medicine

## 2019-09-02 DIAGNOSIS — Z1231 Encounter for screening mammogram for malignant neoplasm of breast: Secondary | ICD-10-CM

## 2019-09-02 NOTE — Telephone Encounter (Signed)
Order placed for mammogram.

## 2019-09-02 NOTE — Telephone Encounter (Signed)
-----   Message from Karlyne Greenspan sent at 08/29/2019  8:37 AM EDT ----- Regarding: mammo order Good morning!  Mammo order needs to be corrected to IMG [id number]. Please advise and Thank you!

## 2019-09-23 ENCOUNTER — Other Ambulatory Visit: Payer: Self-pay

## 2019-09-23 ENCOUNTER — Other Ambulatory Visit (INDEPENDENT_AMBULATORY_CARE_PROVIDER_SITE_OTHER): Payer: Medicare PPO

## 2019-09-23 DIAGNOSIS — R739 Hyperglycemia, unspecified: Secondary | ICD-10-CM | POA: Diagnosis not present

## 2019-09-23 DIAGNOSIS — E7800 Pure hypercholesterolemia, unspecified: Secondary | ICD-10-CM

## 2019-09-23 LAB — HEPATIC FUNCTION PANEL
ALT: 17 U/L (ref 0–35)
AST: 16 U/L (ref 0–37)
Albumin: 4.6 g/dL (ref 3.5–5.2)
Alkaline Phosphatase: 47 U/L (ref 39–117)
Bilirubin, Direct: 0 mg/dL (ref 0.0–0.3)
Total Bilirubin: 0.4 mg/dL (ref 0.2–1.2)
Total Protein: 7.1 g/dL (ref 6.0–8.3)

## 2019-09-23 LAB — BASIC METABOLIC PANEL
BUN: 14 mg/dL (ref 6–23)
CO2: 24 mEq/L (ref 19–32)
Calcium: 10 mg/dL (ref 8.4–10.5)
Chloride: 106 mEq/L (ref 96–112)
Creatinine, Ser: 0.75 mg/dL (ref 0.40–1.20)
GFR: 76.93 mL/min (ref >60.00)
Glucose, Bld: 101 mg/dL — ABNORMAL HIGH (ref 70–99)
Potassium: 4.3 mEq/L (ref 3.5–5.1)
Sodium: 139 mEq/L (ref 135–145)

## 2019-09-23 LAB — CBC WITH DIFFERENTIAL/PLATELET
Basophils Absolute: 0 10*3/uL (ref 0.0–0.1)
Basophils Relative: 0.9 % (ref 0.0–3.0)
Eosinophils Absolute: 0.2 10*3/uL (ref 0.0–0.7)
Eosinophils Relative: 4.7 % (ref 0.0–5.0)
HCT: 39.5 % (ref 36.0–46.0)
Hemoglobin: 13.4 g/dL (ref 12.0–15.0)
Lymphocytes Relative: 53.5 % — ABNORMAL HIGH (ref 12.0–46.0)
Lymphs Abs: 2.8 10*3/uL (ref 0.7–4.0)
MCHC: 33.8 g/dL (ref 30.0–36.0)
MCV: 90.2 fl (ref 78.0–100.0)
Monocytes Absolute: 0.3 10*3/uL (ref 0.1–1.0)
Monocytes Relative: 6.2 % (ref 3.0–12.0)
Neutro Abs: 1.8 10*3/uL (ref 1.4–7.7)
Neutrophils Relative %: 34.7 % — ABNORMAL LOW (ref 43.0–77.0)
Platelets: 266 10*3/uL (ref 150.0–400.0)
RBC: 4.38 Mil/uL (ref 3.87–5.11)
RDW: 13.8 % (ref 11.5–15.5)
WBC: 5.3 10*3/uL (ref 4.0–10.5)

## 2019-09-23 LAB — HEMOGLOBIN A1C: Hgb A1c MFr Bld: 5.9 % (ref 4.6–6.5)

## 2019-09-23 LAB — LIPID PANEL
Cholesterol: 244 mg/dL — ABNORMAL HIGH (ref 0–200)
HDL: 54 mg/dL (ref >39.00)
NonHDL: 190.01
Total CHOL/HDL Ratio: 5
Triglycerides: 218 mg/dL — ABNORMAL HIGH (ref 0.0–149.0)
VLDL: 43.6 mg/dL — ABNORMAL HIGH (ref 0.0–40.0)

## 2019-09-23 LAB — LDL CHOLESTEROL, DIRECT: Direct LDL: 146 mg/dL

## 2019-09-23 LAB — TSH: TSH: 5.14 u[IU]/mL — ABNORMAL HIGH (ref 0.35–4.50)

## 2019-09-25 ENCOUNTER — Ambulatory Visit: Payer: Medicare PPO | Admitting: Internal Medicine

## 2019-09-25 ENCOUNTER — Other Ambulatory Visit: Payer: Self-pay

## 2019-09-25 VITALS — BP 124/68 | HR 83 | Temp 97.9°F | Resp 16 | Ht 59.0 in | Wt 139.0 lb

## 2019-09-25 DIAGNOSIS — R5383 Other fatigue: Secondary | ICD-10-CM

## 2019-09-25 DIAGNOSIS — R739 Hyperglycemia, unspecified: Secondary | ICD-10-CM | POA: Diagnosis not present

## 2019-09-25 DIAGNOSIS — G4733 Obstructive sleep apnea (adult) (pediatric): Secondary | ICD-10-CM

## 2019-09-25 DIAGNOSIS — Z86010 Personal history of colonic polyps: Secondary | ICD-10-CM

## 2019-09-25 DIAGNOSIS — F439 Reaction to severe stress, unspecified: Secondary | ICD-10-CM

## 2019-09-25 DIAGNOSIS — Z1159 Encounter for screening for other viral diseases: Secondary | ICD-10-CM

## 2019-09-25 DIAGNOSIS — E7800 Pure hypercholesterolemia, unspecified: Secondary | ICD-10-CM | POA: Diagnosis not present

## 2019-09-25 DIAGNOSIS — K219 Gastro-esophageal reflux disease without esophagitis: Secondary | ICD-10-CM | POA: Diagnosis not present

## 2019-09-25 MED ORDER — PANTOPRAZOLE SODIUM 20 MG PO TBEC: DELAYED_RELEASE_TABLET | ORAL | 1 refills | Status: DC

## 2019-09-25 NOTE — Progress Notes (Signed)
Patient ID: Jessica Peck, female   DOB: June 09, 1950, 69 y.o.   MRN: [id number]   Subjective:    Patient ID: Jessica Peck, female    DOB: 1951/03/18, 69 y.o.   MRN: [id number]  HPI This visit occurred during the SARS-CoV-2 public health emergency.  Safety protocols were in place, including screening questions prior to the visit, additional usage of staff PPE, and extensive cleaning of exam room while observing appropriate contact time as indicated for disinfecting solutions.  Patient here for a scheduled follow up. She reports she is doing relatively well.  Reports that the site where she got the pneumonia vaccine - was red and irritated.  Resolved now.  States was at [employer].  After this, started feeling fatigued.  Had nausea, vomiting and diarrhea for approximately 24 hours.  Feels may have "picked up something there" (around 09/08/19 - 09/10/19).  Eating now.  No nausea or vomiting now.  Has had covid vaccines.  Reports increased fatigue.  Increased snoring.  Wakes up tired.  No chest pain.  Breathing stable.  protonix 37m - controls symptoms.  Has family history of breast cancer - maternal uncle, paternal aunt and sister - age 134  No ovarian cancer.  Discussed genetic counseling/testing.  She had questions about breat MRI vs mammogram.  No abdominal pain.  Bowels stable. Discussed diet and exercise.    Past Medical History:  Diagnosis Date  . Chronic constipation   . Esophagitis   . Gastritis    EGD (hiatal hernia)  . GERD (gastroesophageal reflux disease)   . Hypercholesterolemia   . Sleep apnea    CPAP  . Vitamin D deficiency    Past Surgical History:  Procedure Laterality Date  . APPENDECTOMY  1976   Family History  Problem Relation Age of Onset  . Colon cancer Father   . Colon cancer Mother   . COPD Mother   . Heart disease Mother   . Breast cancer Sister        died age 140 . Breast cancer Maternal Uncle 60  . Breast cancer Paternal AAunt 44  Social  History   Socioeconomic History  . Marital status: Married    Spouse name: Not on file  . Number of children: 2  . Years of education: Not on file  . Highest education level: Not on file  Occupational History  . Not on file  Tobacco Use  . Smoking status: Never Smoker  . Smokeless tobacco: Never Used  Substance and Sexual Activity  . Alcohol use: Yes    Alcohol/week: 0.0 standard drinks    Comment: occasional  . Drug use: No  . Sexual activity: Not on file  Other Topics Concern  . Not on file  Social History Narrative  . Not on file   Social Determinants of Health   Financial Resource Strain:   . Difficulty of Paying Living Expenses:   Food Insecurity:   . Worried About RCharity fundraiserin the Last Year:   . RArboriculturistin the Last Year:   Transportation Needs:   . LFilm/video editor(Medical):   .Marland KitchenLack of Transportation (Non-Medical):   Physical Activity:   . Days of Exercise per Week:   . Minutes of Exercise per Session:   Stress:   . Feeling of Stress :   Social Connections:   . Frequency of Communication with Friends and Family:   . Frequency of Social Gatherings with  Friends and Family:   . Attends Religious Services:   . Active Member of Clubs or Organizations:   . Attends Archivist Meetings:   Marland Kitchen Marital Status:     Outpatient Encounter Medications as of 09/25/2019  Medication Sig  . conjugated estrogens (PREMARIN) vaginal cream Place vaginally as needed.  . Cyanocobalamin (RA VITAMIN B-12 TR) 1000 MCG TBCR Take by mouth.  . Melatonin 3 MG TABS Take 0.5 tablets by mouth at bedtime.  . meloxicam (MOBIC) 7.5 MG tablet Take 7.5 mg by mouth daily.  . pantoprazole (PROTONIX) 20 MG tablet ONE TO TWO TABLETS EVERY DAY  . sertraline (ZOLOFT) 50 MG tablet TAKE 1 AND 1/2 TABLET PER DAY  . Turmeric 500 MG CAPS Take by mouth daily.  Marland Kitchen VITAMIN D, CHOLECALCIFEROL, PO Take 4,000 Units by mouth daily.   . [DISCONTINUED] pantoprazole (PROTONIX) 20  MG tablet ONE TO TWO TABLETS EVERY DAY   No facility-administered encounter medications on file as of 09/25/2019.    Review of Systems  Constitutional: Positive for fatigue. Negative for appetite change and unexpected weight change.  HENT: Negative for congestion and sinus pressure.   Respiratory: Negative for cough, chest tightness and shortness of breath.   Cardiovascular: Negative for chest pain, palpitations and leg swelling.  Gastrointestinal: Negative for abdominal pain, diarrhea, nausea and vomiting.  Genitourinary: Negative for difficulty urinating and dysuria.  Musculoskeletal: Negative for joint swelling and myalgias.  Skin: Negative for color change and rash.  Neurological: Negative for dizziness, light-headedness and headaches.  Psychiatric/Behavioral: Negative for agitation and dysphoric mood.       Objective:    Physical Exam Vitals reviewed.  Constitutional:      General: She is not in acute distress.    Appearance: Normal appearance.  HENT:     Head: Normocephalic and atraumatic.     Right Ear: External ear normal.     Left Ear: External ear normal.  Eyes:     General: No scleral icterus.       Right eye: No discharge.        Left eye: No discharge.     Conjunctiva/sclera: Conjunctivae normal.  Neck:     Thyroid: No thyromegaly.  Cardiovascular:     Rate and Rhythm: Normal rate and regular rhythm.  Pulmonary:     Effort: No respiratory distress.     Breath sounds: Normal breath sounds. No wheezing.  Abdominal:     General: Bowel sounds are normal.     Palpations: Abdomen is soft.     Tenderness: There is no abdominal tenderness.  Musculoskeletal:        General: No swelling or tenderness.     Cervical back: Neck supple. No tenderness.  Lymphadenopathy:     Cervical: No cervical adenopathy.  Skin:    Findings: No erythema or rash.  Neurological:     Mental Status: She is alert.  Psychiatric:        Mood and Affect: Mood normal.        Behavior:  Behavior normal.     BP 124/68   Pulse 83   Temp 97.9 F (36.6 C)   Resp 16   Ht _0  (1.499 m)   Wt 139 lb (63 kg)   LMP 06/04/2000   SpO2 98%   BMI 28.07 kg/m  Wt Readings from Last 3 Encounters:  09/25/19 139 lb (63 kg)  07/04/19 139 lb (63 kg)  07/01/19 135 lb (61.2 kg)  Lab Results  Component Value Date   WBC 5.3 09/23/2019   HGB 13.4 09/23/2019   HCT 39.5 09/23/2019   PLT 266.0 09/23/2019   GLUCOSE 101 (H) 09/23/2019   CHOL 244 (H) 09/23/2019   TRIG 218.0 (H) 09/23/2019   HDL 54.00 09/23/2019   LDLDIRECT 146.0 09/23/2019   LDLCALC 158 (H) 09/05/2018   ALT 17 09/23/2019   AST 16 09/23/2019   NA 139 09/23/2019   K 4.3 09/23/2019   CL 106 09/23/2019   CREATININE 0.75 09/23/2019   BUN 14 09/23/2019   CO2 24 09/23/2019   TSH 5.14 (H) 09/23/2019   HGBA1C 5.9 09/23/2019       Assessment & Plan:   Problem List Items Addressed This Visit    Fatigue    Reports fatigue.  Refer to pulmonary for evaluation of question of sleep apnea.        Relevant Orders   Ambulatory referral to Pulmonology   Vitamin B12   GERD (gastroesophageal reflux disease)    Doing well on protonix 67m q day.  Follow.        Relevant Medications   pantoprazole (PROTONIX) 20 MG tablet   History of colonic polyps    Colonoscopy 01/2015.  Recommended f/u in 5 years.       Hyperglycemia    Low carb diet and exercise.  Follow met b and a1c.        Relevant Orders   Hemoglobin AM3N  Basic metabolic panel   Obstructive sleep apnea    Has a history of documented sleep apnea.  Reports increased fatigue.  Increased snoring.  Not using cpap.  Has been years since tested.  Refer to pulmonary for further evaluation and question of further w/up for sleep apnea.        Relevant Orders   Ambulatory referral to Pulmonology   Pure hypercholesterolemia    The 10-year ASCVD risk score (Mikey BussingDC JBrooke Bonito, et al., 2013) is: 7.1%   Values used to calculate the score:     Age: [age]years      Sex: Female     Is Non-Hispanic African American: No     Diabetic: No     Tobacco smoker: No     Systolic Blood Pressure: 1361mmHg     Is BP treated: No     HDL Cholesterol: 54 mg/dL     Total Cholesterol: 244 mg/dL  Discussed calculated cholesterol risk.  Low cholesterol diet and exercise.  Follow lipid panel.        Relevant Orders   CBC with Differential/Platelet   TSH   Hepatic function panel   Lipid panel   Stress    On zoloft.  Appears to be handling things relatively well.  Follow.         Other Visit Diagnoses    Need for hepatitis C screening test    -  Primary   Relevant Orders   Hepatitis C antibody       CEinar Pheasant MD

## 2019-09-25 NOTE — Assessment & Plan Note (Addendum)
The 10-year ASCVD risk score Denman George DC Montez Hageman., et al., 2013) is: 7.1%   Values used to calculate the score:     Age: 69 years     Sex: Female     Is Non-Hispanic African American: No     Diabetic: No     Tobacco smoker: No     Systolic Blood Pressure: 124 mmHg     Is BP treated: No     HDL Cholesterol: 54 mg/dL     Total Cholesterol: 244 mg/dL  Discussed calculated cholesterol risk.  Low cholesterol diet and exercise.  Follow lipid panel.

## 2019-09-30 ENCOUNTER — Encounter: Payer: Self-pay | Admitting: Internal Medicine

## 2019-09-30 NOTE — Assessment & Plan Note (Signed)
Low carb diet and exercise.  Follow met b and a1c.   

## 2019-09-30 NOTE — Assessment & Plan Note (Signed)
On zoloft.  Appears to be handling things relatively well.  Follow.   

## 2019-09-30 NOTE — Assessment & Plan Note (Signed)
Has a history of documented sleep apnea.  Reports increased fatigue.  Increased snoring.  Not using cpap.  Has been years since tested.  Refer to pulmonary for further evaluation and question of further w/up for sleep apnea.

## 2019-09-30 NOTE — Assessment & Plan Note (Signed)
Colonoscopy 01/2015.  Recommended f/u in 5 years.

## 2019-09-30 NOTE — Assessment & Plan Note (Signed)
Reports fatigue.  Refer to pulmonary for evaluation of question of sleep apnea.

## 2019-09-30 NOTE — Assessment & Plan Note (Signed)
Doing well on protonix 20mg  q day.  Follow.

## 2019-10-05 ENCOUNTER — Other Ambulatory Visit: Payer: Self-pay | Admitting: Internal Medicine

## 2019-10-09 DIAGNOSIS — K219 Gastro-esophageal reflux disease without esophagitis: Secondary | ICD-10-CM | POA: Diagnosis not present

## 2019-10-09 DIAGNOSIS — Z01812 Encounter for preprocedural laboratory examination: Secondary | ICD-10-CM | POA: Diagnosis not present

## 2019-10-09 DIAGNOSIS — Z800 Family history of malignant neoplasm of digestive organs: Secondary | ICD-10-CM | POA: Diagnosis not present

## 2019-10-09 DIAGNOSIS — G4733 Obstructive sleep apnea (adult) (pediatric): Secondary | ICD-10-CM | POA: Diagnosis not present

## 2019-10-25 DIAGNOSIS — L538 Other specified erythematous conditions: Secondary | ICD-10-CM | POA: Diagnosis not present

## 2019-10-25 DIAGNOSIS — L298 Other pruritus: Secondary | ICD-10-CM | POA: Diagnosis not present

## 2019-10-25 DIAGNOSIS — D2262 Melanocytic nevi of left upper limb, including shoulder: Secondary | ICD-10-CM | POA: Diagnosis not present

## 2019-10-25 DIAGNOSIS — D2271 Melanocytic nevi of right lower limb, including hip: Secondary | ICD-10-CM | POA: Diagnosis not present

## 2019-10-25 DIAGNOSIS — D2272 Melanocytic nevi of left lower limb, including hip: Secondary | ICD-10-CM | POA: Diagnosis not present

## 2019-10-25 DIAGNOSIS — D485 Neoplasm of uncertain behavior of skin: Secondary | ICD-10-CM | POA: Diagnosis not present

## 2019-10-25 DIAGNOSIS — D2261 Melanocytic nevi of right upper limb, including shoulder: Secondary | ICD-10-CM | POA: Diagnosis not present

## 2019-10-25 DIAGNOSIS — L570 Actinic keratosis: Secondary | ICD-10-CM | POA: Diagnosis not present

## 2019-10-25 DIAGNOSIS — L820 Inflamed seborrheic keratosis: Secondary | ICD-10-CM | POA: Diagnosis not present

## 2019-10-25 DIAGNOSIS — D225 Melanocytic nevi of trunk: Secondary | ICD-10-CM | POA: Diagnosis not present

## 2019-10-28 ENCOUNTER — Ambulatory Visit: Payer: Medicare PPO | Admitting: Primary Care

## 2019-10-28 ENCOUNTER — Encounter: Payer: Self-pay | Admitting: Primary Care

## 2019-10-28 ENCOUNTER — Other Ambulatory Visit: Payer: Self-pay

## 2019-10-28 VITALS — BP 124/76 | HR 79 | Temp 97.5°F | Ht 60.0 in | Wt 138.4 lb

## 2019-10-28 DIAGNOSIS — Z8669 Personal history of other diseases of the nervous system and sense organs: Secondary | ICD-10-CM | POA: Diagnosis not present

## 2019-10-28 DIAGNOSIS — G4733 Obstructive sleep apnea (adult) (pediatric): Secondary | ICD-10-CM | POA: Diagnosis not present

## 2019-10-28 NOTE — Patient Instructions (Addendum)
Pleasure meeting you today Jessica Peck  Orders: Home sleep study re: hx sleep apnea/ snoring   Recommendations: - Maintained healthy weight and stay active - Recommend taking melatonin 30-60min prior to bedtime, limit screen time 1 hour before bed, cool/dark environment - Do not take sedation medication or drink alcohol in excess prior to bedtime - Do not drive if experiencing excessive daytime fatigue or somnolence  Follow-up: - We will contact you after sleep study to review results and go over recommendations - Dr. Irene Limbo and Dr. Althea Grimmer (Dentist/orthodontist who specialize in oral appliance for sleep apnea)     Sleep Apnea Sleep apnea affects breathing during sleep. It causes breathing to stop for a short time or to become shallow. It can also increase the risk of:  Heart attack.  Stroke.  Being very overweight (obese).  Diabetes.  Heart failure.  Irregular heartbeat. The goal of treatment is to help you breathe normally again. What are the causes? There are three kinds of sleep apnea:  Obstructive sleep apnea. This is caused by a blocked or collapsed airway.  Central sleep apnea. This happens when the brain does not send the right signals to the muscles that control breathing.  Mixed sleep apnea. This is a combination of obstructive and central sleep apnea. The most common cause of this condition is a collapsed or blocked airway. This can happen if:  Your throat muscles are too relaxed.  Your tongue and tonsils are too large.  You are overweight.  Your airway is too small. What increases the risk?  Being overweight.  Smoking.  Having a small airway.  Being older.  Being female.  Drinking alcohol.  Taking medicines to calm yourself (sedatives or tranquilizers).  Having family members with the condition. What are the signs or symptoms?  Trouble staying asleep.  Being sleepy or tired during the day.  Getting angry a lot.  Loud  snoring.  Headaches in the morning.  Not being able to focus your mind (concentrate).  Forgetting things.  Less interest in sex.  Mood swings.  Personality changes.  Feelings of sadness (depression).  Waking up a lot during the night to pee (urinate).  Dry mouth.  Sore throat. How is this diagnosed?  Your medical history.  A physical exam.  A test that is done when you are sleeping (sleep study). The test is most often done in a sleep lab but may also be done at home. How is this treated?   Sleeping on your side.  Using a medicine to get rid of mucus in your nose (decongestant).  Avoiding the use of alcohol, medicines to help you relax, or certain pain medicines (narcotics).  Losing weight, if needed.  Changing your diet.  Not smoking.  Using a machine to open your airway while you sleep, such as: ? An oral appliance. This is a mouthpiece that shifts your lower jaw forward. ? A CPAP device. This device blows air through a mask when you breathe out (exhale). ? An EPAP device. This has valves that you put in each nostril. ? A BPAP device. This device blows air through a mask when you breathe in (inhale) and breathe out.  Having surgery if other treatments do not work. It is important to get treatment for sleep apnea. Without treatment, it can lead to:  High blood pressure.  Coronary artery disease.  In men, not being able to have an erection (impotence).  Reduced thinking ability. Follow these instructions at home: Lifestyle  Make changes that your doctor recommends.  Eat a healthy diet.  Lose weight if needed.  Avoid alcohol, medicines to help you relax, and some pain medicines.  Do not use any products that contain nicotine or tobacco, such as cigarettes, e-cigarettes, and chewing tobacco. If you need help quitting, ask your doctor. General instructions  Take over-the-counter and prescription medicines only as told by your doctor.  If you  were given a machine to use while you sleep, use it only as told by your doctor.  If you are having surgery, make sure to tell your doctor you have sleep apnea. You may need to bring your device with you.  Keep all follow-up visits as told by your doctor. This is important. Contact a doctor if:  The machine that you were given to use during sleep bothers you or does not seem to be working.  You do not get better.  You get worse. Get help right away if:  Your chest hurts.  You have trouble breathing in enough air.  You have an uncomfortable feeling in your back, arms, or stomach.  You have trouble talking.  One side of your body feels weak.  A part of your face is hanging down. These symptoms may be an emergency. Do not wait to see if the symptoms will go away. Get medical help right away. Call your local emergency services (911 in the [place] Do not drive yourself to the hospital. Summary  This condition affects breathing during sleep.  The most common cause is a collapsed or blocked airway.  The goal of treatment is to help you breathe normally while you sleep. This information is not intended to replace advice given to you by your health care provider. Make sure you discuss any questions you have with your health care provider. Document Revised: 12/29/2017 Document Reviewed: 11/07/2017 Elsevier Patient Education  2020 [employer].

## 2019-10-28 NOTE — Progress Notes (Signed)
@Patient  ID: , female    DOB: 1950-05-26, 69 y.o.   MRN: [id number]  Chief Complaint  Patient presents with  . sleep consult    per Dr. [id number]-- prior sleep study. unable to tolerate cpap. c/o daytime sleepiness and restless sleep x15y     Referring provider: Lorin Picket, MD  HPI: 69 year old female, never smoked. PMH significant for OSA, GERD, esophagitis, fatigue. Patient presents today for sleep consult referred by Dr. 79. She had a CPAP titration study in 2013 which showed mild OSA with AHI 12.4/hr. She did not tolerate CPAP, she used it for 1 month but could not get comfortable. States that her mind was not in the right place. Reports early morning snoring, daytime fatigue and restless sleep. She takes melatonin 3mg  at bedtime. She is not as active during the day, retired 4 years ago. She has put on approx 10 lbs since her last sleep study.   Sleep consult form Symptoms: Snoring, daytime fatigue, restless sleep Bedtime: 11-11:30pm Wake time: 9am Nocturnal awakenings: Once to use restroom Weight: 138lbs  Epworth: 6/24  Allergies  Allergen Reactions  . Penicillins Swelling    Immunization History  Administered Date(s) Administered  . Influenza Split 01/02/2013, 01/14/2014, 01/20/2016  . Influenza,inj,Quad PF,6+ Mos 12/26/2016  . Influenza-Unspecified 01/27/2018  . PFIZER SARS-COV-2 Vaccination 04/01/2019, 04/22/2019  . Pneumococcal Conjugate-13 04/10/2018  . Td 09/11/2011  . Zoster 01/20/2016  . Zoster Recombinat (Shingrix) 10/18/2018, 01/01/2019    Past Medical History:  Diagnosis Date  . Chronic constipation   . Esophagitis   . Gastritis    EGD (hiatal hernia)  . GERD (gastroesophageal reflux disease)   . Hypercholesterolemia   . Sleep apnea    CPAP  . Vitamin D deficiency     Tobacco History: Social History   Tobacco Use  Smoking Status Never Smoker  Smokeless Tobacco Never Used   Counseling given: Not Answered   Outpatient  Medications Prior to Visit  Medication Sig Dispense Refill  . conjugated estrogens (PREMARIN) vaginal cream Place vaginally as needed. 42.5 g 2  . Cyanocobalamin (RA VITAMIN B-12 TR) 1000 MCG TBCR Take by mouth.    . Melatonin 3 MG TABS Take 0.5 tablets by mouth at bedtime.    . meloxicam (MOBIC) 7.5 MG tablet Take 7.5 mg by mouth daily.    . pantoprazole (PROTONIX) 20 MG tablet ONE TO TWO TABLETS EVERY DAY 180 tablet 1  . sertraline (ZOLOFT) 50 MG tablet TAKE 1 AND 1/2 TABLET PER DAY 135 tablet 1  . Turmeric 500 MG CAPS Take by mouth daily.    10/20/2018 VITAMIN D, CHOLECALCIFEROL, PO Take 4,000 Units by mouth daily.      No facility-administered medications prior to visit.    Review of Systems  Review of Systems  Constitutional: Positive for fatigue.  Respiratory: Negative.   Psychiatric/Behavioral: Positive for sleep disturbance.    Physical Exam  BP 124/76 (BP Location: Left Arm, Cuff Size: Normal)   Pulse 79   Temp (!) 97.5 F (36.4 C) (Temporal)   Ht 5' (1.524 m)   Wt 138 lb 6.4 oz (62.8 kg)   LMP 06/04/2000   SpO2 97%   BMI 27.03 kg/m  Physical Exam Constitutional:      Appearance: Normal appearance.  HENT:     Mouth/Throat:     Mouth: Mucous membranes are moist.     Pharynx: Oropharynx is clear.     Comments: Mallampati class II (Smaller oral cavity) Cardiovascular:  Rate and Rhythm: Normal rate and regular rhythm.  Pulmonary:     Effort: Pulmonary effort is normal.     Breath sounds: Normal breath sounds.  Neurological:     General: No focal deficit present.     Mental Status: She is alert and oriented to person, place, and time. Mental status is at baseline.  Psychiatric:        Mood and Affect: Mood normal.        Behavior: Behavior normal.        Thought Content: Thought content normal.        Judgment: Judgment normal.      Lab Results:  CBC    Component Value Date/Time   WBC 5.3 09/23/2019 0839   RBC 4.38 09/23/2019 0839   HGB 13.4 09/23/2019  0839   HGB 14.2 05/14/2011 1741   HCT 39.5 09/23/2019 0839   HCT 42.2 05/14/2011 1741   PLT 266.0 09/23/2019 0839   PLT 235 05/14/2011 1741   MCV 90.2 09/23/2019 0839   MCV 90 05/14/2011 1741   MCH 30.0 05/14/2011 1741   MCH 29.2 08/10/2010 1455   MCHC 33.8 09/23/2019 0839   RDW 13.8 09/23/2019 0839   RDW 13.2 05/14/2011 1741   LYMPHSABS 2.8 09/23/2019 0839   LYMPHSABS 2.7 05/14/2011 1741   MONOABS 0.3 09/23/2019 0839   MONOABS 0.3 05/14/2011 1741   EOSABS 0.2 09/23/2019 0839   EOSABS 0.1 05/14/2011 1741   BASOSABS 0.0 09/23/2019 0839   BASOSABS 0.0 05/14/2011 1741    BMET    Component Value Date/Time   NA 139 09/23/2019 0839   NA 140 05/14/2011 1741   K 4.3 09/23/2019 0839   K 3.8 05/14/2011 1741   CL 106 09/23/2019 0839   CL 104 05/14/2011 1741   CO2 24 09/23/2019 0839   CO2 22 05/14/2011 1741   GLUCOSE 101 (H) 09/23/2019 0839   GLUCOSE 141 (H) 05/14/2011 1741   BUN 14 09/23/2019 0839   BUN 20 (H) 05/14/2011 1741   CREATININE 0.75 09/23/2019 0839   CREATININE 0.73 11/23/2011 0756   CALCIUM 10.0 09/23/2019 0839   CALCIUM 9.7 05/14/2011 1741   GFRNONAA >60 11/23/2011 0756   GFRAA >60 11/23/2011 0756    BNP No results found for: BNP  ProBNP No results found for: PROBNP  Imaging: No results found.   Assessment & Plan:   Obstructive sleep apnea - Hx mild obstructive sleep apnea, split night sleep study in 2013 showed AHI 12.4/hr. She did not tolerate CPAP  - Home sleep study re: hx sleep apnea/ snoring  - Recommend taking melatonin 3mg  30-76min prior to bedtime, encourage side sleeping position, limit screen time 1 hour before bed, sleep in cool/dark environment - Advised patient not drive if experiencing excessive daytime fatigue or somnolence - Patient is open to seeing Dr. 72m or Dr. Myrtis Ser for possible oral appliance if repeat HST showed OSA - Follow-up: We will contact you after sleep study to review results and go over  recommendations    Toni Arthurs, NP 10/28/2019

## 2019-10-28 NOTE — Assessment & Plan Note (Signed)
-   Hx mild obstructive sleep apnea, split night sleep study in 2013 showed AHI 12.4/hr. She did not tolerate CPAP  - Home sleep study re: hx sleep apnea/ snoring  - Recommend taking melatonin 3mg  30-40min prior to bedtime, encourage side sleeping position, limit screen time 1 hour before bed, sleep in cool/dark environment - Advised patient not drive if experiencing excessive daytime fatigue or somnolence - Patient is open to seeing Dr. 72m or Dr. Myrtis Ser for possible oral appliance if repeat HST showed OSA - Follow-up: We will contact you after sleep study to review results and go over recommendations

## 2019-11-04 NOTE — Progress Notes (Signed)
Reviewed and agree with assessment/plan.   Nawal Burling, MD [facility] 11/04/2019, 7:13 AM Pager:  [phone]  

## 2019-11-18 ENCOUNTER — Ambulatory Visit: Payer: Medicare PPO

## 2019-11-18 ENCOUNTER — Other Ambulatory Visit: Payer: Self-pay

## 2019-11-18 DIAGNOSIS — G4733 Obstructive sleep apnea (adult) (pediatric): Secondary | ICD-10-CM | POA: Diagnosis not present

## 2019-11-18 DIAGNOSIS — Z8669 Personal history of other diseases of the nervous system and sense organs: Secondary | ICD-10-CM

## 2019-11-21 ENCOUNTER — Other Ambulatory Visit: Payer: Self-pay

## 2019-11-21 ENCOUNTER — Ambulatory Visit
Admission: RE | Admit: 2019-11-21 | Discharge: 2019-11-21 | Disposition: A | Discharge status: Home / Self Care | Payer: Medicare PPO | Source: Ambulatory Visit | Attending: Internal Medicine | Admitting: Internal Medicine

## 2019-11-21 DIAGNOSIS — Z1231 Encounter for screening mammogram for malignant neoplasm of breast: Secondary | ICD-10-CM | POA: Diagnosis not present

## 2019-11-22 DIAGNOSIS — G4733 Obstructive sleep apnea (adult) (pediatric): Secondary | ICD-10-CM | POA: Diagnosis not present

## 2019-11-25 DIAGNOSIS — G4733 Obstructive sleep apnea (adult) (pediatric): Secondary | ICD-10-CM

## 2019-11-25 NOTE — Telephone Encounter (Signed)
Beth, pt asking about her HST results.  Have you reviewed yet? Please advise, thanks!

## 2019-11-25 NOTE — Telephone Encounter (Signed)
Please let patient know HST showed severe obstructive sleep apnea with SpO2 low 66%. Recommending CPAP therapy as she has severe apnea. She will need in-lab CPAP titration study.

## 2019-12-03 NOTE — Telephone Encounter (Signed)
Patient has not been contacted by [facility] for in lab titration study.  Can you help with this?

## 2019-12-11 DIAGNOSIS — Z01818 Encounter for other preprocedural examination: Secondary | ICD-10-CM | POA: Diagnosis not present

## 2019-12-11 NOTE — Telephone Encounter (Signed)
Patient has not been contacted by [facility].  Synetta Fail, can you help with this? Thanks

## 2019-12-13 DIAGNOSIS — Z8371 Family history of colonic polyps: Secondary | ICD-10-CM | POA: Diagnosis not present

## 2019-12-13 DIAGNOSIS — Z1211 Encounter for screening for malignant neoplasm of colon: Secondary | ICD-10-CM | POA: Diagnosis not present

## 2019-12-13 DIAGNOSIS — K5730 Diverticulosis of large intestine without perforation or abscess without bleeding: Secondary | ICD-10-CM | POA: Diagnosis not present

## 2019-12-13 DIAGNOSIS — K640 First degree hemorrhoids: Secondary | ICD-10-CM | POA: Diagnosis not present

## 2019-12-13 DIAGNOSIS — Z800 Family history of malignant neoplasm of digestive organs: Secondary | ICD-10-CM | POA: Diagnosis not present

## 2019-12-31 DIAGNOSIS — Z20822 Contact with and (suspected) exposure to covid-19: Secondary | ICD-10-CM | POA: Diagnosis not present

## 2020-01-27 ENCOUNTER — Other Ambulatory Visit (INDEPENDENT_AMBULATORY_CARE_PROVIDER_SITE_OTHER): Payer: Medicare PPO

## 2020-01-27 ENCOUNTER — Other Ambulatory Visit: Payer: Self-pay

## 2020-01-27 DIAGNOSIS — Z1159 Encounter for screening for other viral diseases: Secondary | ICD-10-CM | POA: Diagnosis not present

## 2020-01-27 DIAGNOSIS — E7800 Pure hypercholesterolemia, unspecified: Secondary | ICD-10-CM

## 2020-01-27 DIAGNOSIS — R5383 Other fatigue: Secondary | ICD-10-CM

## 2020-01-27 DIAGNOSIS — R739 Hyperglycemia, unspecified: Secondary | ICD-10-CM

## 2020-01-27 LAB — CBC WITH DIFFERENTIAL/PLATELET
Basophils Absolute: 0 10*3/uL (ref 0.0–0.1)
Basophils Relative: 0.8 % (ref 0.0–3.0)
Eosinophils Absolute: 0.2 10*3/uL (ref 0.0–0.7)
Eosinophils Relative: 3.5 % (ref 0.0–5.0)
HCT: 40.2 % (ref 36.0–46.0)
Hemoglobin: 13.8 g/dL (ref 12.0–15.0)
Lymphocytes Relative: 57.9 % — ABNORMAL HIGH (ref 12.0–46.0)
Lymphs Abs: 3.1 10*3/uL (ref 0.7–4.0)
MCHC: 34.3 g/dL (ref 30.0–36.0)
MCV: 88.7 fl (ref 78.0–100.0)
Monocytes Absolute: 0.3 10*3/uL (ref 0.1–1.0)
Monocytes Relative: 5.7 % (ref 3.0–12.0)
Neutro Abs: 1.7 10*3/uL (ref 1.4–7.7)
Neutrophils Relative %: 32.1 % — ABNORMAL LOW (ref 43.0–77.0)
Platelets: 268 10*3/uL (ref 150.0–400.0)
RBC: 4.53 Mil/uL (ref 3.87–5.11)
RDW: 13.1 % (ref 11.5–15.5)
WBC: 5.3 10*3/uL (ref 4.0–10.5)

## 2020-01-27 LAB — BASIC METABOLIC PANEL
BUN: 20 mg/dL (ref 6–23)
CO2: 26 mEq/L (ref 19–32)
Calcium: 10.2 mg/dL (ref 8.4–10.5)
Chloride: 104 mEq/L (ref 96–112)
Creatinine, Ser: 0.78 mg/dL (ref 0.40–1.20)
GFR: 78.28 mL/min (ref >60.00)
Glucose, Bld: 94 mg/dL (ref 70–99)
Potassium: 4.5 mEq/L (ref 3.5–5.1)
Sodium: 140 mEq/L (ref 135–145)

## 2020-01-27 LAB — LIPID PANEL
Cholesterol: 250 mg/dL — ABNORMAL HIGH (ref 0–200)
HDL: 61.6 mg/dL (ref >39.00)
LDL Cholesterol: 150 mg/dL — ABNORMAL HIGH (ref 0–99)
NonHDL: 188.83
Total CHOL/HDL Ratio: 4
Triglycerides: 193 mg/dL — ABNORMAL HIGH (ref 0.0–149.0)
VLDL: 38.6 mg/dL (ref 0.0–40.0)

## 2020-01-27 LAB — HEPATIC FUNCTION PANEL
ALT: 17 U/L (ref 0–35)
AST: 17 U/L (ref 0–37)
Albumin: 4.7 g/dL (ref 3.5–5.2)
Alkaline Phosphatase: 48 U/L (ref 39–117)
Bilirubin, Direct: 0.1 mg/dL (ref 0.0–0.3)
Total Bilirubin: 0.5 mg/dL (ref 0.2–1.2)
Total Protein: 7.4 g/dL (ref 6.0–8.3)

## 2020-01-27 LAB — TSH: TSH: 7.47 u[IU]/mL — ABNORMAL HIGH (ref 0.35–4.50)

## 2020-01-27 LAB — VITAMIN B12: Vitamin B-12: 921 pg/mL — ABNORMAL HIGH (ref 211–911)

## 2020-01-27 LAB — HEMOGLOBIN A1C: Hgb A1c MFr Bld: 6 % (ref 4.6–6.5)

## 2020-01-28 ENCOUNTER — Ambulatory Visit: Payer: Medicare PPO | Attending: Pulmonary Disease

## 2020-01-28 DIAGNOSIS — G4739 Other sleep apnea: Secondary | ICD-10-CM | POA: Diagnosis not present

## 2020-01-28 DIAGNOSIS — G4733 Obstructive sleep apnea (adult) (pediatric): Secondary | ICD-10-CM | POA: Diagnosis not present

## 2020-01-28 LAB — HEPATITIS C ANTIBODY
Hepatitis C Ab: NONREACTIVE
SIGNAL TO CUT-OFF: 0.01 (ref <1.00)

## 2020-01-29 ENCOUNTER — Other Ambulatory Visit: Payer: Self-pay

## 2020-01-29 ENCOUNTER — Encounter: Payer: Self-pay | Admitting: Internal Medicine

## 2020-01-29 ENCOUNTER — Ambulatory Visit (INDEPENDENT_AMBULATORY_CARE_PROVIDER_SITE_OTHER): Payer: Medicare PPO | Admitting: Internal Medicine

## 2020-01-29 VITALS — BP 122/70 | HR 90 | Temp 97.8°F | Resp 16 | Ht 60.0 in | Wt 137.0 lb

## 2020-01-29 DIAGNOSIS — Z86010 Personal history of colonic polyps: Secondary | ICD-10-CM

## 2020-01-29 DIAGNOSIS — M25511 Pain in right shoulder: Secondary | ICD-10-CM

## 2020-01-29 DIAGNOSIS — G4733 Obstructive sleep apnea (adult) (pediatric): Secondary | ICD-10-CM | POA: Diagnosis not present

## 2020-01-29 DIAGNOSIS — K219 Gastro-esophageal reflux disease without esophagitis: Secondary | ICD-10-CM

## 2020-01-29 DIAGNOSIS — R7989 Other specified abnormal findings of blood chemistry: Secondary | ICD-10-CM

## 2020-01-29 DIAGNOSIS — Z0001 Encounter for general adult medical examination with abnormal findings: Secondary | ICD-10-CM | POA: Diagnosis not present

## 2020-01-29 DIAGNOSIS — M25551 Pain in right hip: Secondary | ICD-10-CM

## 2020-01-29 DIAGNOSIS — R739 Hyperglycemia, unspecified: Secondary | ICD-10-CM | POA: Diagnosis not present

## 2020-01-29 DIAGNOSIS — Z0000 Encounter for general adult medical examination without abnormal findings: Secondary | ICD-10-CM

## 2020-01-29 DIAGNOSIS — E7800 Pure hypercholesterolemia, unspecified: Secondary | ICD-10-CM

## 2020-01-29 DIAGNOSIS — R0789 Other chest pain: Secondary | ICD-10-CM

## 2020-01-29 DIAGNOSIS — R0602 Shortness of breath: Secondary | ICD-10-CM

## 2020-01-29 DIAGNOSIS — F439 Reaction to severe stress, unspecified: Secondary | ICD-10-CM | POA: Diagnosis not present

## 2020-01-29 DIAGNOSIS — M25512 Pain in left shoulder: Secondary | ICD-10-CM

## 2020-01-29 DIAGNOSIS — H6121 Impacted cerumen, right ear: Secondary | ICD-10-CM

## 2020-01-29 NOTE — Assessment & Plan Note (Addendum)
Low cholesterol diet and exercise.  Check lipid panel today.   

## 2020-01-29 NOTE — Progress Notes (Signed)
Patient ID: Jessica Peck, female   DOB: Jul 16, 1950, 69 y.o.   MRN: [id number]   Subjective:    Patient ID: Jessica Peck, female    DOB: [birth year], 69 y.o.   MRN: [id number]  HPI This visit occurred during the SARS-CoV-2 public health emergency.  Safety protocols were in place, including screening questions prior to the visit, additional usage of staff PPE, and extensive cleaning of exam room while observing appropriate contact time as indicated for disinfecting solutions.  Patient here for her physical exam.  Has several concerns.  Has had two episodes of chest heaviness.  Woke her from sleep.  May have lasted 30 minutes.  Got up and walked around.  Has not noticed any chest pain with exertion, but has noticed being moe sob with exertion.  Is being evaluated by pulmonary for sleep apnea.  Had f/u sleep study last night.  Was noted on a home study to have decreased oxygen saturation.  She is also having increased pain in both shoulders and in her right hip - to knee.  Has seen ortho.  Is s/p right shoulder steroid injection. Helped some.  Left shoulder bothering her more now.  Has meloxicam.  Takes prn.  Previous MRI left shoulder - bone spur.  Sates bone on bone - right shoulder.  Right hip pain - pain with palpation or pressure on the area.  Affecting her activity.  Has f/u with ortho next week.  No cough or congestion.  No acid reflux.  Controlled.  No abdominal pain.  Bowels stable. Had colonoscopy.  Need report.  Also has noticed decreased hearing.  Discussed labs.    Past Medical History:  Diagnosis Date  . Chronic constipation   . Esophagitis   . Gastritis    EGD (hiatal hernia)  . GERD (gastroesophageal reflux disease)   . Hypercholesterolemia   . Sleep apnea    CPAP  . Vitamin D deficiency    Past Surgical History:  Procedure Laterality Date  . APPENDECTOMY  1976   Family History  Problem Relation Age of Onset  . Colon cancer Father   . Colon cancer Mother   . COPD  Mother   . Heart disease Mother   . Breast cancer Sister        died age 81  . Breast cancer Maternal Uncle 60  . Breast cancer Paternal Aunt 61   Social History   Socioeconomic History  . Marital status: Married    Spouse name: Not on file  . Number of children: 2  . Years of education: Not on file  . Highest education level: Not on file  Occupational History  . Not on file  Tobacco Use  . Smoking status: Never Smoker  . Smokeless tobacco: Never Used  Substance and Sexual Activity  . Alcohol use: Yes    Alcohol/week: 0.0 standard drinks    Comment: occasional  . Drug use: No  . Sexual activity: Not on file  Other Topics Concern  . Not on file  Social History Narrative  . Not on file   Social Determinants of Health   Financial Resource Strain:   . Difficulty of Paying Living Expenses: Not on file  Food Insecurity:   . Worried About Charity fundraiser in the Last Year: Not on file  . Ran Out of Food in the Last Year: Not on file  Transportation Needs:   . Lack of Transportation (Medical): Not on file  . Lack of Transportation (  Non-Medical): Not on file  Physical Activity:   . Days of Exercise per Week: Not on file  . Minutes of Exercise per Session: Not on file  Stress:   . Feeling of Stress : Not on file  Social Connections:   . Frequency of Communication with Friends and Family: Not on file  . Frequency of Social Gatherings with Friends and Family: Not on file  . Attends Religious Services: Not on file  . Active Member of Clubs or Organizations: Not on file  . Attends Archivist Meetings: Not on file  . Marital Status: Not on file    Outpatient Encounter Medications as of 01/29/2020  Medication Sig  . Cyanocobalamin (RA VITAMIN B-12 TR) 1000 MCG TBCR Take by mouth.  . Melatonin 3 MG TABS Take 0.5 tablets by mouth at bedtime.  . meloxicam (MOBIC) 7.5 MG tablet Take 7.5 mg by mouth daily.  . pantoprazole (PROTONIX) 20 MG tablet ONE TO TWO TABLETS  EVERY DAY  . sertraline (ZOLOFT) 50 MG tablet TAKE 1 AND 1/2 TABLET PER DAY  . VITAMIN D, CHOLECALCIFEROL, PO Take 4,000 Units by mouth daily.   . [DISCONTINUED] conjugated estrogens (PREMARIN) vaginal cream Place vaginally as needed.  . [DISCONTINUED] Turmeric 500 MG CAPS Take by mouth daily.   No facility-administered encounter medications on file as of 01/29/2020.    Review of Systems  Constitutional: Negative for appetite change and unexpected weight change.  HENT: Positive for hearing loss. Negative for congestion, sinus pressure and sore throat.   Eyes: Negative for pain and visual disturbance.  Respiratory: Negative for cough, shortness of breath and wheezing.   Cardiovascular: Positive for chest pain. Negative for palpitations and leg swelling.  Gastrointestinal: Negative for abdominal pain, diarrhea, nausea and vomiting.  Genitourinary: Negative for difficulty urinating and dysuria.  Musculoskeletal: Negative for myalgias.       Bilateral shoulder pain and right hip pain as outlined.    Skin: Negative for color change and rash.  Neurological: Negative for dizziness, light-headedness and headaches.  Hematological: Negative for adenopathy. Does not bruise/bleed easily.  Psychiatric/Behavioral: Negative for agitation and dysphoric mood.       Objective:    Physical Exam Vitals reviewed.  Constitutional:      General: She is not in acute distress.    Appearance: Normal appearance. She is well-developed.  HENT:     Head: Normocephalic and atraumatic.     Right Ear: External ear normal. There is impacted cerumen.     Left Ear: External ear normal.  Eyes:     General: No scleral icterus.       Right eye: No discharge.        Left eye: No discharge.     Conjunctiva/sclera: Conjunctivae normal.  Neck:     Thyroid: No thyromegaly.  Cardiovascular:     Rate and Rhythm: Normal rate and regular rhythm.  Pulmonary:     Effort: No tachypnea, accessory muscle usage or  respiratory distress.     Breath sounds: Normal breath sounds. No decreased breath sounds or wheezing.  Chest:     Breasts:        Right: No inverted nipple, mass, nipple discharge or tenderness (no axillary adenopathy).        Left: No inverted nipple, mass, nipple discharge or tenderness (no axilarry adenopathy).  Abdominal:     General: Bowel sounds are normal.     Palpations: Abdomen is soft.     Tenderness: There is no abdominal  tenderness.  Musculoskeletal:        General: No swelling or tenderness.     Cervical back: Neck supple. No tenderness.     Comments: Pain to palpation - right lateral hip.  No pain in groin with abduction/adduction or rotation of hip.  No pain in back with SLR.   Lymphadenopathy:     Cervical: No cervical adenopathy.  Skin:    Findings: No erythema or rash.  Neurological:     Mental Status: She is alert and oriented to person, place, and time.  Psychiatric:        Mood and Affect: Mood normal.        Behavior: Behavior normal.     BP 122/70   Pulse 90   Temp 97.8 F (36.6 C) (Oral)   Resp 16   Ht 5' (1.524 m)   Wt 137 lb (62.1 kg)   LMP 06/04/2000   SpO2 98%   BMI 26.76 kg/m  Wt Readings from Last 3 Encounters:  01/29/20 137 lb (62.1 kg)  10/28/19 138 lb 6.4 oz (62.8 kg)  09/25/19 139 lb (63 kg)     Lab Results  Component Value Date   WBC 5.3 01/27/2020   HGB 13.8 01/27/2020   HCT 40.2 01/27/2020   PLT 268.0 01/27/2020   GLUCOSE 94 01/27/2020   CHOL 250 (H) 01/27/2020   TRIG 193.0 (H) 01/27/2020   HDL 61.60 01/27/2020   LDLDIRECT 146.0 09/23/2019   LDLCALC 150 (H) 01/27/2020   ALT 17 01/27/2020   AST 17 01/27/2020   NA 140 01/27/2020   K 4.5 01/27/2020   CL 104 01/27/2020   CREATININE 0.78 01/27/2020   BUN 20 01/27/2020   CO2 26 01/27/2020   TSH 7.47 (H) 01/27/2020   HGBA1C 6.0 01/27/2020    MM 3D SCREEN BREAST BILATERAL  Result Date: 11/22/2019 CLINICAL DATA:  Screening. EXAM: DIGITAL SCREENING BILATERAL MAMMOGRAM  WITH TOMO AND CAD COMPARISON:  Previous exam(s). ACR Breast Density Category b: There are scattered areas of fibroglandular density. FINDINGS: There are no findings suspicious for malignancy. Images were processed with CAD. IMPRESSION: No mammographic evidence of malignancy. A result letter of this screening mammogram will be mailed directly to the patient. RECOMMENDATION: Screening mammogram in one year. (Code:SM-B-01Y) BI-RADS CATEGORY  1: Negative. Electronically Signed   By: Claudie Revering M.D.   On: 11/22/2019 13:31       Assessment & Plan:   Problem List Items Addressed This Visit    Stress    Doing well on current dose of zoloft.  Follow.       SOB (shortness of breath)    Has had the two episodes of chest pain that woke her from sleep.  Has also noticed sob with exertion.  EKG today - SR with no acute ischemic changes.  Discussed further cardiac w/up. Has strong family history of heart disease. Discussed further testing, including calcium scoring, etc.  Will refer to cardiology for further evaluation and w/up.  Any change or worsening symptoms, she is to be evaluated.        Relevant Orders   Ambulatory referral to Cardiology   Shoulder pain    Bilateral shoulder pain.  Is s/p injection in right shoulder. Has meloxicam that she is taking prn.  Discussed risk and side effects of the medication.  Has f/u with ortho next week.       Right hip pain    Persistent.  Pain to palpation - right lateral hip -  question of bursitis.  Has f/u with ortho next week. Taking meloxicam prn.        Pure hypercholesterolemia    Low cholesterol diet and exercise.  Check lipid panel today.       Obstructive sleep apnea    Recent home study per pt revealed decreased oxygen saturation.  Seeing pulmonary.  Had f/u sleep study last night.  F/u with pulmonary regarding results and further treatment.        Hyperglycemia    Low carb diet and exercise.  Follow met b and a1c.       History of colonic  polyps    Just had colonoscopy.  Need report.  Nurse to request.       Health care maintenance    Physical 01/29/20.  PAP 12/17/18 - negative with negative HPV.  Colonoscopy 01/2015 - hyperplastic polyp.  Recommended f/u colonoscopy in 01/2020.  States just had f/u colonoscopy.  Need report.       GERD (gastroesophageal reflux disease)    Doing well on current dose of protonix.  Follow.       Elevated TSH    Elevated tsh on recent labs.  Recheck tsh in 6-8 weeks.        Relevant Orders   TSH   Chest tightness    Chest tightness as outlined. Woke her from sleep.  Being worked up for sleep apnea.  Has noticed some sob with exertion as well.  Strong family history of heart disease.  Given symptoms and family history, EKG - SR with no acute ischemic changes.  Discussed further w/up and evaluation.  Discussed further testing, including calcium scoring, etc.  Refer to cardiology for further evaluation and treatment.        Relevant Orders   EKG 12-Lead (Completed)   Ambulatory referral to Cardiology   Cerumen impaction    Right ear cerumen impaction.  Will use ear wax softener.  Return for ear irrigation.         Other Visit Diagnoses    Encounter for general adult medical examination with abnormal findings    -  Primary       Einar Pheasant, MD

## 2020-01-29 NOTE — Assessment & Plan Note (Addendum)
Physical 01/29/20.  PAP 12/17/18 - negative with negative HPV.  Colonoscopy 01/2015 - hyperplastic polyp.  Recommended f/u colonoscopy in 01/2020.  States just had f/u colonoscopy.  Need report.

## 2020-01-30 ENCOUNTER — Encounter: Payer: Self-pay | Admitting: Internal Medicine

## 2020-01-30 DIAGNOSIS — R7989 Other specified abnormal findings of blood chemistry: Secondary | ICD-10-CM | POA: Insufficient documentation

## 2020-01-30 DIAGNOSIS — H6120 Impacted cerumen, unspecified ear: Secondary | ICD-10-CM | POA: Insufficient documentation

## 2020-01-30 NOTE — Assessment & Plan Note (Signed)
Doing well on current dose of protonix.  Follow.

## 2020-01-30 NOTE — Assessment & Plan Note (Signed)
Low carb diet and exercise.  Follow met b and a1c.  

## 2020-01-30 NOTE — Assessment & Plan Note (Signed)
Doing well on current dose of zoloft.  Follow.

## 2020-01-30 NOTE — Assessment & Plan Note (Signed)
Elevated tsh on recent labs.  Recheck tsh in 6-8 weeks.

## 2020-01-30 NOTE — Assessment & Plan Note (Signed)
Has had the two episodes of chest pain that woke her from sleep.  Has also noticed sob with exertion.  EKG today - SR with no acute ischemic changes.  Discussed further cardiac w/up. Has strong family history of heart disease. Discussed further testing, including calcium scoring, etc.  Will refer to cardiology for further evaluation and w/up.  Any change or worsening symptoms, she is to be evaluated.

## 2020-01-30 NOTE — Assessment & Plan Note (Signed)
Recent home study per pt revealed decreased oxygen saturation.  Seeing pulmonary.  Had f/u sleep study last night.  F/u with pulmonary regarding results and further treatment.

## 2020-01-30 NOTE — Assessment & Plan Note (Signed)
Bilateral shoulder pain.  Is s/p injection in right shoulder. Has meloxicam that she is taking prn.  Discussed risk and side effects of the medication.  Has f/u with ortho next week.

## 2020-01-30 NOTE — Assessment & Plan Note (Signed)
Persistent.  Pain to palpation - right lateral hip - question of bursitis.  Has f/u with ortho next week. Taking meloxicam prn.

## 2020-01-30 NOTE — Assessment & Plan Note (Signed)
Just had colonoscopy.  Need report.  Nurse to request.

## 2020-01-30 NOTE — Assessment & Plan Note (Signed)
Right ear cerumen impaction.  Will use ear wax softener.  Return for ear irrigation.

## 2020-01-30 NOTE — Assessment & Plan Note (Signed)
Chest tightness as outlined. Woke her from sleep.  Being worked up for sleep apnea.  Has noticed some sob with exertion as well.  Strong family history of heart disease.  Given symptoms and family history, EKG - SR with no acute ischemic changes.  Discussed further w/up and evaluation.  Discussed further testing, including calcium scoring, etc.  Refer to cardiology for further evaluation and treatment.

## 2020-01-31 ENCOUNTER — Telehealth (INDEPENDENT_AMBULATORY_CARE_PROVIDER_SITE_OTHER): Payer: Medicare PPO | Admitting: Pulmonary Disease

## 2020-01-31 ENCOUNTER — Other Ambulatory Visit: Payer: Self-pay

## 2020-01-31 DIAGNOSIS — G4733 Obstructive sleep apnea (adult) (pediatric): Secondary | ICD-10-CM

## 2020-01-31 NOTE — Telephone Encounter (Signed)
CPAP 6- 9 cm with small nasal pillows Central apneas worse on higher pressures

## 2020-01-31 NOTE — Telephone Encounter (Signed)
Is this from cpap titration?

## 2020-02-03 NOTE — Telephone Encounter (Signed)
Yes indeed 

## 2020-02-03 NOTE — Telephone Encounter (Signed)
Yes - titration from [facility] sleep lab

## 2020-02-03 NOTE — Telephone Encounter (Signed)
Please place an order for CPAP auto 6-9cm h20, small nasal pillow mask. Needs follow-up in 31-90 days.

## 2020-02-03 NOTE — Telephone Encounter (Addendum)
Called and spoke with pt letting her know the results of the sleep study per [facility] and stated to her that Beth wanted [place] to order CPAP.  After stating that to pt, she said that she would rather meet with an MD to further discuss the results as she is unsure if she wants to begin CPAP. Pt said she has only met with an APP and has not seen an MD yet and she wants to schedule an appt with an MD.  Dr. Annamaria Boots, please advise if we can use one of your held slots for pt appt.

## 2020-02-03 NOTE — Telephone Encounter (Signed)
Called and spoke with pt to get her scheduled for an appt with CY. appt has been scheduled for pt with CY 11/10. Nothing further needed.

## 2020-02-04 NOTE — Progress Notes (Signed)
II/10/21- 69 yoF for sleep evaluation due to OSA Medical problem list includes GERD, Hypercholesterolemia,  Previous limited experience with CPAP- poorly tolerated in 2013 with AHI 12.4 at that time.  HST 11/18/19 AHI 33/ hr, desaturation to 68%, body weight 138 lbs Had CPAP titration at [facility] recommending CPAP auto 6-9, small nasal pillows.  Seen initially by Clent Ridges, NP on 10/28/19 on referral from Dr Dale Winslow with c/o morning fatigue, snoring, restless sleep. Melatonin 3 mg hs.  She wanted to discuss options with physician. Body weight-138 lbs Epworth score- Covid vax- 3 Phizer Flu vax- had Neg family hx OSA. She has awakened with chest pressure/ anxious- suspects this was apnea but she has cardiology eval pending. Denies personal hx heart/ lung disease. Husband tells her of noisy breathing/ ?snoring. Not much aware of daytime sleepiness- admits occasionally would like a nap. Minimizes impact on alertness/ driving.  2 cups AM 1/2 caff. Melatonin 3 mg at hs. ENT surgery+ tonsils. Retired Runner, broadcasting/film/video.   Prior to Admission medications   Medication Sig Start Date End Date Taking? Authorizing Provider  Cyanocobalamin (RA VITAMIN B-12 TR) 1000 MCG TBCR Take by mouth.   Yes [provider]  Melatonin 3 MG TABS Take 0.5 tablets by mouth at bedtime.   Yes [provider]  meloxicam (MOBIC) 7.5 MG tablet Take 7.5 mg by mouth daily. 08/21/19  Yes [provider]  pantoprazole (PROTONIX) 20 MG tablet ONE TO TWO TABLETS EVERY DAY 09/25/19  Yes Dale [place], MD  sertraline (ZOLOFT) 50 MG tablet TAKE 1 AND 1/2 TABLET PER DAY 02/05/20  Yes Dale , MD  VITAMIN D, CHOLECALCIFEROL, PO Take 4,000 Units by mouth daily.    Yes [provider]   Past Medical History:  Diagnosis Date  . Chronic constipation   . Esophagitis   . Gastritis    EGD (hiatal hernia)  . GERD (gastroesophageal reflux disease)   . Hypercholesterolemia   . Sleep apnea    CPAP  . Vitamin D  deficiency    Past Surgical History:  Procedure Laterality Date  . APPENDECTOMY  1976   Family History  Problem Relation Age of Onset  . Colon cancer Father   . Colon cancer Mother   . COPD Mother   . Heart disease Mother   . Breast cancer Sister        died age 87  . Breast cancer Maternal Uncle 60  . Breast cancer Paternal Aunt 64   Social History   Socioeconomic History  . Marital status: Married    Spouse name: Not on file  . Number of children: 2  . Years of education: Not on file  . Highest education level: Not on file  Occupational History  . Not on file  Tobacco Use  . Smoking status: Never Smoker  . Smokeless tobacco: Never Used  Substance and Sexual Activity  . Alcohol use: Yes    Alcohol/week: 0.0 standard drinks    Comment: occasional  . Drug use: No  . Sexual activity: Not on file  Other Topics Concern  . Not on file  Social History Narrative  . Not on file   Social Determinants of Health   Financial Resource Strain:   . Difficulty of Paying Living Expenses: Not on file  Food Insecurity:   . Worried About Programme researcher, broadcasting/film/video in the Last Year: Not on file  . Ran Out of Food in the Last Year: Not on file  Transportation Needs:   .  Lack of Transportation (Medical): Not on file  . Lack of Transportation (Non-Medical): Not on file  Physical Activity:   . Days of Exercise per Week: Not on file  . Minutes of Exercise per Session: Not on file  Stress:   . Feeling of Stress : Not on file  Social Connections:   . Frequency of Communication with Friends and Family: Not on file  . Frequency of Social Gatherings with Friends and Family: Not on file  . Attends Religious Services: Not on file  . Active Member of Clubs or Organizations: Not on file  . Attends Banker Meetings: Not on file  . Marital Status: Not on file  Intimate Partner Violence:   . Fear of Current or Ex-Partner: Not on file  . Emotionally Abused: Not on file  . Physically  Abused: Not on file  . Sexually Abused: Not on file   ROS-see HPI   + = positive Constitutional:    weight loss, night sweats, fevers, chills, fatigue, lassitude. HEENT:    headaches, difficulty swallowing, tooth/dental problems, sore throat,       sneezing, itching, ear ache, nasal congestion, post nasal drip, snoring CV:    chest pain, orthopnea, PND, swelling in lower extremities, anasarca,                                  dizziness, palpitations Resp:   shortness of breath with exertion or at rest.                productive cough,   non-productive cough, coughing up of blood.              change in color of mucus.  wheezing.   Skin:    rash or lesions. GI:  No-   heartburn, indigestion, abdominal pain, nausea, vomiting, diarrhea,                 change in bowel habits, loss of appetite GU: dysuria, change in color of urine, no urgency or frequency.   flank pain. MS:   joint pain, stiffness, decreased range of motion, back pain. Neuro-     nothing unusual Psych:  change in mood or affect.  depression or anxiety.   memory loss.  OBJ- Physical Exam General- Alert, Oriented, Affect-appropriate, Distress- none acute, + not obese Skin- rash-none, lesions- none, excoriation- none Lymphadenopathy- none Head- atraumatic            Eyes- Gross vision intact, PERRLA, conjunctivae and secretions clear            Ears- Hearing, canals-normal            Nose- Clear, no-Septal dev, mucus, polyps, erosion, perforation             Throat- Mallampati IV , mucosa clear , drainage- none, tonsils absent, +teeth Neck- flexible , trachea midline, no stridor , thyroid nl, carotid no bruit Chest - symmetrical excursion , unlabored           Heart/CV- RRR , no murmur , no gallop  , no rub, nl s1 s2                           - JVD- none , edema- none, stasis changes- none, varices- none           Lung- clear to P&A, wheeze- none, cough- none ,  dullness-none, rub- none           Chest wall-  Abd-  Br/  Gen/ Rectal- Not done, not indicated Extrem- cyanosis- none, clubbing, none, atrophy- none, strength- nl Neuro- grossly intact to observation

## 2020-02-05 ENCOUNTER — Encounter: Payer: Self-pay | Admitting: Internal Medicine

## 2020-02-05 ENCOUNTER — Ambulatory Visit (INDEPENDENT_AMBULATORY_CARE_PROVIDER_SITE_OTHER): Payer: Medicare PPO

## 2020-02-05 ENCOUNTER — Ambulatory Visit: Payer: Medicare PPO | Admitting: Internal Medicine

## 2020-02-05 ENCOUNTER — Other Ambulatory Visit: Payer: Self-pay | Admitting: Internal Medicine

## 2020-02-05 ENCOUNTER — Other Ambulatory Visit: Payer: Self-pay

## 2020-02-05 VITALS — BP 118/76 | HR 52 | Temp 97.1°F | Ht 60.0 in | Wt 138.8 lb

## 2020-02-05 DIAGNOSIS — H6121 Impacted cerumen, right ear: Secondary | ICD-10-CM | POA: Diagnosis not present

## 2020-02-05 DIAGNOSIS — G4733 Obstructive sleep apnea (adult) (pediatric): Secondary | ICD-10-CM | POA: Diagnosis not present

## 2020-02-05 DIAGNOSIS — M1611 Unilateral primary osteoarthritis, right hip: Secondary | ICD-10-CM | POA: Diagnosis not present

## 2020-02-05 NOTE — Patient Instructions (Signed)
Order- referral to Dr Althea Grimmer, orthodontist    consider oral appliance for OSA  Order- referral to Dr Christia Reading, ENT    Consider options for OSA  Please call if we can help

## 2020-02-05 NOTE — Progress Notes (Addendum)
Patient presented for ear irrigation due to cerumen impaction. Patient was informed of the possible side effects of having their ear flushed; light headedness, dizziness, nausea, vomiting and rupture ear drum. Items sed or that can be used are the elephant pump, catch basin, ear curettes, hydrogen peroxide (half a bottle for ear irrigation solution), and a stool softener (1-2CC for softening ear wax).  Patient has given a verbal consent to have ear irrigation. patient voiced no concerns nor showed any signs of distress during procedure.   I have reviewed the above information and agree with above.   Duncan Dull, MD

## 2020-02-05 NOTE — Assessment & Plan Note (Signed)
She didn't like experience with CPAP in 2013, but says she got no explanation and no help with CPAP from unknown DME. She may eventually be willing to try again, but accepts offer to learn about oral appliance and ENT options first. She has little symptom awareness and will benefit from explanation and attention to comfort. Plan- referrals to ENT and Orthodontist to explore options. I will be happy to see her again as needed.

## 2020-02-06 NOTE — Addendum Note (Signed)
Addended by: Luna Kitchens D on: 02/06/2020 10:12 AM   Modules accepted: Orders

## 2020-02-13 ENCOUNTER — Ambulatory Visit: Payer: Medicare PPO | Admitting: Cardiology

## 2020-02-17 ENCOUNTER — Ambulatory Visit: Payer: Medicare PPO | Admitting: Cardiology

## 2020-02-17 ENCOUNTER — Other Ambulatory Visit: Payer: Self-pay

## 2020-02-17 ENCOUNTER — Encounter: Payer: Self-pay | Admitting: Cardiology

## 2020-02-17 VITALS — BP 132/80 | HR 62 | Ht 61.0 in | Wt 138.0 lb

## 2020-02-17 DIAGNOSIS — E7800 Pure hypercholesterolemia, unspecified: Secondary | ICD-10-CM | POA: Diagnosis not present

## 2020-02-17 DIAGNOSIS — R079 Chest pain, unspecified: Secondary | ICD-10-CM

## 2020-02-17 DIAGNOSIS — R072 Precordial pain: Secondary | ICD-10-CM | POA: Diagnosis not present

## 2020-02-17 DIAGNOSIS — R0600 Dyspnea, unspecified: Secondary | ICD-10-CM

## 2020-02-17 DIAGNOSIS — R0609 Other forms of dyspnea: Secondary | ICD-10-CM

## 2020-02-17 MED ORDER — METOPROLOL TARTRATE 50 MG PO TABS: 50.0000 mg | ORAL_TABLET | Freq: Once | ORAL | 0 refills | Status: DC

## 2020-02-17 NOTE — Patient Instructions (Addendum)
Medication Instructions:   Your physician recommends that you continue on your current medications as directed. Please refer to the Current Medication list given to you today.  *If you need a refill on your cardiac medications before your next appointment, please call your pharmacy*   Lab Work:  Your physician recommends that you return for lab work in: get a lab draw (BMP) within 2 weeks of your CTA being scheduled.  - Please go to the [facility]. You will check in at the front desk to the right as you walk into the atrium. Valet Parking is offered if needed. - No appointment needed. You may go any day between 7 am and 6 pm.    Testing/Procedures:  1.  Your physician has requested that you have an echocardiogram. Echocardiography is a painless test that uses sound waves to create images of your heart. It provides your doctor with information about the size and shape of your heart and how well your heart's chambers and valves are working. This procedure takes approximately one hour. There are no restrictions for this procedure.  2.  Your physician has requested that you have coronary cardiac CT. Cardiac computed tomography (CT) is a painless test that uses an x-ray machine to take clear, detailed pictures of your heart.   Your cardiac CT will be scheduled at:  [facility] [address] [place], [place] [address] [phone]  Please arrive 15 mins early for check-in and test prep.   Please follow these instructions carefully (unless otherwise directed):    On the Night Before the Test: . Be sure to Drink plenty of water. . Do not consume any caffeinated/decaffeinated beverages or chocolate 12 hours prior to your test. . Do not take any antihistamines 12 hours prior to your test.   On the Day of the Test: . Drink plenty of water. Do not drink any water within one hour of the test. . Do not eat any food 4 hours prior to  the test. . You may take your regular medications prior to the test.  . Take metoprolol (Lopressor) two hours prior to test. (sent to [facility] . FEMALES- please wear underwire-free bra if available   After the Test: . Drink plenty of water. . After receiving IV contrast, you may experience a mild flushed feeling. This is normal. . On occasion, you may experience a mild rash up to 24 hours after the test. This is not dangerous. If this occurs, you can take Benadryl 25 mg and increase your fluid intake. . If you experience trouble breathing, this can be serious. If it is severe call 911 IMMEDIATELY. If it is mild, please call our office.    Once we have confirmed authorization from your insurance company, we will call you to set up a date and time for your test. Based on how quickly your insurance processes prior authorizations requests, please allow up to 4 weeks to be contacted for scheduling your Cardiac CT appointment. Be advised that routine Cardiac CT appointments could be scheduled as many as 8 weeks after your provider has ordered it.  For non-scheduling related questions, please contact the cardiac imaging nurse navigator should you have any questions/concerns: Marchia Bond, Cardiac Imaging Nurse Navigator Burley Saver, Interim Cardiac Imaging Nurse [place] [facility] Direct Office Dial: [phone]   For scheduling needs, including cancellations and rescheduling, please call [place], [phone] (temporary number).      Follow-Up:  At [facility], you and your health needs are our priority.  As part of our continuing mission to provide you with exceptional heart care, we have created designated Provider Care Teams.  These Care Teams include your primary Cardiologist (physician) and Advanced Practice Providers (APPs -  Physician Assistants and Nurse Practitioners) who all work together to provide you with the care you need, when you  need it.  We recommend signing up for the patient portal called "MyChart".  Sign up information is provided on this After Visit Summary.  MyChart is used to connect with patients for Virtual Visits (Telemedicine).  Patients are able to view lab/test results, encounter notes, upcoming appointments, etc.  Non-urgent messages can be sent to your provider as well.   To learn more about what you can do with MyChart, go to [email or web address].    Your next appointment:   Follow up after Echo and CTA   The format for your next appointment:   In Person  Provider:   Kate Sable, MD   Other Instructions

## 2020-02-17 NOTE — Progress Notes (Signed)
Cardiology Office Note:    Date:  02/17/2020   ID:  Jessica Peck, DOB 1950-10-03, MRN [id number]  PCP:  Einar Pheasant, MD  [facility] Cardiologist:  Kate Sable, MD  [facility] Electrophysiologist:  None   Referring MD: Einar Pheasant, MD   Chief Complaint  Patient presents with  . other    C/o chest tightness and sob. No complaints today. Pt refused EKG today. Meds reviewed verbally with pt.    History of Present Illness:    Jessica Peck is a 69 y.o. female with a hx of hyperlipidemia, OSA on CPAP who presents due to shortness of breath and chest tightness.  States symptoms of shortness of breath have been ongoing over the past 6 months.  Symptoms are associated with exertion which seems to have worsened.  Also endorses chest pressure occurring 3 times over the past month and a half.  Symptoms of chest discomfort not associated with exertion, typically waking her up from sleep.  Denies personal history of heart disease, history of CAD in her brother with multiple stents in his 72s.  Past Medical History:  Diagnosis Date  . Chronic constipation   . Esophagitis   . Gastritis    EGD (hiatal hernia)  . GERD (gastroesophageal reflux disease)   . Hypercholesterolemia   . Sleep apnea    CPAP  . Vitamin D deficiency     Past Surgical History:  Procedure Laterality Date  . APPENDECTOMY  1976    Current Medications: Current Meds  Medication Sig  . Cyanocobalamin (RA VITAMIN B-12 TR) 1000 MCG TBCR Take by mouth.  . Melatonin 3 MG TABS Take 0.5 tablets by mouth at bedtime.  . meloxicam (MOBIC) 7.5 MG tablet Take 7.5 mg by mouth daily as needed.   . pantoprazole (PROTONIX) 20 MG tablet ONE TO TWO TABLETS EVERY DAY  . sertraline (ZOLOFT) 50 MG tablet TAKE 1 AND 1/2 TABLET PER DAY  . VITAMIN D, CHOLECALCIFEROL, PO Take 4,000 Units by mouth daily.      Allergies:   Penicillins   Social History   Socioeconomic History  . Marital status: Married     Spouse name: Not on file  . Number of children: 2  . Years of education: Not on file  . Highest education level: Not on file  Occupational History  . Not on file  Tobacco Use  . Smoking status: Never Smoker  . Smokeless tobacco: Never Used  Substance and Sexual Activity  . Alcohol use: Yes    Alcohol/week: 0.0 standard drinks    Comment: occasional  . Drug use: No  . Sexual activity: Not on file  Other Topics Concern  . Not on file  Social History Narrative  . Not on file   Social Determinants of Health   Financial Resource Strain:   . Difficulty of Paying Living Expenses: Not on file  Food Insecurity:   . Worried About Charity fundraiser in the Last Year: Not on file  . Ran Out of Food in the Last Year: Not on file  Transportation Needs:   . Lack of Transportation (Medical): Not on file  . Lack of Transportation (Non-Medical): Not on file  Physical Activity:   . Days of Exercise per Week: Not on file  . Minutes of Exercise per Session: Not on file  Stress:   . Feeling of Stress : Not on file  Social Connections:   . Frequency of Communication with Friends and Family: Not on  file  . Frequency of Social Gatherings with Friends and Family: Not on file  . Attends Religious Services: Not on file  . Active Member of Clubs or Organizations: Not on file  . Attends Archivist Meetings: Not on file  . Marital Status: Not on file     Family History: The patient's family history includes Breast cancer in her sister; Breast cancer (age of onset: 57) in her maternal uncle and paternal aunt; COPD in her mother; Colon cancer in her father and mother; Heart disease in her mother.  ROS:   Please see the history of present illness.     All other systems reviewed and are negative.  EKGs/Labs/Other Studies Reviewed:    The following studies were reviewed today:   EKG:  EKG is not ordered today.    Prior ECG 01/29/2020 reviewed by myself shows normal sinus rhythm, old  inferior infarct.  Recent Labs: 01/27/2020: ALT 17; BUN 20; Creatinine, Ser 0.78; Hemoglobin 13.8; Platelets 268.0; Potassium 4.5; Sodium 140; TSH 7.47  Recent Lipid Panel    Component Value Date/Time   CHOL 250 (H) 01/27/2020 0824   TRIG 193.0 (H) 01/27/2020 0824   HDL 61.60 01/27/2020 0824   CHOLHDL 4 01/27/2020 0824   VLDL 38.6 01/27/2020 0824   LDLCALC 150 (H) 01/27/2020 0824   LDLDIRECT 146.0 09/23/2019 0839     Risk Assessment/Calculations:      Physical Exam:    VS:  BP 132/80 (BP Location: Right Arm, Patient Position: Sitting, Cuff Size: Normal)   Pulse 62   Ht _0  (1.549 m)   Wt 138 lb (62.6 kg)   LMP 06/04/2000   SpO2 98%   BMI 26.07 kg/m     Wt Readings from Last 3 Encounters:  02/17/20 138 lb (62.6 kg)  02/05/20 138 lb 12.8 oz (63 kg)  01/29/20 137 lb (62.1 kg)     GEN:  Well nourished, well developed in no acute distress HEENT: Normal NECK: No JVD; No carotid bruits LYMPHATICS: No lymphadenopathy CARDIAC: RRR, no murmurs, rubs, gallops RESPIRATORY:  Clear to auscultation without rales, wheezing or rhonchi  ABDOMEN: Soft, non-tender, non-distended MUSCULOSKELETAL:  No edema; No deformity  SKIN: Warm and dry NEUROLOGIC:  Alert and oriented x 3 PSYCHIATRIC:  Normal affect   ASSESSMENT:    1. Chest pain of uncertain etiology   2. Dyspnea on exertion   3. Pure hypercholesterolemia   4. Precordial pain    PLAN:    In order of problems listed above:  1. Chest pain, risk factors include hyperlipidemia, family history CAD, age.  Get echocardiogram, coronary CTA. 2. Dyspnea on exertion, echocardiogram as above.  3. Hyperlipidemia, 10-year ASCVD risk 7.0%.  Not in statin benefit group, low-cholesterol diet advised.  We will up after echo and coronary CTA.  Shared Decision Making/Informed Consent       Medication Adjustments/Labs and Tests Ordered: Current medicines are reviewed at length with the patient today.  Concerns regarding medicines  are outlined above.  Orders Placed This Encounter  Procedures  . CT CORONARY MORPH W/CTA COR W/SCORE W/CA W/CM &/OR WO/CM  . CT CORONARY FRACTIONAL FLOW RESERVE DATA PREP  . CT CORONARY FRACTIONAL FLOW RESERVE FLUID ANALYSIS  . Basic metabolic panel  . ECHOCARDIOGRAM COMPLETE   Meds ordered this encounter  Medications  . metoprolol tartrate (LOPRESSOR) 50 MG tablet    Sig: Take 1 tablet (50 mg total) by mouth once for 1 dose. Take 2 hours prior to your CT  scan.    Dispense:  1 tablet    Refill:  0    Patient Instructions   Medication Instructions:   Your physician recommends that you continue on your current medications as directed. Please refer to the Current Medication list given to you today.  *If you need a refill on your cardiac medications before your next appointment, please call your pharmacy*   Lab Work:  Your physician recommends that you return for lab work in: get a lab draw (BMP) within 2 weeks of your CTA being scheduled.  - Please go to the [facility] You will check in at the front desk to the right as you walk into the atrium. Valet Parking is offered if needed. - No appointment needed. You may go any day between 7 am and 6 pm.    Testing/Procedures:  1.  Your physician has requested that you have an echocardiogram. Echocardiography is a painless test that uses sound waves to create images of your heart. It provides your doctor with information about the size and shape of your heart and how well your heart's chambers and valves are working. This procedure takes approximately one hour. There are no restrictions for this procedure.  2.  Your physician has requested that you have coronary cardiac CT. Cardiac computed tomography (CT) is a painless test that uses an x-ray machine to take clear, detailed pictures of your heart.   Your cardiac CT will be scheduled at:  [facility] [address] [place],  [place] [address] [phone]  Please arrive 15 mins early for check-in and test prep.   Please follow these instructions carefully (unless otherwise directed):    On the Night Before the Test: . Be sure to Drink plenty of water. . Do not consume any caffeinated/decaffeinated beverages or chocolate 12 hours prior to your test. . Do not take any antihistamines 12 hours prior to your test.   On the Day of the Test: . Drink plenty of water. Do not drink any water within one hour of the test. . Do not eat any food 4 hours prior to the test. . You may take your regular medications prior to the test.  . Take metoprolol (Lopressor) two hours prior to test. (sent to [facility] . FEMALES- please wear underwire-free bra if available   After the Test: . Drink plenty of water. . After receiving IV contrast, you may experience a mild flushed feeling. This is normal. . On occasion, you may experience a mild rash up to 24 hours after the test. This is not dangerous. If this occurs, you can take Benadryl 25 mg and increase your fluid intake. . If you experience trouble breathing, this can be serious. If it is severe call 911 IMMEDIATELY. If it is mild, please call our office.    Once we have confirmed authorization from your insurance company, we will call you to set up a date and time for your test. Based on how quickly your insurance processes prior authorizations requests, please allow up to 4 weeks to be contacted for scheduling your Cardiac CT appointment. Be advised that routine Cardiac CT appointments could be scheduled as many as 8 weeks after your provider has ordered it.  For non-scheduling related questions, please contact the cardiac imaging nurse navigator should you have any questions/concerns: Marchia Bond, Cardiac Imaging Nurse Navigator Burley Saver, Interim Cardiac Imaging Nurse [place] [facility] Services Direct Office Dial: [phone]  For  scheduling needs, including cancellations and rescheduling, please call [place], [phone] (temporary number).      Follow-Up: At [facility], you and your health needs are our priority.  As part of our continuing mission to provide you with exceptional heart care, we have created designated Provider Care Teams.  These Care Teams include your primary Cardiologist (physician) and Advanced Practice Providers (APPs -  Physician Assistants and Nurse Practitioners) who all work together to provide you with the care you need, when you need it.  We recommend signing up for the patient portal called "MyChart".  Sign up information is provided on this After Visit Summary.  MyChart is used to connect with patients for Virtual Visits (Telemedicine).  Patients are able to view lab/test results, encounter notes, upcoming appointments, etc.  Non-urgent messages can be sent to your provider as well.   To learn more about what you can do with MyChart, go to [email or web address].    Your next appointment:   Follow up after Echo and CTA   The format for your next appointment:   In Person  Provider:   Kate Sable, MD   Other Instructions      Signed, Kate Sable, MD  02/17/2020 4:55 PM    [place]

## 2020-03-03 ENCOUNTER — Ambulatory Visit (INDEPENDENT_AMBULATORY_CARE_PROVIDER_SITE_OTHER): Payer: Medicare PPO

## 2020-03-03 ENCOUNTER — Other Ambulatory Visit: Payer: Self-pay

## 2020-03-03 DIAGNOSIS — R0600 Dyspnea, unspecified: Secondary | ICD-10-CM | POA: Diagnosis not present

## 2020-03-03 DIAGNOSIS — R079 Chest pain, unspecified: Secondary | ICD-10-CM

## 2020-03-03 DIAGNOSIS — R0609 Other forms of dyspnea: Secondary | ICD-10-CM

## 2020-03-04 LAB — ECHOCARDIOGRAM COMPLETE
Area-P 1/2: 3.23 cm2
S' Lateral: 2.5 cm

## 2020-03-05 ENCOUNTER — Telehealth: Payer: Self-pay | Admitting: *Deleted

## 2020-03-05 NOTE — Telephone Encounter (Signed)
-----   Message from Debbe Odea, MD sent at 03/04/2020  5:43 PM EST ----- Normal systolic function, impaired relaxation, aortic valve sclerosis.  No significant findings to suggest etiology of chest pain.

## 2020-03-05 NOTE — Telephone Encounter (Signed)
Results released to My Chart. No answer. Left message to call back.   

## 2020-03-11 ENCOUNTER — Other Ambulatory Visit: Payer: Self-pay

## 2020-03-11 ENCOUNTER — Other Ambulatory Visit: Payer: Medicare PPO

## 2020-03-11 ENCOUNTER — Other Ambulatory Visit (INDEPENDENT_AMBULATORY_CARE_PROVIDER_SITE_OTHER): Payer: Medicare PPO

## 2020-03-11 DIAGNOSIS — R7989 Other specified abnormal findings of blood chemistry: Secondary | ICD-10-CM | POA: Diagnosis not present

## 2020-03-12 ENCOUNTER — Other Ambulatory Visit: Payer: Medicare PPO

## 2020-03-12 LAB — TSH: TSH: 2.71 u[IU]/mL (ref 0.35–4.50)

## 2020-03-12 NOTE — Telephone Encounter (Signed)
Pt has reviewed results in My Chart.  

## 2020-03-24 ENCOUNTER — Telehealth (HOSPITAL_COMMUNITY): Payer: Self-pay | Admitting: Emergency Medicine

## 2020-03-24 ENCOUNTER — Other Ambulatory Visit (HOSPITAL_COMMUNITY): Payer: Self-pay | Admitting: *Deleted

## 2020-03-24 DIAGNOSIS — R072 Precordial pain: Secondary | ICD-10-CM

## 2020-03-24 NOTE — Telephone Encounter (Signed)
Reaching out to patient to offer assistance regarding upcoming cardiac imaging study; pt verbalizes understanding of appt date/time, parking situation and where to check in, pre-test NPO status and medications ordered, and verified current allergies; name and call back number provided for further questions should they arise Shanikia Kernodle RN Navigator Cardiac Imaging [place] [facility] [phone] office [phone] cell 

## 2020-03-24 NOTE — Telephone Encounter (Signed)
Reaching out to patient to offer assistance regarding upcoming cardiac imaging study; pt verbalizes understanding of appt date/time, parking situation and where to check in, pre-test NPO status and medications ordered, and verified current allergies; name and call back number provided for further questions should they arise Preciosa Bundrick RN Navigator [facility] [phone] office [phone] cell 

## 2020-03-26 ENCOUNTER — Ambulatory Visit
Admission: RE | Admit: 2020-03-26 | Discharge: 2020-03-26 | Disposition: A | Discharge status: Home / Self Care | Payer: Medicare PPO | Source: Ambulatory Visit | Attending: Cardiology | Admitting: Cardiology

## 2020-03-26 ENCOUNTER — Other Ambulatory Visit: Payer: Self-pay

## 2020-03-26 DIAGNOSIS — R072 Precordial pain: Secondary | ICD-10-CM | POA: Diagnosis not present

## 2020-03-26 LAB — POCT I-STAT CREATININE: Creatinine, Ser: 0.8 mg/dL (ref 0.44–1.00)

## 2020-03-26 MED ORDER — NITROGLYCERIN 0.4 MG SL SUBL
0.8000 mg | SUBLINGUAL_TABLET | Freq: Once | SUBLINGUAL | Status: AC
  Administered 2020-03-26: 11:00:00 0.8 mg via SUBLINGUAL

## 2020-03-26 MED ORDER — IOHEXOL 350 MG/ML SOLN
75.0000 mL | Freq: Once | INTRAVENOUS | Status: AC | PRN
  Administered 2020-03-26: 11:00:00 75 mL via INTRAVENOUS

## 2020-03-26 NOTE — Progress Notes (Signed)
Patient tolerated CT well. Drank coffee and water after.. Vital signs stable encourage to drink water throughout day.Reasons explained and verbalized understanding. Ambulated steady gait.  

## 2020-04-09 ENCOUNTER — Encounter: Payer: Self-pay | Admitting: Cardiology

## 2020-04-09 ENCOUNTER — Ambulatory Visit: Payer: Medicare PPO | Admitting: Cardiology

## 2020-04-09 ENCOUNTER — Other Ambulatory Visit: Payer: Self-pay

## 2020-04-09 VITALS — BP 112/80 | HR 73 | Ht 60.0 in | Wt 137.0 lb

## 2020-04-09 DIAGNOSIS — R072 Precordial pain: Secondary | ICD-10-CM | POA: Diagnosis not present

## 2020-04-09 DIAGNOSIS — E7800 Pure hypercholesterolemia, unspecified: Secondary | ICD-10-CM

## 2020-04-09 NOTE — Progress Notes (Signed)
Cardiology Office Note:    Date:  04/09/2020   ID:  Jessica Peck, DOB 1950-08-04, MRN [id number]  PCP:  Dale Weir, MD  [facility] Cardiologist:  Debbe Odea, MD  [facility] Electrophysiologist:  None   Referring MD: Dale [place], MD   Chief Complaint  Patient presents with  . Follow-up    Cardiac CT & Echo. Medications reviewed by the patient verbally. "doing well."     History of Present Illness:    Jessica Peck is a 70 y.o. female with a hx of hyperlipidemia, OSA on CPAP who presents for follow-up.  Last seen due to shortness of breath and chest tightness sometimes associated with exertion.  Echocardiogram and coronary CTA ordered to evaluate cardiac function and presence of CAD.  She states her symptoms have largely resolved.  Plans to follow-up with sleep specialist for OSA evaluation and management.  Prior notes.   Denies personal history of heart disease, history of CAD in her brother with multiple stents in his 40s.  Past Medical History:  Diagnosis Date  . Chronic constipation   . Esophagitis   . Gastritis    EGD (hiatal hernia)  . GERD (gastroesophageal reflux disease)   . Hypercholesterolemia   . Sleep apnea    CPAP  . Vitamin D deficiency     Past Surgical History:  Procedure Laterality Date  . APPENDECTOMY  1976    Current Medications: Current Meds  Medication Sig  . Cyanocobalamin 1000 MCG TBCR Take by mouth.  . Melatonin 3 MG TABS Take 0.5 tablets by mouth at bedtime.  . meloxicam (MOBIC) 7.5 MG tablet Take 7.5 mg by mouth daily as needed.   . pantoprazole (PROTONIX) 20 MG tablet ONE TO TWO TABLETS EVERY DAY  . sertraline (ZOLOFT) 50 MG tablet TAKE 1 AND 1/2 TABLET PER DAY  . VITAMIN D, CHOLECALCIFEROL, PO Take 4,000 Units by mouth daily.      Allergies:   Penicillins   Social History   Socioeconomic History  . Marital status: Married    Spouse name: Not on file  . Number of children: 2  . Years of education:  Not on file  . Highest education level: Not on file  Occupational History  . Not on file  Tobacco Use  . Smoking status: Never Smoker  . Smokeless tobacco: Never Used  Substance and Sexual Activity  . Alcohol use: Yes    Alcohol/week: 0.0 standard drinks    Comment: occasional  . Drug use: No  . Sexual activity: Not on file  Other Topics Concern  . Not on file  Social History Narrative  . Not on file   Social Determinants of Health   Financial Resource Strain: Not on file  Food Insecurity: Not on file  Transportation Needs: Not on file  Physical Activity: Not on file  Stress: Not on file  Social Connections: Not on file     Family History: The patient's family history includes Breast cancer in her sister; Breast cancer (age of onset: 75) in her maternal uncle and paternal aunt; COPD in her mother; Colon cancer in her father and mother; Heart disease in her mother.  ROS:   Please see the history of present illness.     All other systems reviewed and are negative.  EKGs/Labs/Other Studies Reviewed:    The following studies were reviewed today:   EKG:  EKG is not ordered today.    Prior ECG 01/29/2020 reviewed by myself shows normal sinus  rhythm, old inferior infarct.  Recent Labs: 01/27/2020: ALT 17; BUN 20; Hemoglobin 13.8; Platelets 268.0; Potassium 4.5; Sodium 140 03/11/2020: TSH 2.71 03/26/2020: Creatinine, Ser 0.80  Recent Lipid Panel    Component Value Date/Time   CHOL 250 (H) 01/27/2020 0824   TRIG 193.0 (H) 01/27/2020 0824   HDL 61.60 01/27/2020 0824   CHOLHDL 4 01/27/2020 0824   VLDL 38.6 01/27/2020 0824   LDLCALC 150 (H) 01/27/2020 0824   LDLDIRECT 146.0 09/23/2019 0839     Risk Assessment/Calculations:      Physical Exam:    VS:  BP 112/80 (BP Location: Left Arm, Patient Position: Sitting, Cuff Size: Normal)   Pulse 73   Ht 5' (1.524 m)   Wt 137 lb (62.1 kg)   LMP 06/04/2000   SpO2 98%   BMI 26.76 kg/m     Wt Readings from Last 3  Encounters:  04/09/20 137 lb (62.1 kg)  02/17/20 138 lb (62.6 kg)  02/05/20 138 lb 12.8 oz (63 kg)     GEN:  Well nourished, well developed in no acute distress HEENT: Normal NECK: No JVD; No carotid bruits LYMPHATICS: No lymphadenopathy CARDIAC: RRR, no murmurs, rubs, gallops RESPIRATORY:  Clear to auscultation without rales, wheezing or rhonchi  ABDOMEN: Soft, non-tender, non-distended MUSCULOSKELETAL:  No edema; No deformity  SKIN: Warm and dry NEUROLOGIC:  Alert and oriented x 3 PSYCHIATRIC:  Normal affect   ASSESSMENT:    1. Precordial pain   2. Pure hypercholesterolemia    PLAN:    In order of problems listed above:  1. Chest pain, symptoms currently resolved.  Echo with preserved ejection fraction, coronary CTA with calcium score of 0, no evidence for CAD.  Patient made aware of results and reassured..  2. Hyperlipidemia, 10-year ASCVD risk 7.0%.  Not in statin benefit group, low-cholesterol diet advised.  Follow-up as needed  Shared Decision Making/Informed Consent       Medication Adjustments/Labs and Tests Ordered: Current medicines are reviewed at length with the patient today.  Concerns regarding medicines are outlined above.  Orders Placed This Encounter  Procedures  . EKG 12-Lead   No orders of the defined types were placed in this encounter.   Patient Instructions  Medication Instructions:  Your physician recommends that you continue on your current medications as directed. Please refer to the Current Medication list given to you today.  *If you need a refill on your cardiac medications before your next appointment, please call your pharmacy*   Lab Work: None Ordered If you have labs (blood work) drawn today and your tests are completely normal, you will receive your results only by: Marland Kitchen MyChart Message (if you have MyChart) OR . A paper copy in the mail If you have any lab test that is abnormal or we need to change your treatment, we will call  you to review the results.   Testing/Procedures: None Ordered   Follow-Up: At [facility], you and your health needs are our priority.  As part of our continuing mission to provide you with exceptional heart care, we have created designated Provider Care Teams.  These Care Teams include your primary Cardiologist (physician) and Advanced Practice Providers (APPs -  Physician Assistants and Nurse Practitioners) who all work together to provide you with the care you need, when you need it.  We recommend signing up for the patient portal called "MyChart".  Sign up information is provided on this After Visit Summary.  MyChart is used to connect with patients  for Virtual Visits (Telemedicine).  Patients are able to view lab/test results, encounter notes, upcoming appointments, etc.  Non-urgent messages can be sent to your provider as well.   To learn more about what you can do with MyChart, go to [email or web address].    Your next appointment:   Follow up as needed   The format for your next appointment:   In Person  Provider:   Debbe Odea, MD   Other Instructions       Signed, Debbe Odea, MD  04/09/2020 1:19 PM    [facility]

## 2020-04-09 NOTE — Patient Instructions (Signed)

## 2020-05-01 ENCOUNTER — Telehealth (INDEPENDENT_AMBULATORY_CARE_PROVIDER_SITE_OTHER): Payer: Medicare PPO | Admitting: Internal Medicine

## 2020-05-01 ENCOUNTER — Encounter: Payer: Self-pay | Admitting: Internal Medicine

## 2020-05-01 DIAGNOSIS — E7800 Pure hypercholesterolemia, unspecified: Secondary | ICD-10-CM | POA: Diagnosis not present

## 2020-05-01 DIAGNOSIS — R739 Hyperglycemia, unspecified: Secondary | ICD-10-CM | POA: Diagnosis not present

## 2020-05-01 DIAGNOSIS — G4733 Obstructive sleep apnea (adult) (pediatric): Secondary | ICD-10-CM

## 2020-05-01 DIAGNOSIS — K219 Gastro-esophageal reflux disease without esophagitis: Secondary | ICD-10-CM

## 2020-05-01 DIAGNOSIS — F439 Reaction to severe stress, unspecified: Secondary | ICD-10-CM

## 2020-05-01 NOTE — Progress Notes (Signed)
Patient ID: Jessica Peck, female   DOB: [birth year], 70 y.o.   MRN: [id number]  Virtual Visit via video Note  This visit type was conducted due to national recommendations for restrictions regarding the COVID-19 pandemic (e.g. social distancing).  This format is felt to be most appropriate for this patient at this time.  All issues noted in this document were discussed and addressed.  No physical exam was performed (except for noted visual exam findings with Video Visits).   I connected with Jessica Peck by a video enabled telemedicine application and verified that I am speaking with the correct person using two identifiers. Location patient: home Location provider: work  Persons participating in the virtual visit: patient, provider  The limitations, risks, security and privacy concerns of performing an evaluation and management service by video and the availability of in person appointments have been discussed.  It has also been discussed with the patient that there may be a patient responsible charge related to this service. The patient expressed understanding and agreed to proceed.   Reason for visit: scheduled follow up.   HPI: Follow up regarding her cholesterol and blood sugar.  Recently evaluated by cardiology.  Calcium score 0.  ECHO - EF normal.  Tries to stay active.  Discussed diet and exercise.  Breathing stable.  Previous sleep study - sleep apnea.  Needs referral back to pulmonary to arrange cpap.  Was going to try oral apparatus, but insurance does not cover.  No acid reflux reported.  No abdominal pain.  Bowels stable.  Takes meloxicam prn - for her hip.  Injection helped shoulder.  Increased stress.  Discussed.  Overall handling things relatively well.    ROS: See pertinent positives and negatives per HPI.  Past Medical History:  Diagnosis Date  . Chronic constipation   . Esophagitis   . Gastritis    EGD (hiatal hernia)  . GERD (gastroesophageal reflux disease)   .  Hypercholesterolemia   . Sleep apnea    CPAP  . Vitamin D deficiency     Past Surgical History:  Procedure Laterality Date  . APPENDECTOMY  1976    Family History  Problem Relation Age of Onset  . Colon cancer Father   . Colon cancer Mother   . COPD Mother   . Heart disease Mother   . Breast cancer Sister        died age 51  . Breast cancer Maternal Uncle 60  . Breast cancer Paternal Aunt 61    SOCIAL HX: reviewed.    Current Outpatient Medications:  .  Cyanocobalamin 1000 MCG TBCR, Take by mouth., Disp: , Rfl:  .  Melatonin 3 MG TABS, Take 0.5 tablets by mouth at bedtime., Disp: , Rfl:  .  meloxicam (MOBIC) 7.5 MG tablet, Take 7.5 mg by mouth daily as needed. , Disp: , Rfl:  .  pantoprazole (PROTONIX) 20 MG tablet, ONE TO TWO TABLETS EVERY DAY, Disp: 180 tablet, Rfl: 1 .  sertraline (ZOLOFT) 50 MG tablet, TAKE 1 AND 1/2 TABLET PER DAY, Disp: 135 tablet, Rfl: 1 .  VITAMIN D, CHOLECALCIFEROL, PO, Take 4,000 Units by mouth daily. , Disp: , Rfl:   EXAM:  GENERAL: alert, oriented, appears well and in no acute distress  HEENT: atraumatic, conjunttiva clear, no obvious abnormalities on inspection of external nose and ears  NECK: normal movements of the head and neck  LUNGS: on inspection no signs of respiratory distress, breathing rate appears normal, no obvious gross SOB, gasping  or wheezing  CV: no obvious cyanosis  PSYCH/NEURO: pleasant and cooperative, no obvious depression or anxiety, speech and thought processing grossly intact  ASSESSMENT AND PLAN:  Discussed the following assessment and plan:  Problem List Items Addressed This Visit    GERD (gastroesophageal reflux disease)    Upper symptoms controlled on protonix.        Hyperglycemia    Low carb diet and exercise.  Follow met b and a1c.        Relevant Orders   Hemoglobin A1c   Obstructive sleep apnea    Has known sleep apnea.  Did not tolerate cpap previously.  Insurance will not cover mouth piece.   Refer back to pulmonary to set up cpap.       Relevant Orders   Ambulatory referral to Pulmonology   Pure hypercholesterolemia    Recent calcium score 0.  Low cholesterol diet and exercise.  Follow lipid panel.       Relevant Orders   CBC with Differential/Platelet   Hepatic function panel   Lipid panel   TSH   Basic metabolic panel   Stress    Increased stress. Discussed.  On zoloft.  Does not feel needs anything more at this time.  Follow.            I discussed the assessment and treatment plan with the patient. The patient was provided an opportunity to ask questions and all were answered. The patient agreed with the plan and demonstrated an understanding of the instructions.   The patient was advised to call back or seek an in-person evaluation if the symptoms worsen or if the condition fails to improve as anticipated.   Einar Pheasant, MD

## 2020-05-02 ENCOUNTER — Encounter: Payer: Self-pay | Admitting: Internal Medicine

## 2020-05-02 NOTE — Assessment & Plan Note (Signed)
Upper symptoms controlled on protonix.  

## 2020-05-02 NOTE — Assessment & Plan Note (Signed)
Low carb diet and exercise.  Follow met b and a1c.   

## 2020-05-02 NOTE — Assessment & Plan Note (Signed)
Recent calcium score 0.  Low cholesterol diet and exercise.  Follow lipid panel.

## 2020-05-02 NOTE — Assessment & Plan Note (Signed)
Increased stress. Discussed.  On zoloft.  Does not feel needs anything more at this time.  Follow.

## 2020-05-02 NOTE — Assessment & Plan Note (Signed)
Has known sleep apnea.  Did not tolerate cpap previously.  Insurance will not cover mouth piece.  Refer back to pulmonary to set up cpap.

## 2020-06-03 ENCOUNTER — Telehealth: Payer: Self-pay

## 2020-06-03 NOTE — Telephone Encounter (Signed)
Pt called and states that she needs to know who is supposed to do her cpap machine fitting. She states that someone had contacted her not long ago about but she had to put it off. She does not remember who called her about it. Please advise

## 2020-06-04 NOTE — Telephone Encounter (Signed)
LMTCB

## 2020-06-04 NOTE — Telephone Encounter (Signed)
Pt called returning your call 

## 2020-06-04 NOTE — Telephone Encounter (Signed)
Information given to her for [facility]

## 2020-06-08 ENCOUNTER — Telehealth: Payer: Self-pay | Admitting: Internal Medicine

## 2020-06-08 DIAGNOSIS — G4733 Obstructive sleep apnea (adult) (pediatric): Secondary | ICD-10-CM

## 2020-06-08 NOTE — Telephone Encounter (Signed)
Please order new DME (lives in [place]), new CPAP auto 5-15, mask of choice, humidifier, supplies, AirView/ card  Please have her make appointment for ov 31-90 days after she gets machine, as required by insurance. Explain she will need to talk with the DME about how long she may have to wait, due to supply chain problems.

## 2020-06-08 NOTE — Telephone Encounter (Signed)
Called and spoke with pt and she stated that she was referred by her PCP and was seen by CY on 02/05/20.  She stated that she is wanting to go ahead and try the cpap but was not sure if she needed to be seen again by CY or if she can request the cpap order to be sent in.  Pt does live in [place].  She stated that her insurance does not cover the oral appliance.  CY please advise.   She does not have any pending appts with you.  Thanks

## 2020-06-09 NOTE — Telephone Encounter (Signed)
Lmtcb for pt.  

## 2020-06-09 NOTE — Telephone Encounter (Signed)
Called and spoke with patient about wanting to get set up with CPAP. Advised her that I would place order to get her set up with DME in [place] and that they would reach out to her once they received order. Also let her know that there is a backorder on machines due to recall so we are not sure when she will get machine but that they would notify her. Advised patient that once she physically got machine to call the office and set up a follow up within 31-90 days with Dr. Maple Hudson. Patient repeated everything back to me for confirmation. Order has been placed. Nothing further needed at this time.

## 2020-06-09 NOTE — Telephone Encounter (Signed)
Patient is returning phone call. Patient phone number is [phone].

## 2020-06-21 ENCOUNTER — Other Ambulatory Visit: Payer: Self-pay | Admitting: Internal Medicine

## 2020-07-01 ENCOUNTER — Ambulatory Visit (INDEPENDENT_AMBULATORY_CARE_PROVIDER_SITE_OTHER): Payer: Medicare PPO

## 2020-07-01 VITALS — Ht 60.0 in | Wt 135.0 lb

## 2020-07-01 DIAGNOSIS — Z0000 Encounter for general adult medical examination without abnormal findings: Secondary | ICD-10-CM

## 2020-07-01 NOTE — Progress Notes (Signed)
Subjective:   Jessica Peck is a 70 y.o. female who presents for Medicare Annual (Subsequent) preventive examination.  Review of Systems    No ROS.  Medicare Wellness Virtual Visit.    Cardiac Risk Factors include: advanced age (>59men, >35 women)     Objective:    Today's Vitals   07/01/20 1231  Weight: 135 lb (61.2 kg)  Height: 5' (1.524 m)   Body mass index is 26.37 kg/m.  Advanced Directives 07/01/2020 07/01/2019  Does Patient Have a Medical Advance Directive? No Yes  Type of Advance Directive - Healthcare Power of [place];Living will  Does patient want to make changes to medical advance directive? - No - Patient declined  Copy of Healthcare Power of Attorney in Chart? - No - copy requested  Would patient like information on creating a medical advance directive? No - Patient declined -    Current Medications (verified) Outpatient Encounter Medications as of 07/01/2020  Medication Sig  . Cyanocobalamin 1000 MCG TBCR Take by mouth.  . Melatonin 3 MG TABS Take 0.5 tablets by mouth at bedtime.  . meloxicam (MOBIC) 7.5 MG tablet Take 7.5 mg by mouth daily as needed.   . pantoprazole (PROTONIX) 20 MG tablet ONE TO TWO TABLETS EVERY DAY  . sertraline (ZOLOFT) 50 MG tablet TAKE 1 AND 1/2 TABLET PER DAY  . VITAMIN D, CHOLECALCIFEROL, PO Take 4,000 Units by mouth daily.    No facility-administered encounter medications on file as of 07/01/2020.    Allergies (verified) Penicillins   History: Past Medical History:  Diagnosis Date  . Chronic constipation   . Esophagitis   . Gastritis    EGD (hiatal hernia)  . GERD (gastroesophageal reflux disease)   . Hypercholesterolemia   . Sleep apnea    CPAP  . Vitamin D deficiency    Past Surgical History:  Procedure Laterality Date  . APPENDECTOMY  1976   Family History  Problem Relation Age of Onset  . Colon cancer Father   . Colon cancer Mother   . COPD Mother   . Heart disease Mother   . Breast cancer Sister         died age 22  . Breast cancer Maternal Uncle 60  . Breast cancer Paternal Aunt 38   Social History   Socioeconomic History  . Marital status: Married    Spouse name: Not on file  . Number of children: 2  . Years of education: Not on file  . Highest education level: Not on file  Occupational History  . Not on file  Tobacco Use  . Smoking status: Never Smoker  . Smokeless tobacco: Never Used  Substance and Sexual Activity  . Alcohol use: Yes    Alcohol/week: 0.0 standard drinks    Comment: occasional  . Drug use: No  . Sexual activity: Not on file  Other Topics Concern  . Not on file  Social History Narrative  . Not on file   Social Determinants of Health   Financial Resource Strain: Low Risk   . Difficulty of Paying Living Expenses: Not hard at all  Food Insecurity: No Food Insecurity  . Worried About Programme researcher, broadcasting/film/video in the Last Year: Never true  . Ran Out of Food in the Last Year: Never true  Transportation Needs: No Transportation Needs  . Lack of Transportation (Medical): No  . Lack of Transportation (Non-Medical): No  Physical Activity: Unknown  . Days of Exercise per Week: 0 days  .  Minutes of Exercise per Session: Not on file  Stress: No Stress Concern Present  . Feeling of Stress : Not at all  Social Connections: Unknown  . Frequency of Communication with Friends and Family: Not on file  . Frequency of Social Gatherings with Friends and Family: Not on file  . Attends Religious Services: Not on file  . Active Member of Clubs or Organizations: Not on file  . Attends Banker Meetings: Not on file  . Marital Status: Married    Tobacco Counseling Counseling given: Not Answered   Clinical Intake:  Pre-visit preparation completed: Yes        Diabetes: No  How often do you need to have someone help you when you read instructions, pamphlets, or other written materials from your doctor or pharmacy?: 1 - Never   Interpreter Needed?:  No      Activities of Daily Living In your present state of health, do you have any difficulty performing the following activities: 07/01/2020  Hearing? N  Vision? N  Difficulty concentrating or making decisions? N  Walking or climbing stairs? N  Dressing or bathing? N  Doing errands, shopping? N  Preparing Food and eating ? N  Using the Toilet? N  In the past six months, have you accidently leaked urine? N  Do you have problems with loss of bowel control? N  Managing your Medications? N  Managing your Finances? N  Housekeeping or managing your Housekeeping? N  Some recent data might be hidden    Patient Care Team: Dale Iberia, MD as PCP - General (Internal Medicine) Debbe Odea, MD as PCP - Cardiology (Cardiology)  Indicate any recent Medical Services you may have received from other than [facility] providers in the past year (date may be approximate).     Assessment:   This is a routine wellness examination for Jessica Peck.  I connected with Jacaria today by telephone and verified that I am speaking with the correct person using two identifiers. Location patient: home Location provider: work Persons participating in the virtual visit: patient, Engineer, civil (consulting).    I discussed the limitations, risks, security and privacy concerns of performing an evaluation and management service by telephone and the availability of in person appointments. The patient expressed understanding and verbally consented to this telephonic visit.    Interactive audio and video telecommunications were attempted between this provider and patient, however failed, due to patient having technical difficulties OR patient did not have access to video capability.  We continued and completed visit with audio only.  Some vital signs may be absent or patient reported.   Hearing/Vision screen  Hearing Screening   125Hz  250Hz  500Hz  1000Hz  2000Hz  3000Hz  4000Hz  6000Hz  8000Hz   Right ear:           Left ear:            Comments: Patient is able to hear conversational tones without difficulty.  No issues reported.  Vision Screening Comments: Followed by Dr. Wears corrective lenses Visual acuity not assessed, virtual visit.  They have seen their ophthalmologist in the last 12 months.     Dietary issues and exercise activities discussed: Current Exercise Habits: The patient does not participate in regular exercise at present, Intensity: Mild  Regular diet Good water intake  Goals    . I would like to increase water intake, eat low carb diet and lose 10-15lbs      Depression Screen [facility] 2/9 Scores 07/01/2020 07/01/2019 12/17/2018 12/26/2016 06/11/2015 12/11/2014  PHQ - 2 Score 0 0 0 0 0 0  PHQ- 9 Score - - - 0 - -    Fall Risk Fall Risk  07/01/2020 07/01/2019 12/17/2018 12/26/2016 12/11/2014  Falls in the past year? 0 0 0 No No  Number falls in past yr: 0 - - - -  Injury with Fall? 0 - - - -  Follow up Falls evaluation completed Falls evaluation completed;Falls prevention discussed Falls evaluation completed - -    FALL RISK PREVENTION PERTAINING TO THE HOME: Handrails in use when climbing stairs? Yes Home free of loose throw rugs in walkways, pet beds, electrical cords, etc? Yes  Adequate lighting in your home to reduce risk of falls? Yes   ASSISTIVE DEVICES UTILIZED TO PREVENT FALLS: Use of a cane, walker or w/c? No   TIMED UP AND GO: Was the test performed? No. Virtual visit.   Cognitive Function:  Patient is alert and oriented x3.  Denies difficulty focusing, making decisions, memory loss.  Enjoys reading and substituting in an educational setting for brain health.  MMSE/6CIT deferred. Normal by direct communication/observation.    6CIT Screen 07/01/2019  What Year? 0 points  What month? 0 points  What time? 0 points    Immunizations Immunization History  Administered Date(s) Administered  . Influenza Split 01/02/2013, 01/14/2014, 01/20/2016  . Influenza, High Dose Seasonal PF  01/16/2020  . Influenza,inj,Quad PF,6+ Mos 12/26/2016  . Influenza-Unspecified 01/27/2018  . PFIZER(Purple Top)SARS-COV-2 Vaccination 04/01/2019, 04/22/2019, 02/01/2020  . Pneumococcal Conjugate-13 04/10/2018  . Td 09/11/2011  . Zoster 01/20/2016  . Zoster Recombinat (Shingrix) 10/18/2018, 01/01/2019   PNA vaccine- Deferred. May receive this vaccine in office setting, at local pharmacy or Health Dept. Aware to provide a copy of the vaccination record if obtained from local pharmacy or Health Dept.   Health Maintenance Health Maintenance  Topic Date Due  . PNA vac Low Risk Adult (2 of 2 - PPSV23) 08/06/2020 (Originally 04/11/2019)  . INFLUENZA VACCINE  10/26/2020  . TETANUS/TDAP  09/10/2021  . MAMMOGRAM  11/20/2021  . COLONOSCOPY (Pts 45-46yrs Insurance coverage will need to be confirmed)  12/12/2029  . DEXA SCAN  Completed  . COVID-19 Vaccine  Completed  . Hepatitis C Screening  Completed  . HPV VACCINES  Aged Out   Colorectal cancer screening: Type of screening: Colonoscopy. Completed 12/13/19. Repeat every 10 years  Mammogram status: Completed 11/21/19. Repeat every year  Lung Cancer Screening: (Low Dose CT Chest recommended if Age 17-80 years, 30 pack-year currently smoking OR have quit w/in 15years.) does not qualify.   Vision Screening: Recommended annual ophthalmology exams for early detection of glaucoma and other disorders of the eye. Is the patient up to date with their annual eye exam?  Yes   Dental Screening: Recommended annual dental exams for proper oral hygiene  Community Resource Referral / Chronic Care Management: CRR required this visit?  No   CCM required this visit?  No      Plan:   Keep all routine maintenance appointments.   Next scheduled fasting lab 07/30/20 @ 9:15  3 month follow up 08/06/20 Conditions/risks identified: shoulder pain worsening. Considering PT. Discuss with pcp at upcoming visit.   I have personally reviewed and noted the following  in the patient's chart:   . Medical and social history . Use of alcohol, tobacco or illicit drugs  . Current medications and supplements . Functional ability and status . Nutritional status . Physical activity . Advanced directives . List of other  physicians . Hospitalizations, surgeries, and ER visits in previous 12 months . Vitals . Screenings to include cognitive, depression, and falls . Referrals and appointments  In addition, I have reviewed and discussed with patient certain preventive protocols, quality metrics, and best practice recommendations. A written personalized care plan for preventive services as well as general preventive health recommendations were provided to patient via mychart.     Ashok PallOBrien-Blaney, Raissa Dam L, LPN   4/0/98114/08/2020

## 2020-07-01 NOTE — Patient Instructions (Addendum)
Jessica Peck , Thank you for taking time to come for your Medicare Wellness Visit. I appreciate your ongoing commitment to your health goals. Please review the following plan we discussed and let me know if I can assist you in the future.   These are the goals we discussed: Goals    . I would like to increase water intake, eat low carb diet and lose 10-15lbs       This is a list of the screening recommended for you and due dates:  Health Maintenance  Topic Date Due  . Pneumonia vaccines (2 of 2 - PPSV23) 08/06/2020*  . Flu Shot  10/26/2020  . Tetanus Vaccine  09/10/2021  . Mammogram  11/20/2021  . Colon Cancer Screening  12/12/2029  . DEXA scan (bone density measurement)  Completed  . COVID-19 Vaccine  Completed  .  Hepatitis C: One time screening is recommended by Center for Disease Control  (CDC) for  adults born from [birth year] through 1964.   Completed  . HPV Vaccine  Aged Out  *Topic was postponed. The date shown is not the original due date.    Immunizations Immunization History  Administered Date(s) Administered  . Influenza Split 01/02/2013, 01/14/2014, 01/20/2016  . Influenza, High Dose Seasonal PF 01/16/2020  . Influenza,inj,Quad PF,6+ Mos 12/26/2016  . Influenza-Unspecified 01/27/2018  . PFIZER(Purple Top)SARS-COV-2 Vaccination 04/01/2019, 04/22/2019, 02/01/2020  . Pneumococcal Conjugate-13 04/10/2018  . Td 09/11/2011  . Zoster 01/20/2016  . Zoster Recombinat (Shingrix) 10/18/2018, 01/01/2019   Advanced directives: not yet completed  Conditions/risks identified: shoulder pain worsening. Considering PT. Discuss with pcp at upcoming visit.   Follow up in one year for your annual wellness visit.   Preventive Care 13 Years and Older, Female Preventive care refers to lifestyle choices and visits with your health care provider that can promote health and wellness. What does preventive care include?  A yearly physical exam. This is also called an annual well  check.  Dental exams once or twice a year.  Routine eye exams. Ask your health care provider how often you should have your eyes checked.  Personal lifestyle choices, including:  Daily care of your teeth and gums.  Regular physical activity.  Eating a healthy diet.  Avoiding tobacco and drug use.  Limiting alcohol use.  Practicing safe sex.  Taking low-dose aspirin every day.  Taking vitamin and mineral supplements as recommended by your health care provider. What happens during an annual well check? The services and screenings done by your health care provider during your annual well check will depend on your age, overall health, lifestyle risk factors, and family history of disease. Counseling  Your health care provider may ask you questions about your:  Alcohol use.  Tobacco use.  Drug use.  Emotional well-being.  Home and relationship well-being.  Sexual activity.  Eating habits.  History of falls.  Memory and ability to understand (cognition).  Work and work Astronomer.  Reproductive health. Screening  You may have the following tests or measurements:  Height, weight, and BMI.  Blood pressure.  Lipid and cholesterol levels. These may be checked every 5 years, or more frequently if you are over 24 years old.  Skin check.  Lung cancer screening. You may have this screening every year starting at age 52 if you have a 30-pack-year history of smoking and currently smoke or have quit within the past 15 years.  Fecal occult blood test (FOBT) of the stool. You may have this test every year  starting at age 7.  Flexible sigmoidoscopy or colonoscopy. You may have a sigmoidoscopy every 5 years or a colonoscopy every 10 years starting at age 108.  Hepatitis C blood test.  Hepatitis B blood test.  Sexually transmitted disease (STD) testing.  Diabetes screening. This is done by checking your blood sugar (glucose) after you have not eaten for a while  (fasting). You may have this done every 1-3 years.  Bone density scan. This is done to screen for osteoporosis. You may have this done starting at age 54.  Mammogram. This may be done every 1-2 years. Talk to your health care provider about how often you should have regular mammograms. Talk with your health care provider about your test results, treatment options, and if necessary, the need for more tests. Vaccines  Your health care provider may recommend certain vaccines, such as:  Influenza vaccine. This is recommended every year.  Tetanus, diphtheria, and acellular pertussis (Tdap, Td) vaccine. You may need a Td booster every 10 years.  Zoster vaccine. You may need this after age 81.  Pneumococcal 13-valent conjugate (PCV13) vaccine. One dose is recommended after age 70.  Pneumococcal polysaccharide (PPSV23) vaccine. One dose is recommended after age 29. Talk to your health care provider about which screenings and vaccines you need and how often you need them. This information is not intended to replace advice given to you by your health care provider. Make sure you discuss any questions you have with your health care provider. Document Released: 04/10/2015 Document Revised: 12/02/2015 Document Reviewed: 01/13/2015 Elsevier Interactive Patient Education  2017 [place] Prevention in the Home Falls can cause injuries. They can happen to people of all ages. There are many things you can do to make your home safe and to help prevent falls. What can I do on the outside of my home?  Regularly fix the edges of walkways and driveways and fix any cracks.  Remove anything that might make you trip as you walk through a door, such as a raised step or threshold.  Trim any bushes or trees on the path to your home.  Use bright outdoor lighting.  Clear any walking paths of anything that might make someone trip, such as rocks or tools.  Regularly check to see if handrails are loose  or broken. Make sure that both sides of any steps have handrails.  Any raised decks and porches should have guardrails on the edges.  Have any leaves, snow, or ice cleared regularly.  Use sand or salt on walking paths during winter.  Clean up any spills in your garage right away. This includes oil or grease spills. What can I do in the bathroom?  Use night lights.  Install grab bars by the toilet and in the tub and shower. Do not use towel bars as grab bars.  Use non-skid mats or decals in the tub or shower.  If you need to sit down in the shower, use a plastic, non-slip stool.  Keep the floor dry. Clean up any water that spills on the floor as soon as it happens.  Remove soap buildup in the tub or shower regularly.  Attach bath mats securely with double-sided non-slip rug tape.  Do not have throw rugs and other things on the floor that can make you trip. What can I do in the bedroom?  Use night lights.  Make sure that you have a light by your bed that is easy to reach.  Do not  use any sheets or blankets that are too big for your bed. They should not hang down onto the floor.  Have a firm chair that has side arms. You can use this for support while you get dressed.  Do not have throw rugs and other things on the floor that can make you trip. What can I do in the kitchen?  Clean up any spills right away.  Avoid walking on wet floors.  Keep items that you use a lot in easy-to-reach places.  If you need to reach something above you, use a strong step stool that has a grab bar.  Keep electrical cords out of the way.  Do not use floor polish or wax that makes floors slippery. If you must use wax, use non-skid floor wax.  Do not have throw rugs and other things on the floor that can make you trip. What can I do with my stairs?  Do not leave any items on the stairs.  Make sure that there are handrails on both sides of the stairs and use them. Fix handrails that are  broken or loose. Make sure that handrails are as long as the stairways.  Check any carpeting to make sure that it is firmly attached to the stairs. Fix any carpet that is loose or worn.  Avoid having throw rugs at the top or bottom of the stairs. If you do have throw rugs, attach them to the floor with carpet tape.  Make sure that you have a light switch at the top of the stairs and the bottom of the stairs. If you do not have them, ask someone to add them for you. What else can I do to help prevent falls?  Wear shoes that:  Do not have high heels.  Have rubber bottoms.  Are comfortable and fit you well.  Are closed at the toe. Do not wear sandals.  If you use a stepladder:  Make sure that it is fully opened. Do not climb a closed stepladder.  Make sure that both sides of the stepladder are locked into place.  Ask someone to hold it for you, if possible.  Clearly mark and make sure that you can see:  Any grab bars or handrails.  First and last steps.  Where the edge of each step is.  Use tools that help you move around (mobility aids) if they are needed. These include:  Canes.  Walkers.  Scooters.  Crutches.  Turn on the lights when you go into a dark area. Replace any light bulbs as soon as they burn out.  Set up your furniture so you have a clear path. Avoid moving your furniture around.  If any of your floors are uneven, fix them.  If there are any pets around you, be aware of where they are.  Review your medicines with your doctor. Some medicines can make you feel dizzy. This can increase your chance of falling. Ask your doctor what other things that you can do to help prevent falls. This information is not intended to replace advice given to you by your health care provider. Make sure you discuss any questions you have with your health care provider. Document Released: 01/08/2009 Document Revised: 08/20/2015 Document Reviewed: 04/18/2014 Elsevier  Interactive Patient Education  2017 [employer].

## 2020-07-06 DIAGNOSIS — G4733 Obstructive sleep apnea (adult) (pediatric): Secondary | ICD-10-CM | POA: Diagnosis not present

## 2020-07-30 ENCOUNTER — Other Ambulatory Visit: Payer: Self-pay

## 2020-07-30 ENCOUNTER — Other Ambulatory Visit (INDEPENDENT_AMBULATORY_CARE_PROVIDER_SITE_OTHER): Payer: Medicare PPO

## 2020-07-30 DIAGNOSIS — R739 Hyperglycemia, unspecified: Secondary | ICD-10-CM

## 2020-07-30 DIAGNOSIS — E7800 Pure hypercholesterolemia, unspecified: Secondary | ICD-10-CM | POA: Diagnosis not present

## 2020-07-30 LAB — CBC WITH DIFFERENTIAL/PLATELET
Basophils Absolute: 0 10*3/uL (ref 0.0–0.1)
Basophils Relative: 0.9 % (ref 0.0–3.0)
Eosinophils Absolute: 0.1 10*3/uL (ref 0.0–0.7)
Eosinophils Relative: 3.8 % (ref 0.0–5.0)
HCT: 38.8 % (ref 36.0–46.0)
Hemoglobin: 13 g/dL (ref 12.0–15.0)
Lymphocytes Relative: 37.7 % (ref 12.0–46.0)
Lymphs Abs: 1.5 10*3/uL (ref 0.7–4.0)
MCHC: 33.6 g/dL (ref 30.0–36.0)
MCV: 88.4 fl (ref 78.0–100.0)
Monocytes Absolute: 0.4 10*3/uL (ref 0.1–1.0)
Monocytes Relative: 9.3 % (ref 3.0–12.0)
Neutro Abs: 1.9 10*3/uL (ref 1.4–7.7)
Neutrophils Relative %: 48.3 % (ref 43.0–77.0)
Platelets: 228 10*3/uL (ref 150.0–400.0)
RBC: 4.39 Mil/uL (ref 3.87–5.11)
RDW: 13.9 % (ref 11.5–15.5)
WBC: 3.9 10*3/uL — ABNORMAL LOW (ref 4.0–10.5)

## 2020-07-30 LAB — LIPID PANEL
Cholesterol: 238 mg/dL — ABNORMAL HIGH (ref 0–200)
HDL: 57.5 mg/dL (ref >39.00)
LDL Cholesterol: 148 mg/dL — ABNORMAL HIGH (ref 0–99)
NonHDL: 180.14
Total CHOL/HDL Ratio: 4
Triglycerides: 163 mg/dL — ABNORMAL HIGH (ref 0.0–149.0)
VLDL: 32.6 mg/dL (ref 0.0–40.0)

## 2020-07-30 LAB — BASIC METABOLIC PANEL
BUN: 16 mg/dL (ref 6–23)
CO2: 25 mEq/L (ref 19–32)
Calcium: 10 mg/dL (ref 8.4–10.5)
Chloride: 104 mEq/L (ref 96–112)
Creatinine, Ser: 0.86 mg/dL (ref 0.40–1.20)
GFR: 69.38 mL/min (ref >60.00)
Glucose, Bld: 104 mg/dL — ABNORMAL HIGH (ref 70–99)
Potassium: 4 mEq/L (ref 3.5–5.1)
Sodium: 137 mEq/L (ref 135–145)

## 2020-07-30 LAB — TSH: TSH: 3.77 u[IU]/mL (ref 0.35–4.50)

## 2020-07-30 LAB — HEPATIC FUNCTION PANEL
ALT: 19 U/L (ref 0–35)
AST: 19 U/L (ref 0–37)
Albumin: 4.5 g/dL (ref 3.5–5.2)
Alkaline Phosphatase: 45 U/L (ref 39–117)
Bilirubin, Direct: 0.1 mg/dL (ref 0.0–0.3)
Total Bilirubin: 0.5 mg/dL (ref 0.2–1.2)
Total Protein: 7.4 g/dL (ref 6.0–8.3)

## 2020-07-30 LAB — HEMOGLOBIN A1C: Hgb A1c MFr Bld: 5.9 % (ref 4.6–6.5)

## 2020-08-05 DIAGNOSIS — G4733 Obstructive sleep apnea (adult) (pediatric): Secondary | ICD-10-CM | POA: Diagnosis not present

## 2020-08-06 ENCOUNTER — Ambulatory Visit: Payer: Medicare PPO | Admitting: Internal Medicine

## 2020-08-06 ENCOUNTER — Other Ambulatory Visit: Payer: Self-pay

## 2020-08-06 ENCOUNTER — Encounter: Payer: Self-pay | Admitting: Internal Medicine

## 2020-08-06 DIAGNOSIS — R739 Hyperglycemia, unspecified: Secondary | ICD-10-CM | POA: Diagnosis not present

## 2020-08-06 DIAGNOSIS — F439 Reaction to severe stress, unspecified: Secondary | ICD-10-CM | POA: Diagnosis not present

## 2020-08-06 DIAGNOSIS — K219 Gastro-esophageal reflux disease without esophagitis: Secondary | ICD-10-CM | POA: Diagnosis not present

## 2020-08-06 DIAGNOSIS — G4733 Obstructive sleep apnea (adult) (pediatric): Secondary | ICD-10-CM | POA: Diagnosis not present

## 2020-08-06 DIAGNOSIS — E7800 Pure hypercholesterolemia, unspecified: Secondary | ICD-10-CM

## 2020-08-06 DIAGNOSIS — Z86010 Personal history of colonic polyps: Secondary | ICD-10-CM | POA: Diagnosis not present

## 2020-08-06 NOTE — Assessment & Plan Note (Addendum)
The 10-year ASCVD risk score Denman George DC Montez Hageman., et al., 2013) is: 4.9%   Values used to calculate the score:     Age: 70 years     Sex: Female     Is Non-Hispanic African American: No     Diabetic: No     Tobacco smoker: No     Systolic Blood Pressure: 98 mmHg     Is BP treated: No     HDL Cholesterol: 57.5 mg/dL     Total Cholesterol: 238 mg/dL  Low cholesterol diet and exercise.  Follow lipid panel.

## 2020-08-06 NOTE — Progress Notes (Signed)
Patient ID: Jessica Peck, female   DOB: 1950-10-16, 70 y.o.   MRN: [id number]   Subjective:    Patient ID: Jessica Peck, female    DOB: 09/07/1950, 70 y.o.   MRN: [id number]  HPI This visit occurred during the SARS-CoV-2 public health emergency.  Safety protocols were in place, including screening questions prior to the visit, additional usage of staff PPE, and extensive cleaning of exam room while observing appropriate contact time as indicated for disinfecting solutions.  Patient here for a scheduled follow up.  Diagnosed with sleep apnea.  Has been using cpap for the past three weeks.  She has noticed increased chest pressure and feels she is swallowing air.  Notices in am.  Belches/passes gas - feels better.  No chest pain or tightness with increased exertion.  She is in the process of getting a nasal piece - hopefully will help the above.  No cough or congestion.  No sob reported.  Bowels stable.  Handling stress.   Past Medical History:  Diagnosis Date  . Chronic constipation   . Esophagitis   . Gastritis    EGD (hiatal hernia)  . GERD (gastroesophageal reflux disease)   . Hypercholesterolemia   . Sleep apnea    CPAP  . Vitamin D deficiency    Past Surgical History:  Procedure Laterality Date  . APPENDECTOMY  1976   Family History  Problem Relation Age of Onset  . Colon cancer Father   . Colon cancer Mother   . COPD Mother   . Heart disease Mother   . Breast cancer Sister        died age 63  . Breast cancer Maternal Uncle 60  . Breast cancer Paternal Aunt 55   Social History   Socioeconomic History  . Marital status: Married    Spouse name: Not on file  . Number of children: 2  . Years of education: Not on file  . Highest education level: Not on file  Occupational History  . Not on file  Tobacco Use  . Smoking status: Never Smoker  . Smokeless tobacco: Never Used  Substance and Sexual Activity  . Alcohol use: Yes    Alcohol/week: 0.0 standard drinks     Comment: occasional  . Drug use: No  . Sexual activity: Not on file  Other Topics Concern  . Not on file  Social History Narrative  . Not on file   Social Determinants of Health   Financial Resource Strain: Low Risk   . Difficulty of Paying Living Expenses: Not hard at all  Food Insecurity: No Food Insecurity  . Worried About Charity fundraiser in the Last Year: Never true  . Ran Out of Food in the Last Year: Never true  Transportation Needs: No Transportation Needs  . Lack of Transportation (Medical): No  . Lack of Transportation (Non-Medical): No  Physical Activity: Unknown  . Days of Exercise per Week: 0 days  . Minutes of Exercise per Session: Not on file  Stress: No Stress Concern Present  . Feeling of Stress : Not at all  Social Connections: Unknown  . Frequency of Communication with Friends and Family: Not on file  . Frequency of Social Gatherings with Friends and Family: Not on file  . Attends Religious Services: Not on file  . Active Member of Clubs or Organizations: Not on file  . Attends Archivist Meetings: Not on file  . Marital Status: Married    Outpatient Encounter  Medications as of 08/06/2020  Medication Sig  . Cyanocobalamin 1000 MCG TBCR Take by mouth.  . melatonin 5 MG TABS Take 5 mg by mouth at bedtime.  . meloxicam (MOBIC) 7.5 MG tablet Take 7.5 mg by mouth daily as needed.   . pantoprazole (PROTONIX) 20 MG tablet ONE TO TWO TABLETS EVERY DAY  . sertraline (ZOLOFT) 50 MG tablet TAKE 1 AND 1/2 TABLET PER DAY  . VITAMIN D, CHOLECALCIFEROL, PO Take 4,000 Units by mouth daily.   . [DISCONTINUED] Melatonin 3 MG TABS Take 0.5 tablets by mouth at bedtime. (Patient not taking: Reported on 08/06/2020)   No facility-administered encounter medications on file as of 08/06/2020.    Review of Systems  Constitutional: Negative for appetite change and unexpected weight change.  HENT: Negative for congestion and sinus pressure.   Respiratory: Negative  for cough and shortness of breath.        Chest tightness as outlined - occurs after using cpap.    Cardiovascular: Negative for chest pain, palpitations and leg swelling.  Gastrointestinal: Negative for abdominal pain, diarrhea, nausea and vomiting.  Genitourinary: Negative for difficulty urinating and dysuria.  Musculoskeletal: Negative for joint swelling and myalgias.  Skin: Negative for color change and rash.  Neurological: Negative for dizziness, light-headedness and headaches.  Psychiatric/Behavioral: Negative for agitation and dysphoric mood.       Objective:    Physical Exam Vitals reviewed.  Constitutional:      General: She is not in acute distress.    Appearance: Normal appearance.  HENT:     Head: Normocephalic and atraumatic.     Right Ear: External ear normal.     Left Ear: External ear normal.  Eyes:     General: No scleral icterus.       Right eye: No discharge.        Left eye: No discharge.     Conjunctiva/sclera: Conjunctivae normal.  Neck:     Thyroid: No thyromegaly.  Cardiovascular:     Rate and Rhythm: Normal rate and regular rhythm.  Pulmonary:     Effort: No respiratory distress.     Breath sounds: Normal breath sounds. No wheezing.  Abdominal:     General: Bowel sounds are normal.     Palpations: Abdomen is soft.     Tenderness: There is no abdominal tenderness.  Musculoskeletal:        General: No swelling or tenderness.     Cervical back: Neck supple. No tenderness.  Lymphadenopathy:     Cervical: No cervical adenopathy.  Skin:    Findings: No erythema or rash.  Neurological:     Mental Status: She is alert.  Psychiatric:        Mood and Affect: Mood normal.        Behavior: Behavior normal.     BP 104/68   Pulse 84   Temp 97.9 F (36.6 C) (Oral)   Ht 5' (1.524 m)   Wt 140 lb (63.5 kg)   LMP 06/04/2000   SpO2 97%   BMI 27.34 kg/m  Wt Readings from Last 3 Encounters:  08/06/20 140 lb (63.5 kg)  07/01/20 135 lb (61.2 kg)   05/01/20 135 lb (61.2 kg)     Lab Results  Component Value Date   WBC 3.9 (L) 07/30/2020   HGB 13.0 07/30/2020   HCT 38.8 07/30/2020   PLT 228.0 07/30/2020   GLUCOSE 104 (H) 07/30/2020   CHOL 238 (H) 07/30/2020   TRIG 163.0 (H) 07/30/2020  HDL 57.50 07/30/2020   LDLDIRECT 146.0 09/23/2019   LDLCALC 148 (H) 07/30/2020   ALT 19 07/30/2020   AST 19 07/30/2020   NA 137 07/30/2020   K 4.0 07/30/2020   CL 104 07/30/2020   CREATININE 0.86 07/30/2020   BUN 16 07/30/2020   CO2 25 07/30/2020   TSH 3.77 07/30/2020   HGBA1C 5.9 07/30/2020    CT CORONARY MORPH W/CTA COR W/SCORE W/CA W/CM &/OR WO/CM  Addendum Date: 03/26/2020   ADDENDUM REPORT: 03/26/2020 15:14 EXAM: OVER-READ INTERPRETATION  CT CHEST The following report is an over-read performed by radiologist Dr. Norlene Duel Devereux [facility] on 03/26/2020. This over-read does not include interpretation of cardiac or coronary anatomy or pathology. The coronary CTA and coronary calcium score interpretation by the cardiologist is attached. COMPARISON:  None. FINDINGS: No mediastinal mass or adenopathy. Calcified subcarinal lymph node compatible with chronic granulomatous disease. Visualized lung bases appear clear. No acute findings noted within the imaged portions of the upper abdomen. No acute osseous findings. Degenerative disc disease noted within the imaged portions of the lower thoracic spine. IMPRESSION: 1. No significant, noncardiac supplemental findings 2. Prior granulomatous disease. Electronically Signed   By: Kerby Moors M.D.   On: 03/26/2020 15:14   Result Date: 03/26/2020 CLINICAL DATA:  Chest pain EXAM: Cardiac/Coronary  CTA TECHNIQUE: The patient was scanned on a Siemens Somatoform go.Top scanner. FINDINGS: A retrospective scan was triggered in the descending thoracic aorta. Axial non-contrast 3 mm slices were carried out through the heart. The data set was analyzed on a dedicated work station and scored using  the [place]. Gantry rotation speed was 330 msecs and collimation was .6 mm. 134m of metoprolol and 0.8 mg of sl NTG was given. The 3D data set was reconstructed in 5% intervals of the 60-95 % of the R-R cycle. Diastolic phases were analyzed on a dedicated work station using MPR, MIP and VRT modes. The patient received 75 cc of contrast. Aorta: Normal size. Minimal aortic root calcifications. No dissection. Aortic Valve:  Trileaflet.  No calcifications. Coronary Arteries:  Normal coronary origin.  Right dominance. RCA is a dominant artery that gives rise to PDA and PLA. There is no plaque. Left main has no disease,and gives rise to LAD and LCX arteries. LAD has no plaque. Gives rise to a large first diagonal artery also free of disease. LCX is a non-dominant artery that gives rise to one OM1 branch. There is no plaque. Other findings: Normal pulmonary vein drainage into the left atrium. Normal left atrial appendage without a thrombus. Normal size of the pulmonary artery. IMPRESSION: 1. Normal coronary calcium score of 0. Patient is low risk for coronary events 2. Normal coronary origin with right dominance. 3. No evidence of CAD. 4. CAD-RADS 0. Consider non-atherosclerotic causes of chest pain. Electronically Signed: By: BKate SableM.D. On: 03/26/2020 14:42       Assessment & Plan:   Problem List Items Addressed This Visit    GERD (gastroesophageal reflux disease)    Upper symptoms controlled on protonix.        History of colonic polyps    Colonoscopy 12/13/19 - diverticulosis and internal hemorrhoids.       Hyperglycemia    Low carb diet and exercise.  Follow met b and a1c.       Relevant Orders   Hemoglobin AH3Z  Basic metabolic panel   Obstructive sleep apnea    Appears to be trapping air with cpap.  Waiting  on nasal piece.  Continuing to use.  Follow.       Pure hypercholesterolemia    The 10-year ASCVD risk score Mikey Bussing DC Jr., et al., 2013) is: 4.9%   Values used to  calculate the score:     Age: 61 years     Sex: Female     Is Non-Hispanic African American: No     Diabetic: No     Tobacco smoker: No     Systolic Blood Pressure: 98 mmHg     Is BP treated: No     HDL Cholesterol: 57.5 mg/dL     Total Cholesterol: 238 mg/dL  Low cholesterol diet and exercise.  Follow lipid panel.       Relevant Orders   CBC with Differential/Platelet   Hepatic function panel   Lipid panel   Basic metabolic panel   Stress    On zoloft.  Feels things are stable.  Current dose appears to be doing well.  Follow.           Einar Pheasant, MD

## 2020-08-08 ENCOUNTER — Encounter: Payer: Self-pay | Admitting: Internal Medicine

## 2020-08-08 NOTE — Assessment & Plan Note (Signed)
Appears to be trapping air with cpap.  Waiting on nasal piece.  Continuing to use.  Follow.

## 2020-08-08 NOTE — Assessment & Plan Note (Signed)
Colonoscopy 12/13/19 - diverticulosis and internal hemorrhoids.  

## 2020-08-08 NOTE — Assessment & Plan Note (Signed)
Low carb diet and exercise.  Follow met b and a1c.  

## 2020-08-08 NOTE — Assessment & Plan Note (Signed)
Upper symptoms controlled on protonix.  

## 2020-08-08 NOTE — Assessment & Plan Note (Signed)
On zoloft.  Feels things are stable.  Current dose appears to be doing well.  Follow.

## 2020-08-27 ENCOUNTER — Encounter: Payer: Self-pay | Admitting: Internal Medicine

## 2020-08-31 NOTE — Telephone Encounter (Signed)
Spoke with patient. Confirmed no large amount of blood. No persistent bleeding. No abd pain. She used something OTC and they are better. She will let us know if she has persistent symptoms.

## 2020-09-05 DIAGNOSIS — G4733 Obstructive sleep apnea (adult) (pediatric): Secondary | ICD-10-CM | POA: Diagnosis not present

## 2020-09-19 ENCOUNTER — Other Ambulatory Visit: Payer: Self-pay | Admitting: Internal Medicine

## 2020-10-05 DIAGNOSIS — G4733 Obstructive sleep apnea (adult) (pediatric): Secondary | ICD-10-CM | POA: Diagnosis not present

## 2020-10-12 ENCOUNTER — Telehealth: Payer: Self-pay

## 2020-10-12 ENCOUNTER — Ambulatory Visit: Payer: Medicare PPO | Admitting: Primary Care

## 2020-10-12 NOTE — Telephone Encounter (Signed)
Jessica Peck called into the office on 10/10/20 stating that she has Covid and is requesting Paxlovid. Pt c/o sore throat, congestion, headache. Patient was instructed to go to [facility] to be evaluated. Called and spoke to Jessica Peck and she states that she was able to receive the Paxlovid medication. Pt declines going to the urgent care to be seen. She states that she has a Surveyor, quantity Friend" who was able to give her the medication over the weekend. Jessica Peck declined an appointment or to be evaluated for her symptoms and states that she is feeling better from 10/10/20. Provided Access Nurse documentation, image can not be loaded due to Internet issues.

## 2020-10-12 NOTE — Telephone Encounter (Signed)
Noted She is feeling better and has been prescribed paxlovid

## 2020-10-13 NOTE — Telephone Encounter (Signed)
Noted. Please let her know that I was out of the office.  Also, Please notify Aren to keep [place] posted and let us know if needs anything.

## 2020-10-13 NOTE — Telephone Encounter (Signed)
Patient aware of below.

## 2020-10-29 ENCOUNTER — Other Ambulatory Visit: Payer: Self-pay | Admitting: Internal Medicine

## 2020-10-29 DIAGNOSIS — Z1231 Encounter for screening mammogram for malignant neoplasm of breast: Secondary | ICD-10-CM

## 2020-11-05 DIAGNOSIS — G4733 Obstructive sleep apnea (adult) (pediatric): Secondary | ICD-10-CM | POA: Diagnosis not present

## 2020-11-18 ENCOUNTER — Ambulatory Visit: Payer: Medicare PPO | Admitting: Internal Medicine

## 2020-12-02 ENCOUNTER — Ambulatory Visit: Payer: Medicare PPO | Admitting: Primary Care

## 2020-12-02 DIAGNOSIS — G4733 Obstructive sleep apnea (adult) (pediatric): Secondary | ICD-10-CM | POA: Diagnosis not present

## 2020-12-03 ENCOUNTER — Other Ambulatory Visit (INDEPENDENT_AMBULATORY_CARE_PROVIDER_SITE_OTHER): Payer: Medicare PPO

## 2020-12-03 ENCOUNTER — Other Ambulatory Visit: Payer: Self-pay

## 2020-12-03 DIAGNOSIS — E7800 Pure hypercholesterolemia, unspecified: Secondary | ICD-10-CM

## 2020-12-03 DIAGNOSIS — R739 Hyperglycemia, unspecified: Secondary | ICD-10-CM

## 2020-12-03 LAB — CBC WITH DIFFERENTIAL/PLATELET
Basophils Absolute: 0 10*3/uL (ref 0.0–0.1)
Basophils Relative: 0.8 % (ref 0.0–3.0)
Eosinophils Absolute: 0.2 10*3/uL (ref 0.0–0.7)
Eosinophils Relative: 4.3 % (ref 0.0–5.0)
HCT: 38.7 % (ref 36.0–46.0)
Hemoglobin: 12.7 g/dL (ref 12.0–15.0)
Lymphocytes Relative: 46.5 % — ABNORMAL HIGH (ref 12.0–46.0)
Lymphs Abs: 2.1 10*3/uL (ref 0.7–4.0)
MCHC: 32.8 g/dL (ref 30.0–36.0)
MCV: 90.6 fl (ref 78.0–100.0)
Monocytes Absolute: 0.3 10*3/uL (ref 0.1–1.0)
Monocytes Relative: 6.9 % (ref 3.0–12.0)
Neutro Abs: 1.8 10*3/uL (ref 1.4–7.7)
Neutrophils Relative %: 41.5 % — ABNORMAL LOW (ref 43.0–77.0)
Platelets: 254 10*3/uL (ref 150.0–400.0)
RBC: 4.28 Mil/uL (ref 3.87–5.11)
RDW: 13.8 % (ref 11.5–15.5)
WBC: 4.5 10*3/uL (ref 4.0–10.5)

## 2020-12-03 LAB — BASIC METABOLIC PANEL
BUN: 18 mg/dL (ref 6–23)
CO2: 25 mEq/L (ref 19–32)
Calcium: 9.9 mg/dL (ref 8.4–10.5)
Chloride: 106 mEq/L (ref 96–112)
Creatinine, Ser: 0.83 mg/dL (ref 0.40–1.20)
GFR: 72.22 mL/min (ref >60.00)
Glucose, Bld: 106 mg/dL — ABNORMAL HIGH (ref 70–99)
Potassium: 4.2 mEq/L (ref 3.5–5.1)
Sodium: 140 mEq/L (ref 135–145)

## 2020-12-03 LAB — HEPATIC FUNCTION PANEL
ALT: 16 U/L (ref 0–35)
AST: 16 U/L (ref 0–37)
Albumin: 4.4 g/dL (ref 3.5–5.2)
Alkaline Phosphatase: 45 U/L (ref 39–117)
Bilirubin, Direct: 0.1 mg/dL (ref 0.0–0.3)
Total Bilirubin: 0.4 mg/dL (ref 0.2–1.2)
Total Protein: 6.9 g/dL (ref 6.0–8.3)

## 2020-12-03 LAB — LIPID PANEL
Cholesterol: 214 mg/dL — ABNORMAL HIGH (ref 0–200)
HDL: 56 mg/dL (ref >39.00)
LDL Cholesterol: 125 mg/dL — ABNORMAL HIGH (ref 0–99)
NonHDL: 157.99
Total CHOL/HDL Ratio: 4
Triglycerides: 164 mg/dL — ABNORMAL HIGH (ref 0.0–149.0)
VLDL: 32.8 mg/dL (ref 0.0–40.0)

## 2020-12-03 LAB — HEMOGLOBIN A1C: Hgb A1c MFr Bld: 6 % (ref 4.6–6.5)

## 2020-12-04 ENCOUNTER — Ambulatory Visit
Admission: RE | Admit: 2020-12-04 | Discharge: 2020-12-04 | Disposition: A | Discharge status: Home / Self Care | Payer: Medicare PPO | Source: Ambulatory Visit | Attending: Internal Medicine | Admitting: Internal Medicine

## 2020-12-04 ENCOUNTER — Encounter: Payer: Self-pay | Admitting: Primary Care

## 2020-12-04 ENCOUNTER — Ambulatory Visit (INDEPENDENT_AMBULATORY_CARE_PROVIDER_SITE_OTHER): Payer: Medicare PPO | Admitting: Primary Care

## 2020-12-04 DIAGNOSIS — G4733 Obstructive sleep apnea (adult) (pediatric): Secondary | ICD-10-CM

## 2020-12-04 DIAGNOSIS — Z1231 Encounter for screening mammogram for malignant neoplasm of breast: Secondary | ICD-10-CM | POA: Insufficient documentation

## 2020-12-04 NOTE — Patient Instructions (Addendum)
Recommendations: Try nasal mask with chin strap No pressure changes today Washing tubing every day, wipe down mask every day and clean water chamber once a week  Continue to wear CPAP every night for min 4-6 hours a night  Please call DME company and ask that they send you CPAP supplies ([employer]- [phone]) Let us know if we need to place an order for nasal mask  If you continue to have issues with CPAP and are intolerant we can consider ENT referral for inspire device   Follow-up: 3 months with Dr. Maple Hudson   CPAP and BPAP Information CPAP and BPAP (also called BiPAP) are methods that use air pressure to keep your airways open and to help you breathe well. CPAP and BPAP use different amounts of pressure. Your health care provider will tell you whether CPAP or BPAP would be more helpful for you. CPAP stands for "continuous positive airway pressure." With CPAP, the amount of pressure stays the same while you breathe in (inhale) and out (exhale). BPAP stands for "bi-level positive airway pressure." With BPAP, the amount of pressure will be higher when you inhale and lower when you exhale. This allows you to take larger breaths. CPAP or BPAP may be used in the hospital, or your health care provider may want you to use it at home. You may need to have a sleep study before your health care provider can order a machine for you to use at home. What are the advantages? CPAP or BPAP can be helpful if you have: Sleep apnea. Chronic obstructive pulmonary disease (COPD). Heart failure. Medical conditions that cause muscle weakness, including muscular dystrophy or amyotrophic lateral sclerosis (ALS). Other problems that cause breathing to be shallow, weak, abnormal, or difficult. CPAP and BPAP are most commonly used for obstructive sleep apnea (OSA) to keep the airways from collapsing when the muscles relax during sleep. What are the risks? Generally, this is a safe treatment. However, problems may occur,  including: Irritated skin or skin sores if the mask does not fit properly. Dry or stuffy nose or nosebleeds. Dry mouth. Feeling gassy or bloated. Sinus or lung infection if the equipment is not cleaned properly. When should CPAP or BPAP be used? In most cases, the mask only needs to be worn during sleep. Generally, the mask needs to be worn throughout the night and during any daytime naps. People with certain medical conditions may also need to wear the mask at other times, such as when they are awake. Follow instructions from your health care provider about when to use the machine. What happens during CPAP or BPAP? Both CPAP and BPAP are provided by a small machine with a flexible plastic tube that attaches to a plastic mask that you wear. Air is blown through the mask into your nose or mouth. The amount of pressure that is used to blow the air can be adjusted on the machine. Your health care provider will set the pressure setting and help you find the best mask for you. Tips for using the mask Because the mask needs to be snug, some people feel trapped or closed-in (claustrophobic) when first using the mask. If you feel this way, you may need to get used to the mask. One way to do this is to hold the mask loosely over your nose or mouth and then gradually apply the mask more snugly. You can also gradually increase the amount of time that you use the mask. Masks are available in various types and  sizes. If your mask does not fit well, talk with your health care provider about getting a different one. Some common types of masks include: Full face masks, which fit over the mouth and nose. Nasal masks, which fit over the nose. Nasal pillow or prong masks, which fit into the nostrils. If you are using a mask that fits over your nose and you tend to breathe through your mouth, a chin strap may be applied to help keep your mouth closed. Use a skin barrier to protect your skin as told by your health care  provider. Some CPAP and BPAP machines have alarms that may sound if the mask comes off or develops a leak. If you have trouble with the mask, it is very important that you talk with your health care provider about finding a way to make the mask easier to tolerate. Do not stop using the mask. There could be a negative impact on your health if you stop using the mask. Tips for using the machine Place your CPAP or BPAP machine on a secure table or stand near an electrical outlet. Know where the on/off switch is on the machine. Follow instructions from your health care provider about how to set the pressure on your machine and when you should use it. Do not eat or drink while the CPAP or BPAP machine is on. Food or fluids could get pushed into your lungs by the pressure of the CPAP or BPAP. For home use, CPAP and BPAP machines can be rented or purchased through home health care companies. Many different brands of machines are available. Renting a machine before purchasing may help you find out which particular machine works well for you. Your health insurance company may also decide which machine you may get. Keep the CPAP or BPAP machine and attachments clean. Ask your health care provider for specific instructions. Check the humidifier if you have a dry stuffy nose or nosebleeds. Make sure it is working correctly. Follow these instructions at home: Take over-the-counter and prescription medicines only as told by your health care provider. Ask if you can take sinus medicine if your sinuses are blocked. Do not use any products that contain nicotine or tobacco. These products include cigarettes, chewing tobacco, and vaping devices, such as e-cigarettes. If you need help quitting, ask your health care provider. Keep all follow-up visits. This is important. Contact a health care provider if: You have redness or pressure sores on your head, face, mouth, or nose from the mask or head gear. You have trouble  using the CPAP or BPAP machine. You cannot tolerate wearing the CPAP or BPAP mask. Someone tells you that you snore even when wearing your CPAP or BPAP. Get help right away if: You have trouble breathing. You feel confused. Summary CPAP and BPAP are methods that use air pressure to keep your airways open and to help you breathe well. If you have trouble with the mask, it is very important that you talk with your health care provider about finding a way to make the mask easier to tolerate. Do not stop using the mask. There could be a negative impact to your health if you stop using the mask. Follow instructions from your health care provider about when to use the machine. This information is not intended to replace advice given to you by your health care provider. Make sure you discuss any questions you have with your health care provider. Document Revised: 02/21/2020 Document Reviewed: 02/21/2020 Elsevier Patient Education  2022 [employer]  

## 2020-12-04 NOTE — Progress Notes (Signed)
@Patient  ID: , female    DOB: 02-22-1951, 70 y.o.   MRN: [id number]  Chief Complaint  Patient presents with  . Follow-up    Pt states questions about CPAP    Referring provider: [id number], MD  HPI: 70 year old female, never smoked. PMH significant for OSA, GERD, esophagitis, fatigue. Patient of Dr. 73.  Previous [facility] pulmonary encounter: 10/28/19- Sleep consult  Patient presents today for sleep consult referred by Dr. 12/28/19. She had a CPAP titration study in 2013 which showed mild OSA with AHI 12.4/hr. She did not tolerate CPAP, she used it for 1 month but could not get comfortable. States that her mind was not in the right place. Reports early morning snoring, daytime fatigue and restless sleep. She takes melatonin 3mg  at bedtime. She is not as active during the day, retired 4 years ago. She has put on approx 10 lbs since her last sleep study.   Sleep consult form Symptoms: Snoring, daytime fatigue, restless sleep Bedtime: 11-11:30pm Wake time: 9am Nocturnal awakenings: Once to use restroom Weight: 138lbs  Epworth: 6/24  02/05/20- 69 yoF for sleep evaluation due to OSA Medical problem list includes GERD, Hypercholesterolemia,  Previous limited experience with CPAP- poorly tolerated in 2013 with AHI 12.4 at that time.  HST 11/18/19 AHI 33/ hr, desaturation to 68%, body weight 138 lbs Had CPAP titration at [facility] recommending CPAP auto 6-9, small nasal pillows.  Seen initially by 11/20/19, NP on 10/28/19 on referral from Dr Clent Ridges with c/o morning fatigue, snoring, restless sleep. Melatonin 3 mg hs.  She wanted to discuss options with physician. Body weight-138 lbs Epworth score- Covid vax- 3 Phizer Flu vax- had Neg family hx OSA. She has awakened with chest pressure/ anxious- suspects this was apnea but she has cardiology eval pending. Denies personal hx heart/ lung disease. Husband tells her of noisy breathing/ ?snoring. Not much aware of daytime  sleepiness- admits occasionally would like a nap. Minimizes impact on alertness/ driving.  2 cups AM 1/2 caff. Melatonin 3 mg at hs. ENT surgery+ tonsils. Retired 12/28/19.     12/04/2020- interim hx  Patient presents today for annual follow-up/OSA. She was started on CPAP 5-15cm h20 back in March-April 2022. She is compliant with CPAP use but is having difficulty getting getting used to the mask. She is sleeping through the night and no longer having chest discomfort. Still gets some daytime fatigue in the afternoon and will occasionally require a nap. Epworth 6/24   Airview download 10/23/20-11/21/20 Usage 30/30 days used; 100% > 4 hours Average usage 9 hours 2 mins Pressure 5-15cm h20 (12.2) Airleaks 15.4 (95%) AHI 5.7  Allergies  Allergen Reactions  . Penicillins Swelling    Immunization History  Administered Date(s) Administered  . Influenza Split 01/02/2013, 01/14/2014, 01/20/2016  . Influenza, High Dose Seasonal PF 01/16/2020  . Influenza,inj,Quad PF,6+ Mos 12/26/2016  . Influenza-Unspecified 01/27/2018  . PFIZER Comirnaty(Gray Top)Covid-19 Tri-Sucrose Vaccine 07/28/2020  . PFIZER(Purple Top)SARS-COV-2 Vaccination 04/01/2019, 04/22/2019, 02/01/2020  . Pneumococcal Conjugate-13 04/10/2018  . Td 09/11/2011  . Zoster Recombinat (Shingrix) 10/18/2018, 01/01/2019  . Zoster, Live 01/20/2016    Past Medical History:  Diagnosis Date  . Chronic constipation   . Esophagitis   . Gastritis    EGD (hiatal hernia)  . GERD (gastroesophageal reflux disease)   . Hypercholesterolemia   . Sleep apnea    CPAP  . Vitamin D deficiency     Tobacco History: Social History   Tobacco Use  Smoking Status Never  Smokeless Tobacco Never   Counseling given: Not Answered   Outpatient Medications Prior to Visit  Medication Sig Dispense Refill  . Cyanocobalamin 1000 MCG TBCR Take by mouth.    . melatonin 5 MG TABS Take 5 mg by mouth at bedtime.    . meloxicam (MOBIC) 7.5 MG tablet Take  7.5 mg by mouth daily as needed.     . pantoprazole (PROTONIX) 20 MG tablet ONE TO TWO TABLETS EVERY DAY 180 tablet 1  . sertraline (ZOLOFT) 50 MG tablet TAKE 1 AND 1/2 TABLET PER DAY 135 tablet 1  . VITAMIN D, CHOLECALCIFEROL, PO Take 4,000 Units by mouth daily.      No facility-administered medications prior to visit.      Review of Systems  Review of Systems  Constitutional:  Positive for fatigue.  HENT: Negative.    Respiratory: Negative.    Psychiatric/Behavioral:  Negative for sleep disturbance.     Physical Exam  BP 110/76 (BP Location: Left Arm, Patient Position: Sitting, Cuff Size: Normal)   Pulse 68   Temp 98.8 F (37.1 C) (Oral)   Ht 5' (1.524 m)   Wt 138 lb 3.2 oz (62.7 kg)   LMP 06/04/2000   SpO2 98%   BMI 26.99 kg/m  Physical Exam Constitutional:      Appearance: Normal appearance.  HENT:     Mouth/Throat:     Mouth: Mucous membranes are moist.     Pharynx: Oropharynx is clear.  Cardiovascular:     Rate and Rhythm: Normal rate and regular rhythm.  Pulmonary:     Effort: Pulmonary effort is normal.     Breath sounds: Normal breath sounds.  Musculoskeletal:        General: Normal range of motion.  Skin:    General: Skin is warm and dry.  Neurological:     General: No focal deficit present.     Mental Status: She is alert and oriented to person, place, and time. Mental status is at baseline.  Psychiatric:        Mood and Affect: Mood normal.        Behavior: Behavior normal.        Thought Content: Thought content normal.        Judgment: Judgment normal.     Lab Results:  CBC    Component Value Date/Time   WBC 4.5 12/03/2020 0859   RBC 4.28 12/03/2020 0859   HGB 12.7 12/03/2020 0859   HGB 14.2 05/14/2011 1741   HCT 38.7 12/03/2020 0859   HCT 42.2 05/14/2011 1741   PLT 254.0 12/03/2020 0859   PLT 235 05/14/2011 1741   MCV 90.6 12/03/2020 0859   MCV 90 05/14/2011 1741   MCH 30.0 05/14/2011 1741   MCH 29.2 08/10/2010 1455   MCHC 32.8  12/03/2020 0859   RDW 13.8 12/03/2020 0859   RDW 13.2 05/14/2011 1741   LYMPHSABS 2.1 12/03/2020 0859   LYMPHSABS 2.7 05/14/2011 1741   MONOABS 0.3 12/03/2020 0859   MONOABS 0.3 05/14/2011 1741   EOSABS 0.2 12/03/2020 0859   EOSABS 0.1 05/14/2011 1741   BASOSABS 0.0 12/03/2020 0859   BASOSABS 0.0 05/14/2011 1741    BMET    Component Value Date/Time   NA 140 12/03/2020 0859   NA 140 05/14/2011 1741   K 4.2 12/03/2020 0859   K 3.8 05/14/2011 1741   CL 106 12/03/2020 0859   CL 104 05/14/2011 1741   CO2 25 12/03/2020 0859  CO2 22 05/14/2011 1741   GLUCOSE 106 (H) 12/03/2020 0859   GLUCOSE 141 (H) 05/14/2011 1741   BUN 18 12/03/2020 0859   BUN 20 (H) 05/14/2011 1741   CREATININE 0.83 12/03/2020 0859   CREATININE 0.73 11/23/2011 0756   CALCIUM 9.9 12/03/2020 0859   CALCIUM 9.7 05/14/2011 1741   GFRNONAA >60 11/23/2011 0756   GFRAA >60 11/23/2011 0756    BNP No results found for: BNP  ProBNP No results found for: PROBNP  Imaging: No results found.   Assessment & Plan:   Obstructive sleep apnea - HST 11/18/19 AHI 33/hr, desaturated 68%. Body weight 138lbs. She had titration study at [facility] recommending CPAP auto 6-9 small nasal pillows - Patient is 100% compliant with CPAP and reports some benefit from use. She is no longer having chest discomfort but is still having some daytime fatigue. Epworth remains 6/24. She is also having a hard time getting used to wearing mask at night and feels it is too obtrusive - Pressure 5-15cm h20, residual AHI 5.7 - No changes today, recommend she try getting a nasal mask with chin strap to improve comfort. If she is still having difficulty tolerating CPAP she may be candidate for inspire device. She would like to hold off on referral at this time to ENT. Oral appliance likely would not be enough for her d/t several of OSA - FU in 3 months with Dr. Marlane Mingle, NP 12/04/2020

## 2020-12-04 NOTE — Assessment & Plan Note (Addendum)
-   HST 11/18/19 AHI 33/hr, desaturated 68%. Body weight 138lbs. She had titration study at [facility] recommending CPAP auto 6-9 small nasal pillows - Patient is 100% compliant with CPAP and reports some benefit from use. She is no longer having chest discomfort but is still having some daytime fatigue. Epworth remains 6/24. She is also having a hard time getting used to wearing mask at night and feels it is too obtrusive - Pressure 5-15cm h20, residual AHI 5.7 - No changes today, recommend she try getting a nasal mask with chin strap to improve comfort. If she is still having difficulty tolerating CPAP she may be candidate for inspire device. She would like to hold off on referral at this time to ENT. Oral appliance likely would not be enough for her d/t several of OSA - FU in 3 months with Dr. Maple Hudson

## 2020-12-06 DIAGNOSIS — G4733 Obstructive sleep apnea (adult) (pediatric): Secondary | ICD-10-CM | POA: Diagnosis not present

## 2020-12-07 ENCOUNTER — Ambulatory Visit (INDEPENDENT_AMBULATORY_CARE_PROVIDER_SITE_OTHER): Payer: Medicare PPO | Admitting: Internal Medicine

## 2020-12-07 ENCOUNTER — Encounter: Payer: Self-pay | Admitting: Internal Medicine

## 2020-12-07 ENCOUNTER — Telehealth: Payer: Self-pay | Admitting: Internal Medicine

## 2020-12-07 ENCOUNTER — Other Ambulatory Visit: Payer: Self-pay

## 2020-12-07 DIAGNOSIS — K219 Gastro-esophageal reflux disease without esophagitis: Secondary | ICD-10-CM

## 2020-12-07 DIAGNOSIS — R739 Hyperglycemia, unspecified: Secondary | ICD-10-CM | POA: Diagnosis not present

## 2020-12-07 DIAGNOSIS — M25512 Pain in left shoulder: Secondary | ICD-10-CM

## 2020-12-07 DIAGNOSIS — M25511 Pain in right shoulder: Secondary | ICD-10-CM | POA: Diagnosis not present

## 2020-12-07 DIAGNOSIS — M25551 Pain in right hip: Secondary | ICD-10-CM

## 2020-12-07 DIAGNOSIS — E7800 Pure hypercholesterolemia, unspecified: Secondary | ICD-10-CM

## 2020-12-07 DIAGNOSIS — G4733 Obstructive sleep apnea (adult) (pediatric): Secondary | ICD-10-CM

## 2020-12-07 DIAGNOSIS — Z86010 Personal history of colonic polyps: Secondary | ICD-10-CM | POA: Diagnosis not present

## 2020-12-07 DIAGNOSIS — F439 Reaction to severe stress, unspecified: Secondary | ICD-10-CM | POA: Diagnosis not present

## 2020-12-07 NOTE — Telephone Encounter (Signed)
Patient scheduled for January 2023 fasting labs as requested per check out note.   Needing orders placed

## 2020-12-07 NOTE — Assessment & Plan Note (Addendum)
The 10-year ASCVD risk score (Arnett DK, et al., 2019) is: 6.1%   Values used to calculate the score:     Age: 70 years     Sex: Female     Is Non-Hispanic African American: No     Diabetic: No     Tobacco smoker: No     Systolic Blood Pressure: 112 mmHg     Is BP treated: No     HDL Cholesterol: 56 mg/dL     Total Cholesterol: 214 mg/dL  Low cholesterol diet and exercise.  Follow lipid panel.

## 2020-12-07 NOTE — Progress Notes (Signed)
Patient ID: Jessica Peck, female   DOB: Jun 17, 1950, 70 y.o.   MRN: [id number]   Subjective:    Patient ID: Jessica Peck, female    DOB: [birth year], 70 y.o.   MRN: [id number]  This visit occurred during the SARS-CoV-2 public health emergency.  Safety protocols were in place, including screening questions prior to the visit, additional usage of staff PPE, and extensive cleaning of exam room while observing appropriate contact time as indicated for disinfecting solutions.   Patient here for a scheduled follow up.    HPI Here to follow up regarding cholesterol, stress and OSA.  CPAP - saw pulmonary - recommended nasal msk with chin strap.  Overall she feels she is doing relatively well.  No chest pain or sob reported.  No abdominal pain.  Handling stress.  Is having problems with right shoulder/right hip.  Has seen Rachelle Hora - meloxicam and stretches.  Discussed f/u.     Past Medical History:  Diagnosis Date   Chronic constipation    Esophagitis    Gastritis    EGD (hiatal hernia)   GERD (gastroesophageal reflux disease)    Hypercholesterolemia    Sleep apnea    CPAP   Vitamin D deficiency    Past Surgical History:  Procedure Laterality Date   APPENDECTOMY  1976   Family History  Problem Relation Age of Onset   Colon cancer Father    Colon cancer Mother    COPD Mother    Heart disease Mother    Breast cancer Sister        died age 54   Breast cancer Maternal Uncle 98   Breast cancer Paternal Aunt 46   Social History   Socioeconomic History   Marital status: Married    Spouse name: Not on file   Number of children: 2   Years of education: Not on file   Highest education level: Not on file  Occupational History   Not on file  Tobacco Use   Smoking status: Never   Smokeless tobacco: Never  Substance and Sexual Activity   Alcohol use: Yes    Alcohol/week: 0.0 standard drinks    Comment: occasional   Drug use: No   Sexual activity: Not on file  Other  Topics Concern   Not on file  Social History Narrative   Not on file   Social Determinants of Health   Financial Resource Strain: Low Risk    Difficulty of Paying Living Expenses: Not hard at all  Food Insecurity: No Food Insecurity   Worried About Charity fundraiser in the Last Year: Never true   [place] in the Last Year: Never true  Transportation Needs: No Transportation Needs   Lack of Transportation (Medical): No   Lack of Transportation (Non-Medical): No  Physical Activity: Unknown   Days of Exercise per Week: 0 days   Minutes of Exercise per Session: Not on file  Stress: No Stress Concern Present   Feeling of Stress : Not at all  Social Connections: Unknown   Frequency of Communication with Friends and Family: Not on file   Frequency of Social Gatherings with Friends and Family: Not on file   Attends Religious Services: Not on file   Active Member of Clubs or Organizations: Not on file   Attends Archivist Meetings: Not on file   Marital Status: Married    Review of Systems  Constitutional:  Negative for appetite change and unexpected weight  change.  HENT:  Negative for congestion and sinus pressure.   Respiratory:  Negative for cough, chest tightness and shortness of breath.   Cardiovascular:  Negative for chest pain, palpitations and leg swelling.  Gastrointestinal:  Negative for abdominal pain, diarrhea, nausea and vomiting.  Genitourinary:  Negative for difficulty urinating and dysuria.  Musculoskeletal:  Negative for joint swelling and myalgias.  Skin:  Negative for color change and rash.  Neurological:  Negative for dizziness, light-headedness and headaches.  Psychiatric/Behavioral:  Negative for agitation and dysphoric mood.       Objective:     BP 112/74   Pulse 88   Temp 97.7 F (36.5 C)   Resp 16   Ht 5' (1.524 m)   Wt 138 lb (62.6 kg)   LMP 06/04/2000   SpO2 98%   BMI 26.95 kg/m  Wt Readings from Last 3 Encounters:   12/07/20 138 lb (62.6 kg)  12/04/20 138 lb 3.2 oz (62.7 kg)  08/06/20 140 lb (63.5 kg)    Physical Exam Vitals reviewed.  Constitutional:      General: She is not in acute distress.    Appearance: Normal appearance.  HENT:     Head: Normocephalic and atraumatic.     Right Ear: External ear normal.     Left Ear: External ear normal.  Eyes:     General: No scleral icterus.       Right eye: No discharge.        Left eye: No discharge.     Conjunctiva/sclera: Conjunctivae normal.  Neck:     Thyroid: No thyromegaly.  Cardiovascular:     Rate and Rhythm: Normal rate and regular rhythm.  Pulmonary:     Effort: No respiratory distress.     Breath sounds: Normal breath sounds. No wheezing.  Abdominal:     General: Bowel sounds are normal.     Palpations: Abdomen is soft.     Tenderness: There is no abdominal tenderness.  Musculoskeletal:        General: No swelling or tenderness.     Cervical back: Neck supple. No tenderness.  Lymphadenopathy:     Cervical: No cervical adenopathy.  Skin:    Findings: No erythema or rash.  Neurological:     Mental Status: She is alert.  Psychiatric:        Mood and Affect: Mood normal.        Behavior: Behavior normal.     Outpatient Encounter Medications as of 12/07/2020  Medication Sig   Cyanocobalamin 1000 MCG TBCR Take by mouth.   melatonin 5 MG TABS Take 5 mg by mouth at bedtime.   meloxicam (MOBIC) 7.5 MG tablet Take 7.5 mg by mouth daily as needed.    pantoprazole (PROTONIX) 20 MG tablet ONE TO TWO TABLETS EVERY DAY   sertraline (ZOLOFT) 50 MG tablet TAKE 1 AND 1/2 TABLET PER DAY   VITAMIN D, CHOLECALCIFEROL, PO Take 1,000 Units by mouth daily.   No facility-administered encounter medications on file as of 12/07/2020.     Lab Results  Component Value Date   WBC 4.5 12/03/2020   HGB 12.7 12/03/2020   HCT 38.7 12/03/2020   PLT 254.0 12/03/2020   GLUCOSE 106 (H) 12/03/2020   CHOL 214 (H) 12/03/2020   TRIG 164.0 (H)  12/03/2020   HDL 56.00 12/03/2020   LDLDIRECT 146.0 09/23/2019   LDLCALC 125 (H) 12/03/2020   ALT 16 12/03/2020   AST 16 12/03/2020   NA 140 12/03/2020  K 4.2 12/03/2020   CL 106 12/03/2020   CREATININE 0.83 12/03/2020   BUN 18 12/03/2020   CO2 25 12/03/2020   TSH 3.77 07/30/2020   HGBA1C 6.0 12/03/2020       Assessment & Plan:   Problem List Items Addressed This Visit     GERD (gastroesophageal reflux disease)    Upper symptoms controlled on protonix.        History of colonic polyps    Colonoscopy 12/13/19 - diverticulosis and internal hemorrhoids.       Hyperglycemia    Low carb diet and exercise.  Follow met b and a1c.       Obstructive sleep apnea    Using cpap.  Residual AHI 5.7.  Seeing pulmonary.  Recommended she try nasal mask with chin strap to improve comfort.  Also discussed Inspire.        Pure hypercholesterolemia    The 10-year ASCVD risk score (Arnett DK, et al., 2019) is: 6.1%   Values used to calculate the score:     Age: 36 years     Sex: Female     Is Non-Hispanic African American: No     Diabetic: No     Tobacco smoker: No     Systolic Blood Pressure: 090 mmHg     Is BP treated: No     HDL Cholesterol: 56 mg/dL     Total Cholesterol: 214 mg/dL  Low cholesterol diet and exercise.  Follow lipid panel.       Right hip pain    Discussed ortho f/u.       Shoulder pain    Seeing ortho.       Stress    Increased stress as outlined.  On zoloft.  Continue. Follow.  Appears to be handling things relatively well.         Einar Pheasant, MD

## 2020-12-09 NOTE — Addendum Note (Signed)
Addended by: Larry Sierras on: 12/09/2020 11:39 AM   Modules accepted: Orders

## 2020-12-09 NOTE — Telephone Encounter (Signed)
Future labs ordered.  

## 2020-12-14 ENCOUNTER — Encounter: Payer: Self-pay | Admitting: Internal Medicine

## 2020-12-14 NOTE — Assessment & Plan Note (Signed)
Discussed ortho f/u.

## 2020-12-14 NOTE — Assessment & Plan Note (Signed)
Upper symptoms controlled on protonix.  

## 2020-12-14 NOTE — Assessment & Plan Note (Signed)
Increased stress as outlined.  On zoloft.  Continue. Follow.  Appears to be handling things relatively well.

## 2020-12-14 NOTE — Assessment & Plan Note (Signed)
Colonoscopy 12/13/19 - diverticulosis and internal hemorrhoids.  

## 2020-12-14 NOTE — Assessment & Plan Note (Signed)
Low carb diet and exercise.  Follow met b and a1c.  

## 2020-12-14 NOTE — Assessment & Plan Note (Signed)
Seeing ortho.   

## 2020-12-14 NOTE — Assessment & Plan Note (Signed)
Using cpap.  Residual AHI 5.7.  Seeing pulmonary.  Recommended she try nasal mask with chin strap to improve comfort.  Also discussed Inspire.

## 2021-01-05 DIAGNOSIS — G4733 Obstructive sleep apnea (adult) (pediatric): Secondary | ICD-10-CM | POA: Diagnosis not present

## 2021-02-05 DIAGNOSIS — G4733 Obstructive sleep apnea (adult) (pediatric): Secondary | ICD-10-CM | POA: Diagnosis not present

## 2021-03-08 ENCOUNTER — Ambulatory Visit: Payer: Medicare PPO | Admitting: Internal Medicine

## 2021-03-27 ENCOUNTER — Other Ambulatory Visit: Payer: Self-pay | Admitting: Internal Medicine

## 2021-04-05 ENCOUNTER — Other Ambulatory Visit (INDEPENDENT_AMBULATORY_CARE_PROVIDER_SITE_OTHER): Payer: Medicare PPO

## 2021-04-05 ENCOUNTER — Other Ambulatory Visit: Payer: Self-pay

## 2021-04-05 DIAGNOSIS — R739 Hyperglycemia, unspecified: Secondary | ICD-10-CM

## 2021-04-05 DIAGNOSIS — E7800 Pure hypercholesterolemia, unspecified: Secondary | ICD-10-CM

## 2021-04-05 LAB — LIPID PANEL
Cholesterol: 233 mg/dL — ABNORMAL HIGH (ref 0–200)
HDL: 59.7 mg/dL (ref >39.00)
LDL Cholesterol: 141 mg/dL — ABNORMAL HIGH (ref 0–99)
NonHDL: 173.19
Total CHOL/HDL Ratio: 4
Triglycerides: 163 mg/dL — ABNORMAL HIGH (ref 0.0–149.0)
VLDL: 32.6 mg/dL (ref 0.0–40.0)

## 2021-04-05 LAB — HEPATIC FUNCTION PANEL
ALT: 18 U/L (ref 0–35)
AST: 16 U/L (ref 0–37)
Albumin: 4.5 g/dL (ref 3.5–5.2)
Alkaline Phosphatase: 43 U/L (ref 39–117)
Bilirubin, Direct: 0.1 mg/dL (ref 0.0–0.3)
Total Bilirubin: 0.5 mg/dL (ref 0.2–1.2)
Total Protein: 7 g/dL (ref 6.0–8.3)

## 2021-04-05 LAB — BASIC METABOLIC PANEL
BUN: 19 mg/dL (ref 6–23)
CO2: 26 mEq/L (ref 19–32)
Calcium: 9.8 mg/dL (ref 8.4–10.5)
Chloride: 106 mEq/L (ref 96–112)
Creatinine, Ser: 0.8 mg/dL (ref 0.40–1.20)
GFR: 75.31 mL/min (ref >60.00)
Glucose, Bld: 97 mg/dL (ref 70–99)
Potassium: 4.2 mEq/L (ref 3.5–5.1)
Sodium: 141 mEq/L (ref 135–145)

## 2021-04-05 LAB — HEMOGLOBIN A1C: Hgb A1c MFr Bld: 6 % (ref 4.6–6.5)

## 2021-04-08 ENCOUNTER — Other Ambulatory Visit: Payer: Self-pay

## 2021-04-08 ENCOUNTER — Ambulatory Visit (INDEPENDENT_AMBULATORY_CARE_PROVIDER_SITE_OTHER): Payer: Medicare PPO | Admitting: Internal Medicine

## 2021-04-08 VITALS — BP 112/68 | HR 74 | Temp 97.8°F | Resp 16 | Ht 60.0 in | Wt 140.4 lb

## 2021-04-08 DIAGNOSIS — G4733 Obstructive sleep apnea (adult) (pediatric): Secondary | ICD-10-CM

## 2021-04-08 DIAGNOSIS — E7800 Pure hypercholesterolemia, unspecified: Secondary | ICD-10-CM

## 2021-04-08 DIAGNOSIS — F439 Reaction to severe stress, unspecified: Secondary | ICD-10-CM

## 2021-04-08 DIAGNOSIS — R739 Hyperglycemia, unspecified: Secondary | ICD-10-CM

## 2021-04-08 DIAGNOSIS — K219 Gastro-esophageal reflux disease without esophagitis: Secondary | ICD-10-CM | POA: Diagnosis not present

## 2021-04-08 DIAGNOSIS — M25511 Pain in right shoulder: Secondary | ICD-10-CM | POA: Diagnosis not present

## 2021-04-08 DIAGNOSIS — Z0000 Encounter for general adult medical examination without abnormal findings: Secondary | ICD-10-CM | POA: Diagnosis not present

## 2021-04-08 DIAGNOSIS — Z860100 Personal history of colon polyps, unspecified: Secondary | ICD-10-CM

## 2021-04-08 DIAGNOSIS — Z803 Family history of malignant neoplasm of breast: Secondary | ICD-10-CM

## 2021-04-08 NOTE — Assessment & Plan Note (Addendum)
Physical 04/08/21.  PAP 12/17/18 - negative with negative HPV.  Mammogram 12/08/20 - Birads I. Colonoscopy 12/13/19 - internal hemorrhoids and diverticulosis.

## 2021-04-08 NOTE — Progress Notes (Signed)
Patient ID: JALESSA PEYSER, female   DOB: April 04, 1950, 71 y.o.   MRN: [id number]   Subjective:    Patient ID: [name], female    DOB: [birth year], 71 y.o.   MRN: [id number]  This visit occurred during the SARS-CoV-2 public health emergency.  Safety protocols were in place, including screening questions prior to the visit, additional usage of staff PPE, and extensive cleaning of exam room while observing appropriate contact time as indicated for disinfecting solutions.   Patient here for her physical exam.   Chief Complaint  Patient presents with   Annual Exam   .   HPI Husband recently had surgery.  Some increased stress related.  Discussed.  Overall feels she is handling things relatively well.  She is having some issues with her right shoulder.  Increased pain when reaches around.  Has seen ortho previously - meloxicam and stretches.  Will notify me when desires referral back to ortho.  No chest pain or sob reported.  No abdominal pain or bowel change reported.  Family history of breast cancer, etc.  Discussed genetic counseling.     Past Medical History:  Diagnosis Date   Chronic constipation    Esophagitis    Gastritis    EGD (hiatal hernia)   GERD (gastroesophageal reflux disease)    Hypercholesterolemia    Sleep apnea    CPAP   Vitamin D deficiency    Past Surgical History:  Procedure Laterality Date   APPENDECTOMY  1976   Family History  Problem Relation Age of Onset   Colon cancer Father    Colon cancer Mother    COPD Mother    Heart disease Mother    Breast cancer Sister        died age 54   Breast cancer Maternal Uncle 34   Breast cancer Paternal Aunt 45   Social History   Socioeconomic History   Marital status: Married    Spouse name: Not on file   Number of children: 2   Years of education: Not on file   Highest education level: Not on file  Occupational History   Not on file  Tobacco Use   Smoking status: Never   Smokeless tobacco: Never   Substance and Sexual Activity   Alcohol use: Yes    Alcohol/week: 0.0 standard drinks    Comment: occasional   Drug use: No   Sexual activity: Not on file  Other Topics Concern   Not on file  Social History Narrative   Not on file   Social Determinants of Health   Financial Resource Strain: Low Risk    Difficulty of Paying Living Expenses: Not hard at all  Food Insecurity: No Food Insecurity   Worried About Charity fundraiser in the Last Year: Never true   [place] in the Last Year: Never true  Transportation Needs: No Transportation Needs   Lack of Transportation (Medical): No   Lack of Transportation (Non-Medical): No  Physical Activity: Unknown   Days of Exercise per Week: 0 days   Minutes of Exercise per Session: Not on file  Stress: No Stress Concern Present   Feeling of Stress : Not at all  Social Connections: Unknown   Frequency of Communication with Friends and Family: Not on file   Frequency of Social Gatherings with Friends and Family: Not on file   Attends Religious Services: Not on file   Active Member of Clubs or Organizations: Not on file  Attends Archivist Meetings: Not on file   Marital Status: Married    Review of Systems  Constitutional:  Negative for appetite change and unexpected weight change.  HENT:  Negative for congestion, sinus pressure and sore throat.   Eyes:  Negative for pain and visual disturbance.  Respiratory:  Negative for cough, chest tightness and shortness of breath.   Cardiovascular:  Negative for chest pain, palpitations and leg swelling.  Gastrointestinal:  Negative for abdominal pain, diarrhea, nausea and vomiting.  Genitourinary:  Negative for difficulty urinating and dysuria.  Musculoskeletal:  Negative for joint swelling and myalgias.       Right shoulder pain as outlined.    Skin:  Negative for color change and rash.  Neurological:  Negative for dizziness, light-headedness and headaches.  Hematological:   Negative for adenopathy. Does not bruise/bleed easily.  Psychiatric/Behavioral:  Negative for agitation and dysphoric mood.       Objective:     BP 112/68    Pulse 74    Temp 97.8 F (36.6 C)    Resp 16    Ht 5' (1.524 m)    Wt 140 lb 6.4 oz (63.7 kg)    LMP 06/04/2000    SpO2 98%    BMI 27.42 kg/m  Wt Readings from Last 3 Encounters:  04/08/21 140 lb 6.4 oz (63.7 kg)  12/07/20 138 lb (62.6 kg)  12/04/20 138 lb 3.2 oz (62.7 kg)    Physical Exam Vitals reviewed.  Constitutional:      General: She is not in acute distress.    Appearance: Normal appearance. She is well-developed.  HENT:     Head: Normocephalic and atraumatic.     Right Ear: External ear normal.     Left Ear: External ear normal.  Eyes:     General: No scleral icterus.       Right eye: No discharge.        Left eye: No discharge.     Conjunctiva/sclera: Conjunctivae normal.  Neck:     Thyroid: No thyromegaly.  Cardiovascular:     Rate and Rhythm: Normal rate and regular rhythm.  Pulmonary:     Effort: No tachypnea, accessory muscle usage or respiratory distress.     Breath sounds: Normal breath sounds. No decreased breath sounds or wheezing.  Chest:  Breasts:    Right: No inverted nipple, mass, nipple discharge or tenderness (no axillary adenopathy).     Left: No inverted nipple, mass, nipple discharge or tenderness (no axilarry adenopathy).  Abdominal:     General: Bowel sounds are normal.     Palpations: Abdomen is soft.     Tenderness: There is no abdominal tenderness.  Musculoskeletal:        General: No swelling or tenderness.     Cervical back: Neck supple. No tenderness.     Comments: Increased pain with abduction and reaching posteriorly - right arm.   Lymphadenopathy:     Cervical: No cervical adenopathy.  Skin:    Findings: No erythema or rash.  Neurological:     Mental Status: She is alert and oriented to person, place, and time.  Psychiatric:        Mood and Affect: Mood normal.         Behavior: Behavior normal.     Outpatient Encounter Medications as of 04/08/2021  Medication Sig   Cyanocobalamin 1000 MCG TBCR Take by mouth.   melatonin 5 MG TABS Take 5 mg by mouth at bedtime.  meloxicam (MOBIC) 7.5 MG tablet Take 7.5 mg by mouth daily as needed.    pantoprazole (PROTONIX) 20 MG tablet TAKE 1 TO 2 TABS BY MOUTH EVERY DAY   sertraline (ZOLOFT) 50 MG tablet TAKE 1 AND 1/2 TABLET PER DAY (Patient taking differently: Take 50 mg by mouth daily.)   VITAMIN D, CHOLECALCIFEROL, PO Take 1,000 Units by mouth daily.   No facility-administered encounter medications on file as of 04/08/2021.     Lab Results  Component Value Date   WBC 4.5 12/03/2020   HGB 12.7 12/03/2020   HCT 38.7 12/03/2020   PLT 254.0 12/03/2020   GLUCOSE 97 04/05/2021   CHOL 233 (H) 04/05/2021   TRIG 163.0 (H) 04/05/2021   HDL 59.70 04/05/2021   LDLDIRECT 146.0 09/23/2019   LDLCALC 141 (H) 04/05/2021   ALT 18 04/05/2021   AST 16 04/05/2021   NA 141 04/05/2021   K 4.2 04/05/2021   CL 106 04/05/2021   CREATININE 0.80 04/05/2021   BUN 19 04/05/2021   CO2 26 04/05/2021   TSH 3.77 07/30/2020   HGBA1C 6.0 04/05/2021    MM 3D SCREEN BREAST BILATERAL  Result Date: 12/08/2020 CLINICAL DATA:  Screening. EXAM: DIGITAL SCREENING BILATERAL MAMMOGRAM WITH TOMOSYNTHESIS AND CAD TECHNIQUE: Bilateral screening digital craniocaudal and mediolateral oblique mammograms were obtained. Bilateral screening digital breast tomosynthesis was performed. The images were evaluated with computer-aided detection. COMPARISON:  Previous exam(s). ACR Breast Density Category b: There are scattered areas of fibroglandular density. FINDINGS: There are no findings suspicious for malignancy. IMPRESSION: No mammographic evidence of malignancy. A result letter of this screening mammogram will be mailed directly to the patient. RECOMMENDATION: Screening mammogram in one year. (Code:SM-B-01Y) BI-RADS CATEGORY  1: Negative. Electronically  Signed   By: Valentino Saxon M.D.   On: 12/08/2020 13:19      Assessment & Plan:   Problem List Items Addressed This Visit     Family history of breast cancer    Family history of breast cancer.  Discussed genetic testing.  Refer to genetic counselor for further evaluation.        Relevant Orders   Ambulatory referral to Genetics   GERD (gastroesophageal reflux disease)    Upper symptoms controlled on protonix.        Health care maintenance    Physical 04/08/21.  PAP 12/17/18 - negative with negative HPV.  Mammogram 12/08/20 - Birads I. Colonoscopy 12/13/19 - internal hemorrhoids and diverticulosis.       History of colonic polyps    Colonoscopy 12/13/19 - diverticulosis and internal hemorrhoids.       Hyperglycemia    Low carb diet and exercise.  Follow met b and a1c.       Relevant Orders   Hemoglobin A1c   Obstructive sleep apnea    Continue cpap.       Pure hypercholesterolemia    The 10-year ASCVD risk score (Arnett DK, et al., 2019) is: 6.9%   Values used to calculate the score:     Age: 59 years     Sex: Female     Is Non-Hispanic African American: No     Diabetic: No     Tobacco smoker: No     Systolic Blood Pressure: 371 mmHg     Is BP treated: No     HDL Cholesterol: 59.7 mg/dL     Total Cholesterol: 233 mg/dL  Low cholesterol diet and exercise.  Follow lipid panel.       Relevant Orders  Basic metabolic panel   Lipid panel   Hepatic function panel   TSH   Shoulder pain    Persistent.  Will notify me if desires referral back to [facility].       Stress    Increased stress as outlined.  On zoloft.  Continue. Follow.  Appears to be handling things relatively well.       Other Visit Diagnoses     Routine general medical examination at a health care facility    -  Primary        Einar Pheasant, MD

## 2021-04-11 ENCOUNTER — Encounter: Payer: Self-pay | Admitting: Internal Medicine

## 2021-04-11 DIAGNOSIS — Z803 Family history of malignant neoplasm of breast: Secondary | ICD-10-CM | POA: Insufficient documentation

## 2021-04-11 NOTE — Assessment & Plan Note (Signed)
Colonoscopy 12/13/19 - diverticulosis and internal hemorrhoids.  

## 2021-04-11 NOTE — Assessment & Plan Note (Signed)
Increased stress as outlined.  On zoloft.  Continue. Follow.  Appears to be handling things relatively well.  

## 2021-04-11 NOTE — Assessment & Plan Note (Signed)
Persistent.  Will notify me if desires referral back to [facility].

## 2021-04-11 NOTE — Assessment & Plan Note (Signed)
The 10-year ASCVD risk score (Arnett DK, et al., 2019) is: 6.9%   Values used to calculate the score:     Age: 71 years     Sex: Female     Is Non-Hispanic African American: No     Diabetic: No     Tobacco smoker: No     Systolic Blood Pressure: 112 mmHg     Is BP treated: No     HDL Cholesterol: 59.7 mg/dL     Total Cholesterol: 233 mg/dL  Low cholesterol diet and exercise.  Follow lipid panel.

## 2021-04-11 NOTE — Assessment & Plan Note (Signed)
Family history of breast cancer.  Discussed genetic testing.  Refer to genetic counselor for further evaluation.

## 2021-04-11 NOTE — Assessment & Plan Note (Signed)
Low carb diet and exercise.  Follow met b and a1c.

## 2021-04-11 NOTE — Assessment & Plan Note (Signed)
Continue cpap.  

## 2021-04-11 NOTE — Assessment & Plan Note (Signed)
Upper symptoms controlled on protonix.  

## 2021-04-16 DIAGNOSIS — M5416 Radiculopathy, lumbar region: Secondary | ICD-10-CM | POA: Diagnosis not present

## 2021-04-16 DIAGNOSIS — S39012A Strain of muscle, fascia and tendon of lower back, initial encounter: Secondary | ICD-10-CM | POA: Diagnosis not present

## 2021-04-19 DIAGNOSIS — D2261 Melanocytic nevi of right upper limb, including shoulder: Secondary | ICD-10-CM | POA: Diagnosis not present

## 2021-04-19 DIAGNOSIS — D2271 Melanocytic nevi of right lower limb, including hip: Secondary | ICD-10-CM | POA: Diagnosis not present

## 2021-04-19 DIAGNOSIS — D225 Melanocytic nevi of trunk: Secondary | ICD-10-CM | POA: Diagnosis not present

## 2021-04-19 DIAGNOSIS — X32XXXA Exposure to sunlight, initial encounter: Secondary | ICD-10-CM | POA: Diagnosis not present

## 2021-04-19 DIAGNOSIS — L570 Actinic keratosis: Secondary | ICD-10-CM | POA: Diagnosis not present

## 2021-04-19 DIAGNOSIS — L821 Other seborrheic keratosis: Secondary | ICD-10-CM | POA: Diagnosis not present

## 2021-04-19 DIAGNOSIS — D2262 Melanocytic nevi of left upper limb, including shoulder: Secondary | ICD-10-CM | POA: Diagnosis not present

## 2021-04-19 DIAGNOSIS — D2272 Melanocytic nevi of left lower limb, including hip: Secondary | ICD-10-CM | POA: Diagnosis not present

## 2021-04-19 DIAGNOSIS — D485 Neoplasm of uncertain behavior of skin: Secondary | ICD-10-CM | POA: Diagnosis not present

## 2021-04-20 ENCOUNTER — Inpatient Hospital Stay: Payer: Medicare PPO

## 2021-04-20 ENCOUNTER — Inpatient Hospital Stay: Payer: Medicare PPO | Admitting: Licensed Clinical Social Worker

## 2021-05-11 ENCOUNTER — Ambulatory Visit: Payer: Medicare PPO | Admitting: Internal Medicine

## 2021-05-18 ENCOUNTER — Inpatient Hospital Stay: Payer: Medicare PPO | Admitting: Licensed Clinical Social Worker

## 2021-05-18 ENCOUNTER — Inpatient Hospital Stay: Payer: Medicare PPO

## 2021-05-26 ENCOUNTER — Other Ambulatory Visit: Payer: Self-pay

## 2021-05-26 ENCOUNTER — Encounter: Payer: Self-pay | Admitting: Licensed Clinical Social Worker

## 2021-05-26 ENCOUNTER — Inpatient Hospital Stay: Payer: Medicare PPO

## 2021-05-26 ENCOUNTER — Inpatient Hospital Stay: Payer: Medicare PPO | Attending: Oncology | Admitting: Licensed Clinical Social Worker

## 2021-05-26 DIAGNOSIS — Z803 Family history of malignant neoplasm of breast: Secondary | ICD-10-CM

## 2021-05-26 DIAGNOSIS — Z800 Family history of malignant neoplasm of digestive organs: Secondary | ICD-10-CM | POA: Diagnosis not present

## 2021-05-26 NOTE — Progress Notes (Signed)
REFERRING PROVIDER: Einar Pheasant, [address] [place],  [place] [address]  PRIMARY PROVIDER:  Einar Pheasant, MD  PRIMARY REASON FOR VISIT:  1. Family history of breast cancer   2. Family history of colon cancer      HISTORY OF PRESENT ILLNESS:   Ms. Riso, a 71 y.o. female, was seen for a [facility] cancer genetics consultation at the request of Dr. Nicki Reaper due to a family history of cancer.  Ms. Amezcua presents to clinic today to discuss the possibility of a hereditary predisposition to cancer, genetic testing, and to further clarify her future cancer risks, as well as potential cancer risks for family members.    Ms. Tamayo is a 71 y.o. female with no personal history of cancer.    CANCER HISTORY:  Oncology History   No history exists.     RISK FACTORS:  Menarche was at age 55.  First live birth at age 54.  OCP use for approximately  15  years.  Ovaries intact: yes.  Hysterectomy: no.  Menopausal status: postmenopausal.  HRT use: 0 years. Colonoscopy: yes;  polyps, less than 10 . Mammogram within the last year: yes. Number of breast biopsies: 0. Up to date with pelvic exams: yes.  Past Medical History:  Diagnosis Date   Chronic constipation    Esophagitis    Family history of breast cancer    Family history of colon cancer    Gastritis    EGD (hiatal hernia)   GERD (gastroesophageal reflux disease)    Hypercholesterolemia    Sleep apnea    CPAP   Vitamin D deficiency     Past Surgical History:  Procedure Laterality Date   APPENDECTOMY  1976    Social History   Socioeconomic History   Marital status: Married    Spouse name: Not on file   Number of children: 2   Years of education: Not on file   Highest education level: Not on file  Occupational History   Not on file  Tobacco Use   Smoking status: Never   Smokeless tobacco: Never  Substance and Sexual Activity   Alcohol use: Yes    Alcohol/week: 0.0  standard drinks    Comment: occasional   Drug use: No   Sexual activity: Not on file  Other Topics Concern   Not on file  Social History Narrative   Not on file   Social Determinants of Health   Financial Resource Strain: Low Risk    Difficulty of Paying Living Expenses: Not hard at all  Food Insecurity: No Food Insecurity   Worried About Charity fundraiser in the Last Year: Never true   [place] in the Last Year: Never true  Transportation Needs: No Transportation Needs   Lack of Transportation (Medical): No   Lack of Transportation (Non-Medical): No  Physical Activity: Unknown   Days of Exercise per Week: 0 days   Minutes of Exercise per Session: Not on file  Stress: No Stress Concern Present   Feeling of Stress : Not at all  Social Connections: Unknown   Frequency of Communication with Friends and Family: Not on file   Frequency of Social Gatherings with Friends and Family: Not on file   Attends Religious Services: Not on file   Active Member of Clubs or Organizations: Not on file   Attends Archivist Meetings: Not on file   Marital Status: Married     FAMILY HISTORY:  We  obtained a detailed, 4-generation family history.  Significant diagnoses are listed below: Family History  Problem Relation Age of Onset   Colon cancer Mother 53   COPD Mother    Heart disease Mother    Colon cancer Father        dx 8s   Breast cancer Sister 47       died age 55   Breast cancer Maternal Uncle        dx 40s   Breast cancer Paternal 68    Breast cancer Paternal Aunt    Ms. Feil has 1 son, 69, and 1 daughter, 48. She had 1 sister and 1 brother. Her sister had breast cancer at 35 and died at 26.   Ms. Rincon mother had colon cancer at 5 and died at 34. Patient had 2 maternal uncles. One had breast cancer in his 74s and died in his late 58s-80s. Maternal grandmother died at 51. Grandfather died in his 63s.  Ms. Neuzil father had colon cancer  in his 80s. She has limited information about this side of the family, but is aware of 2 paternal aunts who had breast cancer, unsure ages of diagnosis.   Ms. Mcquade is unaware of previous family history of genetic testing for hereditary cancer risks. Patient's maternal ancestors are of unknown descent, and paternal ancestors are of unknown descent. There is no reported Ashkenazi Jewish ancestry. There is no known consanguinity.    GENETIC COUNSELING ASSESSMENT: Ms. Steinbach is a 71 y.o. female with a family history of breast cancer which is somewhat suggestive of a hereditary cancer syndrome and predisposition to cancer. We, therefore, discussed and recommended the following at today's visit.   DISCUSSION: We discussed that approximately 10% of breast cancer is hereditary. Most cases of hereditary breast cancer are associated with BRCA1/BRCA2 genes, although there are other genes associated with hereditary cancer as well. Cancers and risks are gene specific.  We discussed that testing is beneficial for several reasons including knowing about cancer risks, identifying potential screening and risk-reduction options that may be appropriate, and to understand if other family members could be at risk for cancer and allow them to undergo genetic testing.   We reviewed the characteristics, features and inheritance patterns of hereditary cancer syndromes. We also discussed genetic testing, including the appropriate family members to test, the process of testing, insurance coverage and turn-around-time for results. We discussed the implications of a negative, positive and/or variant of uncertain significant result. We recommended Ms. Ida pursue genetic testing for the [facility] Multi-Cancer+RNA gene panel.   The Multi-Cancer Panel + RNA offered by [facility] includes sequencing and/or deletion duplication testing of the following 84 genes: AIP, ALK, APC, ATM, AXIN2,BAP1,  BARD1, BLM, BMPR1A, BRCA1,  BRCA2, BRIP1, CASR, CDC73, CDH1, CDK4, CDKN1B, CDKN1C, CDKN2A (p14ARF), CDKN2A (p16INK4a), CEBPA, CHEK2, CTNNA1, DICER1, DIS3L2, EGFR (c.2369C>T, p.Thr790Met variant only), EPCAM (Deletion/duplication testing only), FH, FLCN, GATA2, GPC3, GREM1 (Promoter region deletion/duplication testing only), HOXB13 (c.251G>A, p.Gly84Glu), HRAS, KIT, MAX, MEN1, MET, MITF (c.952G>A, p.Glu318Lys variant only), MLH1, MSH2, MSH3, MSH6, MUTYH, NBN, NF1, NF2, NTHL1, PALB2, PDGFRA, PHOX2B, PMS2, POLD1, POLE, POT1, PRKAR1A, PTCH1, PTEN, RAD50, RAD51C, RAD51D, RB1, RECQL4, RET, RUNX1, SDHAF2, SDHA (sequence changes only), SDHB, SDHC, SDHD, SMAD4, SMARCA4, SMARCB1, SMARCE1, STK11, SUFU, TERC, TERT, TMEM127, TP53, TSC1, TSC2, VHL, WRN and WT1.  Based on Ms. Toren's family history of cancer, she meets medical criteria for genetic testing. Despite that she meets criteria, she may still have an out of pocket cost. We  discussed that if her out of pocket cost for testing is over $100, the laboratory will call and confirm whether she wants to proceed with testing.  If the out of pocket cost of testing is less than $100 she will be billed by the genetic testing laboratory.   PLAN: After considering the risks, benefits, and limitations, Ms. Borunda provided informed consent to pursue genetic testing and the blood sample was sent to [facility] for analysis of the Multi-Cancer+RNA panel. Results should be available within approximately 2-3 weeks' time, at which point they will be disclosed by telephone to Ms. Peregoy, as will any additional recommendations warranted by these results. Ms. Hayden will receive a summary of her genetic counseling visit and a copy of her results once available. This information will also be available in Epic.   Ms. Waldeck questions were answered to her satisfaction today. Our contact information was provided should additional questions or concerns arise. Thank you for the referral and  allowing [place] to share in the care of your patient.   Faith Rogue, MS, [facility] Genetic Counselor [email or web address] Phone: [phone]  The patient was seen for a total of 30 minutes in face-to-face genetic counseling.Dr. Grayland Ormond was available for discussion regarding this case.   _______________________________________________________________________ For Office Staff:  Number of people involved in session: 1 Was an Intern/ student involved with case: no

## 2021-05-30 NOTE — Progress Notes (Deleted)
II/10/21- 69 yoF for sleep evaluation due to OSA ?Medical problem list includes GERD, Hypercholesterolemia,  ?Previous limited experience with CPAP- poorly tolerated in 2013 with AHI 12.4 at that time.  ?HST 11/18/19 AHI 33/ hr, desaturation to 68%, body weight 138 lbs ?Had CPAP titration at [facility] recommending CPAP auto 6-9, small nasal pillows.  ?Seen initially by Clent Ridges, NP on 10/28/19 on referral from Dr Dale Clifton with c/o morning fatigue, snoring, restless sleep. Melatonin 3 mg hs.  ?She wanted to discuss options with physician. ?Body weight-138 lbs ?Epworth score- ?Covid vax- 3 Phizer ?Flu vax- had ?Neg family hx OSA. She has awakened with chest pressure/ anxious- suspects this was apnea but she has cardiology eval pending. Denies personal hx heart/ lung disease. Husband tells her of noisy breathing/ ?snoring. Not much aware of daytime sleepiness- admits occasionally would like a nap. Minimizes impact on alertness/ driving.  2 cups AM 1/2 caff. Melatonin 3 mg at hs. ?ENT surgery+ tonsils. ?Retired Runner, broadcasting/film/video.  ? ?06/01/21- 70 yoF followed for OSA, complicated by GERD, Hypercholesterolemia,  ?CPAP auto 5-15/ [facility] ordered 06/09/20 ?Download- ?Body weight today- ?Covid vax- ?Flu vax- ? ? ? ?ROS-see HPI   + = positive ?Constitutional:    weight loss, night sweats, fevers, chills, fatigue, lassitude. ?HEENT:    headaches, difficulty swallowing, tooth/dental problems, sore throat,  ?     sneezing, itching, ear ache, nasal congestion, post nasal drip, snoring ?CV:    chest pain, orthopnea, PND, swelling in lower extremities, anasarca,                                   ?dizziness, palpitations ?Resp:   shortness of breath with exertion or at rest.   ?             productive cough,   non-productive cough, coughing up of blood.   ?           change in color of mucus.  wheezing.   ?Skin:    rash or lesions. ?GI:  No-   heartburn, indigestion, abdominal pain, nausea, vomiting, diarrhea,  ?               change in bowel habits,  loss of appetite ?GU: dysuria, change in color of urine, no urgency or frequency.   flank pain. ?MS:   joint pain, stiffness, decreased range of motion, back pain. ?Neuro-     nothing unusual ?Psych:  change in mood or affect.  depression or anxiety.   memory loss. ? ?OBJ- Physical Exam ?General- Alert, Oriented, Affect-appropriate, Distress- none acute, + not obese ?Skin- rash-none, lesions- none, excoriation- none ?Lymphadenopathy- none ?Head- atraumatic ?           Eyes- Gross vision intact, PERRLA, conjunctivae and secretions clear ?           Ears- Hearing, canals-normal ?           Nose- Clear, no-Septal dev, mucus, polyps, erosion, perforation  ?           Throat- Mallampati IV , mucosa clear , drainage- none, tonsils absent, +teeth ?Neck- flexible , trachea midline, no stridor , thyroid nl, carotid no bruit ?Chest - symmetrical excursion , unlabored ?          Heart/CV- RRR , no murmur , no gallop  , no rub, nl s1 s2 ?                          -  JVD- none , edema- none, stasis changes- none, varices- none ?          Lung- clear to P&A, wheeze- none, cough- none , dullness-none, rub- none ?          Chest wall-  ?Abd-  ?Br/ Gen/ Rectal- Not done, not indicated ?Extrem- cyanosis- none, clubbing, none, atrophy- none, strength- nl ?Neuro- grossly intact to observation ? ? ?

## 2021-06-01 ENCOUNTER — Ambulatory Visit: Payer: Medicare PPO | Admitting: Internal Medicine

## 2021-06-01 ENCOUNTER — Telehealth (INDEPENDENT_AMBULATORY_CARE_PROVIDER_SITE_OTHER): Payer: Medicare PPO | Admitting: Internal Medicine

## 2021-06-01 ENCOUNTER — Telehealth: Payer: Self-pay | Admitting: Internal Medicine

## 2021-06-01 ENCOUNTER — Encounter: Payer: Self-pay | Admitting: Internal Medicine

## 2021-06-01 DIAGNOSIS — F439 Reaction to severe stress, unspecified: Secondary | ICD-10-CM | POA: Diagnosis not present

## 2021-06-01 DIAGNOSIS — M5441 Lumbago with sciatica, right side: Secondary | ICD-10-CM | POA: Diagnosis not present

## 2021-06-01 MED ORDER — METHYLPREDNISOLONE 4 MG PO TBPK: ORAL_TABLET | ORAL | 0 refills | Status: DC

## 2021-06-01 NOTE — Progress Notes (Unsigned)
Patient ID: Jessica Peck, female   DOB: 02-Nov-1950, 71 y.o.   MRN: [id number]   Virtual Visit via *** Note  This visit type was conducted due to national recommendations for restrictions regarding the COVID-19 pandemic (e.g. social distancing).  This format is felt to be most appropriate for this patient at this time.  All issues noted in this document were discussed and addressed.  No physical exam was performed (except for noted visual exam findings with Video Visits).   I connected withNAME@ today at  4:30 PM EST by a video enabled telemedicine application or telephone and verified that I am speaking with the correct person using two identifiers. Location patient: home Location provider: work or home office Persons participating in the virtual visit: patient, provider  I discussed the limitations, risks, security and privacy concerns of performing an evaluation and management service by telephone and the availability of in person appointments. I also discussed with the patient that there may be a patient responsible charge related to this service. The patient expressed understanding and agreed to proceed.  Interactive audio and video telecommunications were attempted between this provider and patient, however failed, due to patient having technical difficulties OR patient did not have access to video capability.  We continued and completed visit with audio only. ***  Reason for visit: ***  HPI: ***   ROS: See pertinent positives and negatives per HPI.  Past Medical History:  Diagnosis Date   Chronic constipation    Esophagitis    Family history of breast cancer    Family history of colon cancer    Gastritis    EGD (hiatal hernia)   GERD (gastroesophageal reflux disease)    Hypercholesterolemia    Sleep apnea    CPAP   Vitamin D deficiency     Past Surgical History:  Procedure Laterality Date   APPENDECTOMY  1976    Family History  Problem Relation Age of Onset    Colon cancer Mother 57   COPD Mother    Heart disease Mother    Colon cancer Father        dx 59s   Breast cancer Sister 21       died age 56   Breast cancer Maternal Uncle        dx 74s   Breast cancer Paternal Aunt    Breast cancer Paternal Aunt     SOCIAL HX: ***   Current Outpatient Medications:    Cyanocobalamin 1000 MCG TBCR, Take by mouth., Disp: , Rfl:    melatonin 5 MG TABS, Take 5 mg by mouth at bedtime., Disp: , Rfl:    meloxicam (MOBIC) 7.5 MG tablet, Take 7.5 mg by mouth daily as needed. , Disp: , Rfl:    pantoprazole (PROTONIX) 20 MG tablet, TAKE 1 TO 2 TABS BY MOUTH EVERY DAY, Disp: 180 tablet, Rfl: 1   sertraline (ZOLOFT) 50 MG tablet, TAKE 1 AND 1/2 TABLET PER DAY (Patient taking differently: Take 50 mg by mouth daily.), Disp: 135 tablet, Rfl: 1   VITAMIN D, CHOLECALCIFEROL, PO, Take 1,000 Units by mouth daily., Disp: , Rfl:   EXAM:  VITALS per patient if applicable:  GENERAL: alert, oriented, appears well and in no acute distress  HEENT: atraumatic, conjunttiva clear, no obvious abnormalities on inspection of external nose and ears  NECK: normal movements of the head and neck  LUNGS: on inspection no signs of respiratory distress, breathing rate appears normal, no obvious gross SOB, gasping or wheezing  CV: no obvious cyanosis  MS: moves all visible extremities without noticeable abnormality  PSYCH/NEURO: pleasant and cooperative, no obvious depression or anxiety, speech and thought processing grossly intact  ASSESSMENT AND PLAN:  Discussed the following assessment and plan:  Problem List Items Addressed This Visit   None   No follow-ups on file.   I discussed the assessment and treatment plan with the patient. The patient was provided an opportunity to ask questions and all were answered. The patient agreed with the plan and demonstrated an understanding of the instructions.   The patient was advised to call back or seek an in-person evaluation  if the symptoms worsen or if the condition fails to improve as anticipated.  I provided *** minutes of non-face-to-face time during this encounter.   Einar Pheasant, MD

## 2021-06-01 NOTE — Telephone Encounter (Signed)
Pt scheduled  

## 2021-06-01 NOTE — Telephone Encounter (Signed)
Yes, you can add her on today.  ?

## 2021-06-01 NOTE — Telephone Encounter (Signed)
Patient says that she is having pain in the SI joint. She has issues with the disc in her back. Wanting to know if she can do a round of prednisone because she is getting on a plane to go to [facility] in the morning for 6 days and is planning to see Dr Martha Clan when she comes back. She has tramadol to use for pain but does not want to use this while she is out of town. She did have to take one today. Do you want me to add her on so you can discuss? ? ?

## 2021-06-01 NOTE — Telephone Encounter (Signed)
Pt called in stating that she having some back issues. Pt stated that she will be traveling tomorrow. Pt stated that she may need medication to up. Pt requesting callback  ?

## 2021-06-01 NOTE — Telephone Encounter (Signed)
See my chart message

## 2021-06-08 ENCOUNTER — Encounter: Payer: Self-pay | Admitting: Internal Medicine

## 2021-06-08 DIAGNOSIS — M549 Dorsalgia, unspecified: Secondary | ICD-10-CM | POA: Insufficient documentation

## 2021-06-08 NOTE — Assessment & Plan Note (Signed)
Back pain as outlined.  Increased.  No known injury.  Has seen ortho previously.  Treat with medrol dosepack as directed.  Avoid antiinflammatories while on steroids.  Planning to f/u with ortho once she returns from [place].   ?

## 2021-06-08 NOTE — Assessment & Plan Note (Signed)
Continues on zoloft.  Follow.  

## 2021-06-10 DIAGNOSIS — M5416 Radiculopathy, lumbar region: Secondary | ICD-10-CM | POA: Diagnosis not present

## 2021-06-10 DIAGNOSIS — M533 Sacrococcygeal disorders, not elsewhere classified: Secondary | ICD-10-CM | POA: Diagnosis not present

## 2021-06-15 ENCOUNTER — Ambulatory Visit: Payer: Self-pay | Admitting: Licensed Clinical Social Worker

## 2021-06-15 ENCOUNTER — Encounter: Payer: Self-pay | Admitting: Licensed Clinical Social Worker

## 2021-06-15 ENCOUNTER — Telehealth: Payer: Self-pay | Admitting: Licensed Clinical Social Worker

## 2021-06-15 DIAGNOSIS — Z1379 Encounter for other screening for genetic and chromosomal anomalies: Secondary | ICD-10-CM

## 2021-06-15 DIAGNOSIS — R293 Abnormal posture: Secondary | ICD-10-CM | POA: Diagnosis not present

## 2021-06-15 DIAGNOSIS — M5459 Other low back pain: Secondary | ICD-10-CM | POA: Diagnosis not present

## 2021-06-15 NOTE — Telephone Encounter (Signed)
Revealed negative genetic testing.  Revealed that a VUS in BRIP1 and MUTYH  was identified. This normal result is reassuring.  It is unlikely that there is an increased risk of cancer due to a mutation in one of these genes.  However, genetic testing is not perfect, and cannot definitively rule out a hereditary cause.  It will be important for her to keep in contact with genetics to learn if any additional testing may be needed in the future.    ? ? ?

## 2021-06-15 NOTE — Progress Notes (Signed)
HPI:  [name] was previously seen in the [facility] clinic due to a family history of cancer and concerns regarding a hereditary predisposition to cancer. Please refer to our prior cancer genetics clinic note for more information regarding our discussion, assessment and recommendations, at the time. [name] recent genetic test results were disclosed to her, as were recommendations warranted by these results. These results and recommendations are discussed in more detail below. ? ?CANCER HISTORY:  ?Oncology History  ? No history exists.  ? ? ?FAMILY HISTORY:  ?We obtained a detailed, 4-generation family history.  Significant diagnoses are listed below: ?Family History  ?Problem Relation Age of Onset  ? Colon cancer Mother 74  ? COPD Mother   ? Heart disease Mother   ? Colon cancer Father   ?     dx 37s  ? Breast cancer Sister 38  ?     died age 58  ? Breast cancer Maternal Uncle   ?     dx 43s  ? Breast cancer Paternal Aunt   ? Breast cancer Paternal Aunt   ? ? ?[name] has 1 son, 46, and 1 daughter, 70. She had 1 sister and 1 brother. Her sister had breast cancer at 32 and died at 81.  ?  ?[name]'s mother had colon cancer at 65 and died at 72. Patient had 2 maternal uncles. One had breast cancer in his 28s and died in his late 46s-80s. Maternal grandmother died at 69. Grandfather died in his 68s. ?  ?[name]'s father had colon cancer in his 33s. She has limited information about this side of the family, but is aware of 2 paternal aunts who had breast cancer, unsure ages of diagnosis.  ?  ?[name] is unaware of previous family history of genetic testing for hereditary cancer risks. Patient's maternal ancestors are of unknown descent, and paternal ancestors are of unknown descent. There is no reported Ashkenazi Jewish ancestry. There is no known consanguinity. ?  ? ? ?GENETIC TEST RESULTS: Genetic testing reported out on 06/14/2021 through the [facility]  Multi-Cancer+RNA cancer panel found no pathogenic mutations.  ? ?The Multi-Cancer Panel + RNA offered by [facility] includes sequencing and/or deletion duplication testing of the following 84 genes: AIP, ALK, APC, ATM, AXIN2,BAP1,  BARD1, BLM, BMPR1A, BRCA1, BRCA2, BRIP1, CASR, CDC73, CDH1, CDK4, CDKN1B, CDKN1C, CDKN2A (p14ARF), CDKN2A (p16INK4a), CEBPA, CHEK2, CTNNA1, DICER1, DIS3L2, EGFR (c.2369C>T, p.Thr790Met variant only), EPCAM (Deletion/duplication testing only), FH, FLCN, GATA2, GPC3, GREM1 (Promoter region deletion/duplication testing only), HOXB13 (c.251G>A, p.Gly84Glu), HRAS, KIT, MAX, MEN1, MET, MITF (c.952G>A, p.Glu318Lys variant only), MLH1, MSH2, MSH3, MSH6, MUTYH, NBN, NF1, NF2, NTHL1, PALB2, PDGFRA, PHOX2B, PMS2, POLD1, POLE, POT1, PRKAR1A, PTCH1, PTEN, RAD50, RAD51C, RAD51D, RB1, RECQL4, RET, RUNX1, SDHAF2, SDHA (sequence changes only), SDHB, SDHC, SDHD, SMAD4, SMARCA4, SMARCB1, SMARCE1, STK11, SUFU, TERC, TERT, TMEM127, TP53, TSC1, TSC2, VHL, WRN and WT1. ? ?The test report has been scanned into EPIC and is located under the Molecular Pathology section of the Results Review tab.  A portion of the result report is included below for reference.  ? ? ? ?We discussed that because current genetic testing is not perfect, it is possible there may be a gene mutation in one of these genes that current testing cannot detect, but that chance is small.  There could be another gene that has not yet been discovered, or that we have not yet tested, that is responsible for the cancer diagnoses in the family. It  is also possible there is a hereditary cause for the cancer in the family that [name] did not inherit and therefore was not identified in her testing.  Therefore, it is important to remain in touch with cancer genetics in the future so that we can continue to offer [name] the most up to date genetic testing.  ? ?Genetic testing did identify 2 Variants of uncertain significance (VUS) - one in  the BRIP1 gene called c.413T>C, a second in the MUTYH gene called c.1508G>A. At this time, it is unknown if these variants are associated with increased cancer risk or if they are normal findings, but most variants such as these get reclassified to being inconsequential. They should not be used to make medical management decisions. With time, we suspect the lab will determine the significance of these variants, if any. If we do learn more about them, we will try to contact [name] to discuss it further. However, it is important to stay in touch with [place] periodically and keep the address and phone number up to date. ? ?ADDITIONAL GENETIC TESTING: We discussed with [name] that her genetic testing was fairly extensive.  If there are genes identified to increase cancer risk that can be analyzed in the future, we would be happy to discuss and coordinate this testing at that time.   ? ?CANCER SCREENING RECOMMENDATIONS: [name] test result is considered negative (normal).  This means that we have not identified a hereditary cause for her family history of cancer at this time.  ? ?While reassuring, this does not definitively rule out a hereditary predisposition to cancer. It is still possible that there could be genetic mutations that are undetectable by current technology. There could be genetic mutations in genes that have not been tested or identified to increase cancer risk.  Therefore, it is recommended she continue to follow the cancer management and screening guidelines provided by her primary healthcare provider.  ? ?An individual's cancer risk and medical management are not determined by genetic test results alone. Overall cancer risk assessment incorporates additional factors, including personal medical history, family history, and any available genetic information that may result in a personalized plan for cancer prevention and surveillance. ? ?Based on [name]'s personal and family  history of cancer as well as her genetic test results, risk model Harriett Rush was used to estimate her risk of developing breast cancer. This estimates her lifetime risk of developing breast cancer to be approximately 10%.  The patient's lifetime breast cancer risk is a preliminary estimate based on available information using one of several models endorsed by the [employer]  (NCCN). The NCCN recommends consideration of breast MRI screening as an adjunct to mammography for patients at high risk (defined as 20% or greater lifetime risk).  This risk estimate can change over time and may be repeated to reflect new information in her personal or family history in the future. ? ? ? ?RECOMMENDATIONS FOR FAMILY MEMBERS:  Relatives in this family might be at some increased risk of developing cancer, over the general population risk, simply due to the family history of cancer.  We recommended female relatives in this family have a yearly mammogram beginning at age 53, or 31 years younger than the earliest onset of cancer, an annual clinical breast exam, and perform monthly breast self-exams. Female relatives in this family should also have a gynecological exam as recommended by their primary provider.  All family members should be referred for  colonoscopy starting at age 12.  ? ? It is also possible there is a hereditary cause for the cancer in [name]'s family that she did not inherit and therefore was not identified in her.  Based on [name]'s family history, we recommended her brother/those related to her sister have genetic counseling and testing. [name] will let us know if we can be of any assistance in coordinating genetic counseling and/or testing for these family members. ? ?FOLLOW-UP: Lastly, we discussed with [name] that cancer genetics is a rapidly advancing field and it is possible that new genetic tests will be appropriate for her and/or her family members  in the future. We encouraged her to remain in contact with cancer genetics on an annual basis so we can update her personal and family histories and let her know of advances in cancer genetics that may bene

## 2021-07-02 ENCOUNTER — Ambulatory Visit: Payer: Medicare PPO

## 2021-07-02 ENCOUNTER — Ambulatory Visit: Payer: Medicare PPO | Admitting: Internal Medicine

## 2021-07-16 ENCOUNTER — Ambulatory Visit: Payer: Medicare PPO

## 2021-07-22 DIAGNOSIS — M7061 Trochanteric bursitis, right hip: Secondary | ICD-10-CM | POA: Diagnosis not present

## 2021-07-26 ENCOUNTER — Ambulatory Visit: Payer: Medicare PPO | Admitting: Internal Medicine

## 2021-07-27 ENCOUNTER — Other Ambulatory Visit (INDEPENDENT_AMBULATORY_CARE_PROVIDER_SITE_OTHER): Payer: Medicare PPO

## 2021-07-27 DIAGNOSIS — R739 Hyperglycemia, unspecified: Secondary | ICD-10-CM | POA: Diagnosis not present

## 2021-07-27 DIAGNOSIS — E7800 Pure hypercholesterolemia, unspecified: Secondary | ICD-10-CM

## 2021-07-27 LAB — BASIC METABOLIC PANEL
BUN: 17 mg/dL (ref 6–23)
CO2: 26 mEq/L (ref 19–32)
Calcium: 10 mg/dL (ref 8.4–10.5)
Chloride: 104 mEq/L (ref 96–112)
Creatinine, Ser: 1.07 mg/dL (ref 0.40–1.20)
GFR: 53.01 mL/min — ABNORMAL LOW (ref >60.00)
Glucose, Bld: 98 mg/dL (ref 70–99)
Potassium: 4.1 mEq/L (ref 3.5–5.1)
Sodium: 139 mEq/L (ref 135–145)

## 2021-07-27 LAB — HEPATIC FUNCTION PANEL
ALT: 18 U/L (ref 0–35)
AST: 15 U/L (ref 0–37)
Albumin: 4.7 g/dL (ref 3.5–5.2)
Alkaline Phosphatase: 47 U/L (ref 39–117)
Bilirubin, Direct: 0.1 mg/dL (ref 0.0–0.3)
Total Bilirubin: 0.4 mg/dL (ref 0.2–1.2)
Total Protein: 7.2 g/dL (ref 6.0–8.3)

## 2021-07-27 LAB — LDL CHOLESTEROL, DIRECT: Direct LDL: 135 mg/dL

## 2021-07-27 LAB — LIPID PANEL
Cholesterol: 236 mg/dL — ABNORMAL HIGH (ref 0–200)
HDL: 62.9 mg/dL (ref >39.00)
NonHDL: 173.13
Total CHOL/HDL Ratio: 4
Triglycerides: 273 mg/dL — ABNORMAL HIGH (ref 0.0–149.0)
VLDL: 54.6 mg/dL — ABNORMAL HIGH (ref 0.0–40.0)

## 2021-07-27 LAB — TSH: TSH: 3.21 u[IU]/mL (ref 0.35–5.50)

## 2021-07-27 LAB — HEMOGLOBIN A1C: Hgb A1c MFr Bld: 5.8 % (ref 4.6–6.5)

## 2021-08-02 ENCOUNTER — Telehealth: Payer: Self-pay | Admitting: Internal Medicine

## 2021-08-02 NOTE — Telephone Encounter (Signed)
Copied from CRM [id number]. Topic: Medicare AWV ?>> Aug 02, 2021 11:53 AM Harris-Coley, Avon Gully wrote: ?Reason for CRM: LMV 08/02/21 to change AWV time on 08/06/21 from 12:45 to 2pm.  Please confirm appt time change khc ?

## 2021-08-03 ENCOUNTER — Other Ambulatory Visit: Payer: Medicare PPO

## 2021-08-04 ENCOUNTER — Encounter: Payer: Self-pay | Admitting: Internal Medicine

## 2021-08-05 NOTE — Progress Notes (Signed)
II/10/21- 69 yoF for sleep evaluation due to OSA ?Medical problem list includes GERD, Hypercholesterolemia,  ?Previous limited experience with CPAP- poorly tolerated in 2013 with AHI 12.4 at that time.  ?HST 11/18/19 AHI 33/ hr, desaturation to 68%, body weight 138 lbs ?Had CPAP titration at [facility] recommending CPAP auto 6-9, small nasal pillows.  ?Seen initially by Clent Ridges, NP on 10/28/19 on referral from Dr Dale [place] with c/o morning fatigue, snoring, restless sleep. Melatonin 3 mg hs.  ?She wanted to discuss options with physician. ?Body weight-138 lbs ?Epworth score- ?Covid vax- 3 Phizer ?Flu vax- had ?Neg family hx OSA. She has awakened with chest pressure/ anxious- suspects this was apnea but she has cardiology eval pending. Denies personal hx heart/ lung disease. Husband tells her of noisy breathing/ ?snoring. Not much aware of daytime sleepiness- admits occasionally would like a nap. Minimizes impact on alertness/ driving.  2 cups AM 1/2 caff. Melatonin 3 mg at hs. ?ENT surgery+ tonsils. ?Retired Runner, broadcasting/film/video.  ? ?08/06/21- 70 yoF never smoker followed for OSA, complicated by GERD, Hypercholesterolemia,  ?CPAP auto 5-15/ [facility]   ordered 06/09/20 ?Download-compliance 100%, AHI 3.8/ hr ?Body weight today- ?Covid vax-4 Phizer ?Flu vax-had ?------Patient thinks that the settings needs to be adjusted. The states that she feels gassy and burping during the day.  ?Download reviewed.  Comfort measures addressed.  CPAP does prevent her snoring.  We will try reducing pressure range to auto 4-12.  We discussed alternative therapies again. ? ?ROS-see HPI   + = positive ?Constitutional:    weight loss, night sweats, fevers, chills, fatigue, lassitude. ?HEENT:    headaches, difficulty swallowing, tooth/dental problems, sore throat,  ?     sneezing, itching, ear ache, nasal congestion, post nasal drip, snoring ?CV:    chest pain, orthopnea, PND, swelling in lower extremities, anasarca,                                  ? dizziness,  palpitations ?Resp:   shortness of breath with exertion or at rest.   ?             productive cough,   non-productive cough, coughing up of blood.   ?           change in color of mucus.  wheezing.   ?Skin:    rash or lesions. ?GI:  No-   heartburn, indigestion, abdominal pain, nausea, vomiting, diarrhea,  ?               change in bowel habits, loss of appetite ?GU: dysuria, change in color of urine, no urgency or frequency.   flank pain. ?MS:   joint pain, stiffness, decreased range of motion, back pain. ?Neuro-     nothing unusual ?Psych:  change in mood or affect.  depression or anxiety.   memory loss. ? ?OBJ- Physical Exam ?General- Alert, Oriented, Affect-appropriate, Distress- none acute, + not obese ?Skin- rash-none, lesions- none, excoriation- none ?Lymphadenopathy- none ?Head- atraumatic ?           Eyes- Gross vision intact, PERRLA, conjunctivae and secretions clear ?           Ears- Hearing, canals-normal ?           Nose- Clear, no-Septal dev, mucus, polyps, erosion, perforation  ?           Throat- Mallampati IV , mucosa clear , drainage- none, tonsils absent, +teeth ?  Neck- flexible , trachea midline, no stridor , thyroid nl, carotid no bruit ?Chest - symmetrical excursion , unlabored ?          Heart/CV- RRR , no murmur , no gallop  , no rub, nl s1 s2 ?                          - JVD- none , edema- none, stasis changes- none, varices- none ?          Lung- clear to P&A, wheeze- none, cough- none , dullness-none, rub- none ?          Chest wall-  ?Abd-  ?Br/ Gen/ Rectal- Not done, not indicated ?Extrem- cyanosis- none, clubbing, none, atrophy- none, strength- nl ?Neuro- grossly intact to observation ? ? ?

## 2021-08-06 ENCOUNTER — Encounter: Payer: Self-pay | Admitting: Internal Medicine

## 2021-08-06 ENCOUNTER — Ambulatory Visit (INDEPENDENT_AMBULATORY_CARE_PROVIDER_SITE_OTHER): Payer: Medicare PPO

## 2021-08-06 ENCOUNTER — Ambulatory Visit: Payer: Medicare PPO | Admitting: Internal Medicine

## 2021-08-06 ENCOUNTER — Ambulatory Visit: Payer: Medicare PPO

## 2021-08-06 VITALS — BP 110/70 | HR 72 | Temp 98.1°F | Ht 60.0 in | Wt 136.8 lb

## 2021-08-06 VITALS — BP 118/70 | HR 75 | Temp 97.9°F | Resp 15 | Ht 60.0 in | Wt 137.0 lb

## 2021-08-06 DIAGNOSIS — K219 Gastro-esophageal reflux disease without esophagitis: Secondary | ICD-10-CM

## 2021-08-06 DIAGNOSIS — G4733 Obstructive sleep apnea (adult) (pediatric): Secondary | ICD-10-CM | POA: Diagnosis not present

## 2021-08-06 DIAGNOSIS — Z0000 Encounter for general adult medical examination without abnormal findings: Secondary | ICD-10-CM | POA: Diagnosis not present

## 2021-08-06 DIAGNOSIS — M25551 Pain in right hip: Secondary | ICD-10-CM | POA: Diagnosis not present

## 2021-08-06 DIAGNOSIS — Z860100 Personal history of colon polyps, unspecified: Secondary | ICD-10-CM

## 2021-08-06 DIAGNOSIS — R739 Hyperglycemia, unspecified: Secondary | ICD-10-CM

## 2021-08-06 DIAGNOSIS — F439 Reaction to severe stress, unspecified: Secondary | ICD-10-CM

## 2021-08-06 DIAGNOSIS — E7800 Pure hypercholesterolemia, unspecified: Secondary | ICD-10-CM | POA: Diagnosis not present

## 2021-08-06 DIAGNOSIS — R944 Abnormal results of kidney function studies: Secondary | ICD-10-CM

## 2021-08-06 NOTE — Assessment & Plan Note (Signed)
Benefits from CPAP.  She thinks she sleeps better but not sure if she feels any better rested in the day.  Emphasis on comfort issues. ?Plan-try reducing pressure range to auto 4-12 for less aerophagia ?

## 2021-08-06 NOTE — Assessment & Plan Note (Addendum)
The 10-year ASCVD risk score (Arnett DK, et al., 2019) is: 7.5%   Values used to calculate the score:     Age: 71 years     Sex: Female     Is Non-Hispanic African American: No     Diabetic: No     Tobacco smoker: No     Systolic Blood Pressure: 118 mmHg     Is BP treated: No     HDL Cholesterol: 62.9 mg/dL     Total Cholesterol: 236 mg/dL  Discussed calculated cholesterol risk and recommendation to start statin medication.  Low cholesterol diet and exercise.  Follow lipid panel.  Calcium score - zero.

## 2021-08-06 NOTE — Patient Instructions (Signed)
Order- DME [facility]-- please reduce autopap range to 4-12, continue mask of choice, humidifier, supplies, Airview/ card ? ?You can talk with [facility], or look at an on-line source like CPAP.com for travel CPAP machines like Transcend or AirMini. ? ?Order- please refer to sleep center for mask fitting ?

## 2021-08-06 NOTE — Patient Instructions (Addendum)
?  Jessica Peck , ?Thank you for taking time to come for your Medicare Wellness Visit. I appreciate your ongoing commitment to your health goals. Please review the following plan we discussed and let me know if I can assist you in the future.  ? ?These are the goals we discussed: ? Goals   ? ?  ? Patient Stated  ?   DIET - INCREASE WATER INTAKE (pt-stated)   ?   Stay hydrated ?  ?   I would like to increase physical activity, as tolerated (pt-stated)   ?  ? Other  ?   I would like to increase water intake, eat low carb diet and lose 10-15lbs   ? ?  ?  ?This is a list of the screening recommended for you and due dates:  ?Health Maintenance  ?Topic Date Due  ? COVID-19 Vaccine (5 - Booster for Pfizer series) 08/22/2021*  ? Tetanus Vaccine  09/10/2021  ? Flu Shot  10/26/2021  ? Mammogram  12/05/2022  ? Colon Cancer Screening  12/12/2029  ? Pneumonia Vaccine  Completed  ? DEXA scan (bone density measurement)  Completed  ? Hepatitis C Screening: USPSTF Recommendation to screen - Ages 7-79 yo.  Completed  ? Zoster (Shingles) Vaccine  Completed  ? HPV Vaccine  Aged Out  ?*Topic was postponed. The date shown is not the original due date.  ?  ?

## 2021-08-06 NOTE — Progress Notes (Signed)
Subjective:   Jessica Peck is a 71 y.o. female who presents for Medicare Annual (Subsequent) preventive examination.  Review of Systems    No ROS.  Medicare Wellness   Cardiac Risk Factors include: advanced age (>99men, >54 women)     Objective:    Today's Vitals   08/06/21 1458  BP: 118/70  Pulse: 75  Resp: 15  Temp: 97.9 F (36.6 C)  SpO2: 97%  Weight: 137 lb (62.1 kg)  Height: 5' (1.524 m)   Body mass index is 26.76 kg/m.     08/06/2021    3:01 PM 07/01/2020   12:44 PM 07/01/2019   12:55 PM  Advanced Directives  Does Patient Have a Medical Advance Directive? Yes No Yes  Type of Estate agent of [place];Living will  Healthcare Power of [place];Living will  Does patient want to make changes to medical advance directive? No - Patient declined  No - Patient declined  Copy of Healthcare Power of Attorney in Chart? Yes - validated most recent copy scanned in chart (See row information)  No - copy requested  Would patient like information on creating a medical advance directive?  No - Patient declined     Current Medications (verified) Outpatient Encounter Medications as of 08/06/2021  Medication Sig   Cyanocobalamin 1000 MCG TBCR Take by mouth.   melatonin 5 MG TABS Take 5 mg by mouth at bedtime.   meloxicam (MOBIC) 7.5 MG tablet Take 7.5 mg by mouth daily as needed.    pantoprazole (PROTONIX) 20 MG tablet TAKE 1 TO 2 TABS BY MOUTH EVERY DAY   sertraline (ZOLOFT) 50 MG tablet TAKE 1 AND 1/2 TABLET PER DAY (Patient taking differently: Take 50 mg by mouth daily. Patient only taking 1 tablet not the half)   VITAMIN D, CHOLECALCIFEROL, PO Take 1,000 Units by mouth daily.   [DISCONTINUED] methylPREDNISolone (MEDROL DOSEPAK) 4 MG TBPK tablet Medrol dose pack 6 day taper. Take as directed. (Patient not taking: Reported on 08/06/2021)   No facility-administered encounter medications on file as of 08/06/2021.    Allergies (verified) Penicillins    History: Past Medical History:  Diagnosis Date   Chronic constipation    Esophagitis    Family history of breast cancer    Family history of colon cancer    Gastritis    EGD (hiatal hernia)   GERD (gastroesophageal reflux disease)    Hypercholesterolemia    Sleep apnea    CPAP   Vitamin D deficiency    Past Surgical History:  Procedure Laterality Date   APPENDECTOMY  1976   Family History  Problem Relation Age of Onset   Colon cancer Mother 37   COPD Mother    Heart disease Mother    Colon cancer Father        dx 67s   Breast cancer Sister 66       died age 71   Breast cancer Maternal Uncle        dx 67s   Breast cancer Paternal Aunt    Breast cancer Paternal Aunt    Social History   Socioeconomic History   Marital status: Married    Spouse name: Not on file   Number of children: 2   Years of education: Not on file   Highest education level: Not on file  Occupational History   Not on file  Tobacco Use   Smoking status: Former    Types: Cigarettes   Smokeless tobacco: Never  Tobacco comments:    Smoked socially in college  Vaping Use   Vaping Use: Never used  Substance and Sexual Activity   Alcohol use: Yes    Alcohol/week: 0.0 standard drinks    Comment: occasional   Drug use: No   Sexual activity: Not on file  Other Topics Concern   Not on file  Social History Narrative   Not on file   Social Determinants of Health   Financial Resource Strain: Low Risk    Difficulty of Paying Living Expenses: Not hard at all  Food Insecurity: No Food Insecurity   Worried About Programme researcher, broadcasting/film/video in the Last Year: Never true   Ran Out of Food in the Last Year: Never true  Transportation Needs: No Transportation Needs   Lack of Transportation (Medical): No   Lack of Transportation (Non-Medical): No  Physical Activity: Not on file  Stress: No Stress Concern Present   Feeling of Stress : Not at all  Social Connections: Unknown   Frequency of Communication  with Friends and Family: Not on file   Frequency of Social Gatherings with Friends and Family: Not on file   Attends Religious Services: Not on Scientist, clinical (histocompatibility and immunogenetics) or Organizations: Not on file   Attends Banker Meetings: Not on file   Marital Status: Married    Tobacco Counseling Counseling given: Not Answered Tobacco comments: Smoked socially in college   Clinical Intake:  Pre-visit preparation completed: Yes        Diabetes: No  How often do you need to have someone help you when you read instructions, pamphlets, or other written materials from your doctor or pharmacy?: 1 - Never   Interpreter Needed?: No      Activities of Daily Living    08/06/2021    3:02 PM  In your present state of health, do you have any difficulty performing the following activities:  Hearing? 0  Vision? 0  Difficulty concentrating or making decisions? 0  Walking or climbing stairs? 0  Dressing or bathing? 0  Doing errands, shopping? 0  Preparing Food and eating ? N  Using the Toilet? N  In the past six months, have you accidently leaked urine? N  Do you have problems with loss of bowel control? N  Managing your Medications? N  Managing your Finances? N  Housekeeping or managing your Housekeeping? N    Patient Care Team: Dale Loyalton, MD as PCP - General (Internal Medicine) Debbe Odea, MD as PCP - Cardiology (Cardiology)  Indicate any recent Medical Services you may have received from other than [facility] providers in the past year (date may be approximate).     Assessment:   This is a routine wellness examination for Jessica Peck.  Hearing/Vision screen Hearing Screening - Comments:: Patient is able to hear conversational tones without difficulty. No issues reported. Vision Screening - Comments:: Followed by Dr. Clydene Pugh  Wears corrective lenses  They have seen their ophthalmologist in the last 12 months.   Dietary issues and exercise activities  discussed: Current Exercise Habits: Home exercise routine, Type of exercise: walking, Intensity: Mild Regular diet   Goals Addressed               This Visit's Progress     Patient Stated     DIET - INCREASE WATER INTAKE (pt-stated)        Stay hydrated      I would like to increase physical activity, as tolerated (pt-stated)  Other     I would like to increase water intake, eat low carb diet and lose 10-15lbs   On track      Depression Screen    08/06/2021    3:00 PM 07/01/2020   12:42 PM 07/01/2019   12:56 PM 12/17/2018   11:46 AM 12/26/2016    4:01 PM 06/11/2015    8:02 AM 12/11/2014    3:36 PM  PHQ 2/9 Scores  PHQ - 2 Score 0 0 0 0 0 0 0  PHQ- 9 Score     0      Fall Risk    08/06/2021    3:02 PM 07/01/2020   12:45 PM 07/01/2019   12:56 PM 12/17/2018   11:46 AM 12/26/2016    4:01 PM  Fall Risk   Falls in the past year? 0 0 0 0 No  Number falls in past yr: 0 0     Injury with Fall?  0     Risk for fall due to : No Fall Risks      Follow up Falls evaluation completed Falls evaluation completed Falls evaluation completed;Falls prevention discussed Falls evaluation completed     FALL RISK PREVENTION PERTAINING TO THE HOME: Home free of loose throw rugs in walkways, pet beds, electrical cords, etc? Yes  Adequate lighting in your home to reduce risk of falls? Yes   ASSISTIVE DEVICES UTILIZED TO PREVENT FALLS: Life alert? No  Use of a cane, walker or w/c? No   TIMED UP AND GO: Was the test performed? No .   Cognitive Function:  Patient is alert and oriented x3.  Enjoys word puzzles and other brain games for cognitive health.       07/01/2019    1:04 PM  6CIT Screen  What Year? 0 points  What month? 0 points  What time? 0 points    Immunizations Immunization History  Administered Date(s) Administered   Hepatitis A 09/26/2011   Hepatitis B 09/26/2011   Influenza Split 01/02/2013, 01/14/2014, 01/20/2016   Influenza, High Dose Seasonal PF 01/16/2020,  12/31/2020   Influenza,inj,Quad PF,6+ Mos 12/26/2016   Influenza-Unspecified 01/27/2018   PFIZER Comirnaty(Gray Top)Covid-19 Tri-Sucrose Vaccine 07/28/2020   PFIZER(Purple Top)SARS-COV-2 Vaccination 04/01/2019, 04/22/2019, 02/01/2020   Pneumococcal Conjugate-13 04/10/2018   Pneumococcal Polysaccharide-23 08/27/2019   Td 09/11/2011   Zoster Recombinat (Shingrix) 10/18/2018, 01/01/2019   Zoster, Live 01/20/2016   Screening Tests Health Maintenance  Topic Date Due   COVID-19 Vaccine (5 - Booster for Pfizer series) 08/22/2021 (Originally 09/22/2020)   TETANUS/TDAP  09/10/2021   INFLUENZA VACCINE  10/26/2021   MAMMOGRAM  12/05/2022   COLONOSCOPY (Pts 45-66yrs Insurance coverage will need to be confirmed)  12/12/2029   Pneumonia Vaccine 17+ Years old  Completed   DEXA SCAN  Completed   Hepatitis C Screening  Completed   Zoster Vaccines- Shingrix  Completed   HPV VACCINES  Aged Out   Health Maintenance There are no preventive care reminders to display for this patient.  Mammogram- Genetic testing complete. Risk model Rockne Menghini was used to estimate her risk of developing breast cancer. This estimates her lifetime risk of developing breast cancer to be approximately 10%.  If qualifies, patients requests ultrasound mammogram next time due 12/04/21. Deferred to PCP for follow up.  Vision Screening: Recommended annual ophthalmology exams for early detection of glaucoma and other disorders of the eye.  Dental Screening: Recommended annual dental exams for proper oral hygiene  Community Resource Referral / Chronic Care Management:  CRR required this visit?  No   CCM required this visit?  No      Plan:   Keep all routine maintenance appointments.   I have personally reviewed and noted the following in the patient's chart:   Medical and social history Use of alcohol, tobacco or illicit drugs  Current medications and supplements including opioid prescriptions.  Functional ability and  status Nutritional status Physical activity Advanced directives List of other physicians Hospitalizations, surgeries, and ER visits in previous 12 months Vitals Screenings to include cognitive, depression, and falls Referrals and appointments  In addition, I have reviewed and discussed with patient certain preventive protocols, quality metrics, and best practice recommendations. A written personalized care plan for preventive services as well as general preventive health recommendations were provided to patient.     Ashok Pall, LPN   1/61/0960

## 2021-08-06 NOTE — Assessment & Plan Note (Signed)
I cannot tell if tendency towards GERD increases her tendency to aerophagia at night with CPAP. ?Plan-continue reflux precautions.  Reducing CPAP pressure range as noted. ?

## 2021-08-06 NOTE — Progress Notes (Signed)
Patient ID: Jessica Peck, female   DOB: 1950-07-22, 71 y.o.   MRN: [id number]   Subjective:    Patient ID: Jessica Peck, female    DOB: Apr 26, 1950, 71 y.o.   MRN: [id number]  This visit occurred during the SARS-CoV-2 public health emergency.  Safety protocols were in place, including screening questions prior to the visit, additional usage of staff PPE, and extensive cleaning of exam room while observing appropriate contact time as indicated for disinfecting solutions.   Patient here for a scheduled follow up.    HPI Sees pulmonary.  F/u sleep apnea.  Feels needs pressure adjusted.  Feels gassy and increased burping during the day.  Sees Dr Annamaria Boots.  Adjusting pressure range 4-12.  Recently evaluated for increased back pain.  Was prescribed prednisone, but was unable to travel to [place] - due to pain.  Saw ortho. Therapy. Had f/u 07/22/21 - persistent pain.  Felt majority of symptoms - bursitis.  S/p injection.  Has meloxicam. Injection helped.  Tries to stay active.  No chest pain.  Breathing overall stable.  Discussed recent labs.  Discussed kidney function - change.  Stay hydrated.  Will hold meloxicam.  Repeat today.  Triglycerides increased.  Discussed low carb diet and exercise.     Past Medical History:  Diagnosis Date   Chronic constipation    Esophagitis    Family history of breast cancer    Family history of colon cancer    Gastritis    EGD (hiatal hernia)   GERD (gastroesophageal reflux disease)    Hypercholesterolemia    Sleep apnea    CPAP   Vitamin D deficiency    Past Surgical History:  Procedure Laterality Date   APPENDECTOMY  1976   Family History  Problem Relation Age of Onset   Colon cancer Mother 56   COPD Mother    Heart disease Mother    Colon cancer Father        dx 78s   Breast cancer Sister 5       died age 53   Breast cancer Maternal Uncle        dx 29s   Breast cancer Paternal 70    Breast cancer Paternal Aunt    Social History    Socioeconomic History   Marital status: Married    Spouse name: Not on file   Number of children: 2   Years of education: Not on file   Highest education level: Not on file  Occupational History   Not on file  Tobacco Use   Smoking status: Former    Types: Cigarettes   Smokeless tobacco: Never   Tobacco comments:    Smoked socially in college  Vaping Use   Vaping Use: Never used  Substance and Sexual Activity   Alcohol use: Yes    Alcohol/week: 0.0 standard drinks    Comment: occasional   Drug use: No   Sexual activity: Not on file  Other Topics Concern   Not on file  Social History Narrative   Not on file   Social Determinants of Health   Financial Resource Strain: Low Risk    Difficulty of Paying Living Expenses: Not hard at all  Food Insecurity: No Food Insecurity   Worried About Charity fundraiser in the Last Year: Never true   [place] in the Last Year: Never true  Transportation Needs: No Transportation Needs   Lack of Transportation (Medical): No   Lack of Transportation (  Non-Medical): No  Physical Activity: Not on file  Stress: No Stress Concern Present   Feeling of Stress : Not at all  Social Connections: Unknown   Frequency of Communication with Friends and Family: Not on file   Frequency of Social Gatherings with Friends and Family: Not on file   Attends Religious Services: Not on file   Active Member of Clubs or Organizations: Not on file   Attends Archivist Meetings: Not on file   Marital Status: Married     Review of Systems  Constitutional:  Negative for appetite change and unexpected weight change.  HENT:  Negative for congestion and sinus pressure.   Respiratory:  Negative for cough, chest tightness and shortness of breath.   Cardiovascular:  Negative for chest pain, palpitations and leg swelling.  Gastrointestinal:  Negative for abdominal pain, diarrhea, nausea and vomiting.  Genitourinary:  Negative for difficulty  urinating and dysuria.  Musculoskeletal:  Negative for joint swelling and myalgias.  Skin:  Negative for color change and rash.  Neurological:  Negative for headaches.  Psychiatric/Behavioral:  Negative for agitation and dysphoric mood.       Objective:     BP 118/70 (BP Location: Left Arm, Patient Position: Sitting, Cuff Size: Normal)   Pulse 75   Temp 97.9 F (36.6 C) (Oral)   Ht 5' (1.524 m)   Wt 137 lb 12.8 oz (62.5 kg)   LMP 06/04/2000   SpO2 97%   BMI 26.91 kg/m  Wt Readings from Last 3 Encounters:  08/06/21 137 lb (62.1 kg)  08/06/21 137 lb 12.8 oz (62.5 kg)  08/06/21 136 lb 12.8 oz (62.1 kg)    Physical Exam Vitals reviewed.  Constitutional:      General: She is not in acute distress.    Appearance: Normal appearance.  HENT:     Head: Normocephalic and atraumatic.     Right Ear: External ear normal.     Left Ear: External ear normal.  Eyes:     General: No scleral icterus.       Right eye: No discharge.        Left eye: No discharge.     Conjunctiva/sclera: Conjunctivae normal.  Neck:     Thyroid: No thyromegaly.  Cardiovascular:     Rate and Rhythm: Normal rate and regular rhythm.  Pulmonary:     Effort: No respiratory distress.     Breath sounds: Normal breath sounds. No wheezing.  Abdominal:     General: Bowel sounds are normal.     Palpations: Abdomen is soft.     Tenderness: There is no abdominal tenderness.  Musculoskeletal:        General: No swelling or tenderness.     Cervical back: Neck supple. No tenderness.  Lymphadenopathy:     Cervical: No cervical adenopathy.  Skin:    Findings: No erythema or rash.  Neurological:     Mental Status: She is alert.  Psychiatric:        Mood and Affect: Mood normal.        Behavior: Behavior normal.     Outpatient Encounter Medications as of 08/06/2021  Medication Sig   Cyanocobalamin 1000 MCG TBCR Take by mouth.   melatonin 5 MG TABS Take 5 mg by mouth at bedtime.   meloxicam (MOBIC) 7.5 MG  tablet Take 7.5 mg by mouth daily as needed.    pantoprazole (PROTONIX) 20 MG tablet TAKE 1 TO 2 TABS BY MOUTH EVERY DAY   sertraline (  ZOLOFT) 50 MG tablet TAKE 1 AND 1/2 TABLET PER DAY (Patient taking differently: Take 50 mg by mouth daily. Patient only taking 1 tablet not the half)   VITAMIN D, CHOLECALCIFEROL, PO Take 1,000 Units by mouth daily.   [DISCONTINUED] methylPREDNISolone (MEDROL DOSEPAK) 4 MG TBPK tablet Medrol dose pack 6 day taper. Take as directed. (Patient not taking: Reported on 08/06/2021)   No facility-administered encounter medications on file as of 08/06/2021.     Lab Results  Component Value Date   WBC 4.5 12/03/2020   HGB 12.7 12/03/2020   HCT 38.7 12/03/2020   PLT 254.0 12/03/2020   GLUCOSE 125 (H) 08/06/2021   CHOL 236 (H) 07/27/2021   TRIG 273.0 (H) 07/27/2021   HDL 62.90 07/27/2021   LDLDIRECT 135.0 07/27/2021   LDLCALC 141 (H) 04/05/2021   ALT 18 07/27/2021   AST 15 07/27/2021   NA 139 08/06/2021   K 3.8 08/06/2021   CL 103 08/06/2021   CREATININE 0.80 08/06/2021   BUN 18 08/06/2021   CO2 22 08/06/2021   TSH 3.21 07/27/2021   HGBA1C 5.8 07/27/2021    MM 3D SCREEN BREAST BILATERAL  Result Date: 12/08/2020 CLINICAL DATA:  Screening. EXAM: DIGITAL SCREENING BILATERAL MAMMOGRAM WITH TOMOSYNTHESIS AND CAD TECHNIQUE: Bilateral screening digital craniocaudal and mediolateral oblique mammograms were obtained. Bilateral screening digital breast tomosynthesis was performed. The images were evaluated with computer-aided detection. COMPARISON:  Previous exam(s). ACR Breast Density Category b: There are scattered areas of fibroglandular density. FINDINGS: There are no findings suspicious for malignancy. IMPRESSION: No mammographic evidence of malignancy. A result letter of this screening mammogram will be mailed directly to the patient. RECOMMENDATION: Screening mammogram in one year. (Code:SM-B-01Y) BI-RADS CATEGORY  1: Negative. Electronically Signed   By: Valentino Saxon M.D.   On: 12/08/2020 13:19      Assessment & Plan:   Problem List Items Addressed This Visit     Decreased GFR    Hold meloxicam.  Stay hydrated.  Recheck met b today.        Relevant Orders   Basic metabolic panel (Completed)   Basic metabolic panel   GERD (gastroesophageal reflux disease)    Continues on protonix.  Adjusting cpap pressure to see if this helps with the gassy feeling and increased burping.  Follow.        History of colonic polyps    Colonoscopy 12/13/19 - diverticulosis and internal hemorrhoids.        Hyperglycemia    Low carb diet and exercise.  Follow met b and a1c.        Relevant Orders   Hemoglobin A1c   Obstructive sleep apnea    Sees pulmonary.  F/u sleep apnea.  Feels needs pressure adjusted.  Feels gassy and increased burping during the day.  Sees Dr Annamaria Boots.  Adjusting pressure range 4-12.        Pure hypercholesterolemia    The 10-year ASCVD risk score (Arnett DK, et al., 2019) is: 7.5%   Values used to calculate the score:     Age: 3 years     Sex: Female     Is Non-Hispanic African American: No     Diabetic: No     Tobacco smoker: No     Systolic Blood Pressure: 540 mmHg     Is BP treated: No     HDL Cholesterol: 62.9 mg/dL     Total Cholesterol: 236 mg/dL  Discussed calculated cholesterol risk and recommendation to start statin medication.  Low cholesterol diet and exercise.  Follow lipid panel.  Calcium score - zero.         Relevant Orders   CBC with Differential/Platelet   Hepatic function panel   Lipid panel   Right hip pain    Recently saw ortho.  Felt to be c/w bursitis.  S/p injection.  Helped.  Hold meloxicam.  Follow.        Stress    Increased stress as outlined.  On zoloft.  Continue. Follow.  Appears to be handling things relatively well.          Einar Pheasant, MD

## 2021-08-07 LAB — BASIC METABOLIC PANEL
BUN/Creatinine Ratio: 23 (ref 12–28)
BUN: 18 mg/dL (ref 8–27)
CO2: 22 mmol/L (ref 20–29)
Calcium: 10 mg/dL (ref 8.7–10.3)
Chloride: 103 mmol/L (ref 96–106)
Creatinine, Ser: 0.8 mg/dL (ref 0.57–1.00)
Glucose: 125 mg/dL — ABNORMAL HIGH (ref 70–99)
Potassium: 3.8 mmol/L (ref 3.5–5.2)
Sodium: 139 mmol/L (ref 134–144)
eGFR: 80 mL/min/{1.73_m2} (ref >59)

## 2021-08-14 ENCOUNTER — Other Ambulatory Visit: Payer: Self-pay | Admitting: Internal Medicine

## 2021-08-15 ENCOUNTER — Encounter: Payer: Self-pay | Admitting: Internal Medicine

## 2021-08-15 NOTE — Assessment & Plan Note (Signed)
Colonoscopy 12/13/19 - diverticulosis and internal hemorrhoids.  

## 2021-08-15 NOTE — Assessment & Plan Note (Signed)
Continues on protonix.  Adjusting cpap pressure to see if this helps with the gassy feeling and increased burping.  Follow.

## 2021-08-15 NOTE — Assessment & Plan Note (Signed)
Hold meloxicam.  Stay hydrated.  Recheck met b today.

## 2021-08-15 NOTE — Assessment & Plan Note (Signed)
Low carb diet and exercise.  Follow met b and a1c.  

## 2021-08-15 NOTE — Assessment & Plan Note (Signed)
Increased stress as outlined.  On zoloft.  Continue. Follow.  Appears to be handling things relatively well.  

## 2021-08-15 NOTE — Assessment & Plan Note (Signed)
Recently saw ortho.  Felt to be c/w bursitis.  S/p injection.  Helped.  Hold meloxicam.  Follow.

## 2021-08-15 NOTE — Assessment & Plan Note (Signed)
Sees pulmonary.  F/u sleep apnea.  Feels needs pressure adjusted.  Feels gassy and increased burping during the day.  Sees Dr Annamaria Boots.  Adjusting pressure range 4-12.

## 2021-10-05 ENCOUNTER — Other Ambulatory Visit (HOSPITAL_BASED_OUTPATIENT_CLINIC_OR_DEPARTMENT_OTHER): Payer: Medicare PPO | Admitting: Internal Medicine

## 2021-11-04 ENCOUNTER — Other Ambulatory Visit: Payer: Medicare PPO

## 2021-11-09 ENCOUNTER — Ambulatory Visit: Payer: Medicare PPO | Admitting: Internal Medicine

## 2021-12-03 ENCOUNTER — Other Ambulatory Visit: Payer: Medicare PPO

## 2021-12-07 ENCOUNTER — Other Ambulatory Visit (HOSPITAL_BASED_OUTPATIENT_CLINIC_OR_DEPARTMENT_OTHER): Payer: Medicare PPO | Admitting: Internal Medicine

## 2021-12-08 ENCOUNTER — Ambulatory Visit: Payer: Medicare PPO | Admitting: Internal Medicine

## 2021-12-30 ENCOUNTER — Other Ambulatory Visit: Payer: Medicare PPO

## 2022-01-03 ENCOUNTER — Ambulatory Visit: Payer: Medicare PPO | Admitting: Internal Medicine

## 2022-01-10 ENCOUNTER — Other Ambulatory Visit: Payer: Medicare PPO

## 2022-01-11 ENCOUNTER — Ambulatory Visit (HOSPITAL_BASED_OUTPATIENT_CLINIC_OR_DEPARTMENT_OTHER): Payer: Medicare PPO

## 2022-01-11 ENCOUNTER — Other Ambulatory Visit (INDEPENDENT_AMBULATORY_CARE_PROVIDER_SITE_OTHER): Payer: Medicare PPO

## 2022-01-11 DIAGNOSIS — R944 Abnormal results of kidney function studies: Secondary | ICD-10-CM

## 2022-01-11 DIAGNOSIS — E7800 Pure hypercholesterolemia, unspecified: Secondary | ICD-10-CM

## 2022-01-11 DIAGNOSIS — R739 Hyperglycemia, unspecified: Secondary | ICD-10-CM | POA: Diagnosis not present

## 2022-01-11 LAB — CBC WITH DIFFERENTIAL/PLATELET
Basophils Absolute: 0 10*3/uL (ref 0.0–0.1)
Basophils Relative: 0.7 % (ref 0.0–3.0)
Eosinophils Absolute: 0.2 10*3/uL (ref 0.0–0.7)
Eosinophils Relative: 3.4 % (ref 0.0–5.0)
HCT: 37.8 % (ref 36.0–46.0)
Hemoglobin: 12.9 g/dL (ref 12.0–15.0)
Lymphocytes Relative: 52.2 % — ABNORMAL HIGH (ref 12.0–46.0)
Lymphs Abs: 2.5 10*3/uL (ref 0.7–4.0)
MCHC: 34 g/dL (ref 30.0–36.0)
MCV: 90.7 fl (ref 78.0–100.0)
Monocytes Absolute: 0.3 10*3/uL (ref 0.1–1.0)
Monocytes Relative: 6 % (ref 3.0–12.0)
Neutro Abs: 1.8 10*3/uL (ref 1.4–7.7)
Neutrophils Relative %: 37.7 % — ABNORMAL LOW (ref 43.0–77.0)
Platelets: 288 10*3/uL (ref 150.0–400.0)
RBC: 4.17 Mil/uL (ref 3.87–5.11)
RDW: 13.5 % (ref 11.5–15.5)
WBC: 4.8 10*3/uL (ref 4.0–10.5)

## 2022-01-11 LAB — LIPID PANEL
Cholesterol: 234 mg/dL — ABNORMAL HIGH (ref 0–200)
HDL: 59.5 mg/dL (ref >39.00)
LDL Cholesterol: 141 mg/dL — ABNORMAL HIGH (ref 0–99)
NonHDL: 174.35
Total CHOL/HDL Ratio: 4
Triglycerides: 168 mg/dL — ABNORMAL HIGH (ref 0.0–149.0)
VLDL: 33.6 mg/dL (ref 0.0–40.0)

## 2022-01-11 LAB — BASIC METABOLIC PANEL
BUN: 22 mg/dL (ref 6–23)
CO2: 25 mEq/L (ref 19–32)
Calcium: 9.8 mg/dL (ref 8.4–10.5)
Chloride: 105 mEq/L (ref 96–112)
Creatinine, Ser: 0.8 mg/dL (ref 0.40–1.20)
GFR: 74.9 mL/min (ref >60.00)
Glucose, Bld: 102 mg/dL — ABNORMAL HIGH (ref 70–99)
Potassium: 4.1 mEq/L (ref 3.5–5.1)
Sodium: 138 mEq/L (ref 135–145)

## 2022-01-11 LAB — HEPATIC FUNCTION PANEL
ALT: 15 U/L (ref 0–35)
AST: 17 U/L (ref 0–37)
Albumin: 4.5 g/dL (ref 3.5–5.2)
Alkaline Phosphatase: 48 U/L (ref 39–117)
Bilirubin, Direct: 0.1 mg/dL (ref 0.0–0.3)
Total Bilirubin: 0.4 mg/dL (ref 0.2–1.2)
Total Protein: 6.9 g/dL (ref 6.0–8.3)

## 2022-01-11 LAB — HEMOGLOBIN A1C: Hgb A1c MFr Bld: 5.9 % (ref 4.6–6.5)

## 2022-01-12 ENCOUNTER — Other Ambulatory Visit: Payer: Self-pay | Admitting: Internal Medicine

## 2022-01-12 DIAGNOSIS — Z1231 Encounter for screening mammogram for malignant neoplasm of breast: Secondary | ICD-10-CM

## 2022-01-13 ENCOUNTER — Ambulatory Visit: Payer: Medicare PPO | Admitting: Internal Medicine

## 2022-01-14 ENCOUNTER — Encounter: Payer: Self-pay | Admitting: Internal Medicine

## 2022-01-14 ENCOUNTER — Ambulatory Visit: Payer: Medicare PPO | Admitting: Internal Medicine

## 2022-01-14 VITALS — BP 112/78 | HR 66 | Temp 98.3°F | Ht 60.0 in | Wt 135.6 lb

## 2022-01-14 DIAGNOSIS — R1310 Dysphagia, unspecified: Secondary | ICD-10-CM | POA: Diagnosis not present

## 2022-01-14 DIAGNOSIS — Z23 Encounter for immunization: Secondary | ICD-10-CM | POA: Diagnosis not present

## 2022-01-14 DIAGNOSIS — K219 Gastro-esophageal reflux disease without esophagitis: Secondary | ICD-10-CM

## 2022-01-14 DIAGNOSIS — F439 Reaction to severe stress, unspecified: Secondary | ICD-10-CM

## 2022-01-14 DIAGNOSIS — Z86010 Personal history of colonic polyps: Secondary | ICD-10-CM | POA: Diagnosis not present

## 2022-01-14 DIAGNOSIS — R739 Hyperglycemia, unspecified: Secondary | ICD-10-CM

## 2022-01-14 DIAGNOSIS — E7800 Pure hypercholesterolemia, unspecified: Secondary | ICD-10-CM

## 2022-01-14 DIAGNOSIS — G4733 Obstructive sleep apnea (adult) (pediatric): Secondary | ICD-10-CM

## 2022-01-14 NOTE — Assessment & Plan Note (Addendum)
The 10-year ASCVD risk score (Arnett DK, et al., 2019) is: 6.9%   Values used to calculate the score:     Age: 71 years     Sex: Female     Is Non-Hispanic African American: No     Diabetic: No     Tobacco smoker: No     Systolic Blood Pressure: 102 mmHg     Is BP treated: No     HDL Cholesterol: 59.5 mg/dL     Total Cholesterol: 234 mg/dL  Low cholesterol diet and exercise.  Follow lipid panel.

## 2022-01-14 NOTE — Progress Notes (Addendum)
Patient ID: Jessica Peck, female   DOB: Nov 19, 1950, 71 y.o.   MRN: [id number]   Subjective:    Patient ID: Jessica Peck, female    DOB: [birth year], 71 y.o.   MRN: [id number]   Patient here for  Chief Complaint  Patient presents with   Follow-up   .   HPI Here to follow up regarding hypercholesterolemia and sleep apnea.  Calcium score 0.  Increased stress.  Discussed.  On zoloft.  Discussed increasing dose to 5m q day.  Tries to stay active.  No chest pain or sob reported.  No abdominal pain or bowel issue reported.  Wearing cpap - scheduled Monday for mask fitting/evaluation.  Also reports issues with swallowing.  Discussed possible aspiration.  Discussed further evaluation with swallowing study.     Past Medical History:  Diagnosis Date   Chronic constipation    Esophagitis    Family history of breast cancer    Family history of colon cancer    Gastritis    EGD (hiatal hernia)   GERD (gastroesophageal reflux disease)    Hypercholesterolemia    Sleep apnea    CPAP   Vitamin D deficiency    Past Surgical History:  Procedure Laterality Date   APPENDECTOMY  1976   Family History  Problem Relation Age of Onset   Colon cancer Mother 726  COPD Mother    Heart disease Mother    Colon cancer Father        dx 768s  Breast cancer Sister 354      died age 70  Breast cancer Maternal Uncle        dx 650s  Breast cancer Paternal A14   Breast cancer Paternal Aunt    Social History   Socioeconomic History   Marital status: Married    Spouse name: Not on file   Number of children: 2   Years of education: Not on file   Highest education level: Not on file  Occupational History   Not on file  Tobacco Use   Smoking status: Former    Types: Cigarettes   Smokeless tobacco: Never   Tobacco comments:    Smoked socially in college  Vaping Use   Vaping Use: Never used  Substance and Sexual Activity   Alcohol use: Yes    Alcohol/week: 0.0 standard drinks of  alcohol    Comment: occasional   Drug use: No   Sexual activity: Not on file  Other Topics Concern   Not on file  Social History Narrative   Not on file   Social Determinants of Health   Financial Resource Strain: Low Risk  (08/06/2021)   Overall Financial Resource Strain (CARDIA)    Difficulty of Paying Living Expenses: Not hard at all  Food Insecurity: No Food Insecurity (08/06/2021)   Hunger Vital Sign    Worried About Running Out of Food in the Last Year: Never true    Ran Out of Food in the Last Year: Never true  Transportation Needs: No Transportation Needs (08/06/2021)   PRAPARE - THydrologist(Medical): No    Lack of Transportation (Non-Medical): No  Physical Activity: Unknown (07/01/2020)   Exercise Vital Sign    Days of Exercise per Week: 0 days    Minutes of Exercise per Session: Not on file  Stress: No Stress Concern Present (08/06/2021)   [place]  Feeling of Stress : Not at all  Social Connections: Unknown (08/06/2021)   Social Connection and Isolation Panel [NHANES]    Frequency of Communication with Friends and Family: Not on file    Frequency of Social Gatherings with Friends and Family: Not on file    Attends Religious Services: Not on file    Active Member of Clubs or Organizations: Not on file    Attends Archivist Meetings: Not on file    Marital Status: Married     Review of Systems  Constitutional:  Negative for appetite change and unexpected weight change.  HENT:  Negative for congestion and sinus pressure.   Respiratory:  Negative for cough, chest tightness and shortness of breath.   Cardiovascular:  Negative for chest pain, palpitations and leg swelling.  Gastrointestinal:  Negative for abdominal pain, diarrhea, nausea and vomiting.  Genitourinary:  Negative for difficulty urinating and dysuria.  Musculoskeletal:  Negative for joint swelling and  myalgias.  Skin:  Negative for color change and rash.  Neurological:  Negative for dizziness and headaches.  Psychiatric/Behavioral:  Negative for dysphoric mood.        Increased stress as outlined.        Objective:     BP 112/78 (BP Location: Left Arm, Patient Position: Sitting, Cuff Size: Normal)   Pulse 66   Temp 98.3 F (36.8 C) (Oral)   Ht 5' (1.524 m)   Wt 135 lb 9.6 oz (61.5 kg)   LMP 06/04/2000   SpO2 97%   BMI 26.48 kg/m  Wt Readings from Last 3 Encounters:  01/17/22 136 lb (61.7 kg)  01/14/22 135 lb 9.6 oz (61.5 kg)  08/06/21 137 lb (62.1 kg)    Physical Exam Vitals reviewed.  Constitutional:      General: She is not in acute distress.    Appearance: Normal appearance.  HENT:     Head: Normocephalic and atraumatic.     Right Ear: External ear normal.     Left Ear: External ear normal.  Eyes:     General: No scleral icterus.       Right eye: No discharge.        Left eye: No discharge.     Conjunctiva/sclera: Conjunctivae normal.  Neck:     Thyroid: No thyromegaly.  Cardiovascular:     Rate and Rhythm: Normal rate and regular rhythm.  Pulmonary:     Effort: No respiratory distress.     Breath sounds: Normal breath sounds. No wheezing.  Abdominal:     General: Bowel sounds are normal.     Palpations: Abdomen is soft.     Tenderness: There is no abdominal tenderness.  Musculoskeletal:        General: No swelling or tenderness.     Cervical back: Neck supple. No tenderness.  Lymphadenopathy:     Cervical: No cervical adenopathy.  Skin:    Findings: No erythema or rash.  Neurological:     Mental Status: She is alert.  Psychiatric:        Mood and Affect: Mood normal.        Behavior: Behavior normal.      Outpatient Encounter Medications as of 01/14/2022  Medication Sig   Cyanocobalamin 1000 MCG TBCR Take by mouth.   melatonin 5 MG TABS Take 5 mg by mouth at bedtime.   meloxicam (MOBIC) 7.5 MG tablet Take 7.5 mg by mouth daily as needed.     pantoprazole (PROTONIX) 20 MG tablet TAKE 1  TO 2 TABS BY MOUTH EVERY DAY   sertraline (ZOLOFT) 50 MG tablet TAKE 1 AND 1/2 TABLET PER DAY   VITAMIN D, CHOLECALCIFEROL, PO Take 1,000 Units by mouth daily.   No facility-administered encounter medications on file as of 01/14/2022.     Lab Results  Component Value Date   WBC 4.8 01/11/2022   HGB 12.9 01/11/2022   HCT 37.8 01/11/2022   PLT 288.0 01/11/2022   GLUCOSE 102 (H) 01/11/2022   CHOL 234 (H) 01/11/2022   TRIG 168.0 (H) 01/11/2022   HDL 59.50 01/11/2022   LDLDIRECT 135.0 07/27/2021   LDLCALC 141 (H) 01/11/2022   ALT 15 01/11/2022   AST 17 01/11/2022   NA 138 01/11/2022   K 4.1 01/11/2022   CL 105 01/11/2022   CREATININE 0.80 01/11/2022   BUN 22 01/11/2022   CO2 25 01/11/2022   TSH 3.21 07/27/2021   HGBA1C 5.9 01/11/2022    MM 3D SCREEN BREAST BILATERAL  Result Date: 12/08/2020 CLINICAL DATA:  Screening. EXAM: DIGITAL SCREENING BILATERAL MAMMOGRAM WITH TOMOSYNTHESIS AND CAD TECHNIQUE: Bilateral screening digital craniocaudal and mediolateral oblique mammograms were obtained. Bilateral screening digital breast tomosynthesis was performed. The images were evaluated with computer-aided detection. COMPARISON:  Previous exam(s). ACR Breast Density Category b: There are scattered areas of fibroglandular density. FINDINGS: There are no findings suspicious for malignancy. IMPRESSION: No mammographic evidence of malignancy. A result letter of this screening mammogram will be mailed directly to the patient. RECOMMENDATION: Screening mammogram in one year. (Code:SM-B-01Y) BI-RADS CATEGORY  1: Negative. Electronically Signed   By: Valentino Saxon M.D.   On: 12/08/2020 13:19      Assessment & Plan:   Problem List Items Addressed This Visit     GERD (gastroesophageal reflux disease)    Continues on protonix.        History of colonic polyps    Colonoscopy 12/13/19 - diverticulosis and internal hemorrhoids.       Hyperglycemia     Low carb diet and exercise.  Follow met b and a1c.       Obstructive sleep apnea    Has seen pulmonary.  Wearing cpap.  Planning for evaluation - mask fitting next week.  Follow.       Pure hypercholesterolemia    The 10-year ASCVD risk score (Arnett DK, et al., 2019) is: 6.9%   Values used to calculate the score:     Age: 27 years     Sex: Female     Is Non-Hispanic African American: No     Diabetic: No     Tobacco smoker: No     Systolic Blood Pressure: 875 mmHg     Is BP treated: No     HDL Cholesterol: 59.5 mg/dL     Total Cholesterol: 234 mg/dL  Low cholesterol diet and exercise.  Follow lipid panel.       Stress    Increased stress.  Discussed.  On zoloft.  Discussed increasing dose.  Increase to 60m q day.  Follow.       Swallowing difficulty    Question of aspiration.  Discussed.  On PPI.  Obtain UGI with barium swallow.        Other Visit Diagnoses     Need for immunization against influenza    -  Primary   Relevant Orders   Flu Vaccine QUAD High Dose(Fluad) (Completed)        CEinar Pheasant MD

## 2022-01-17 ENCOUNTER — Ambulatory Visit (HOSPITAL_BASED_OUTPATIENT_CLINIC_OR_DEPARTMENT_OTHER): Payer: Medicare PPO | Attending: Internal Medicine | Admitting: Radiology

## 2022-01-17 DIAGNOSIS — G4733 Obstructive sleep apnea (adult) (pediatric): Secondary | ICD-10-CM

## 2022-01-23 ENCOUNTER — Encounter: Payer: Self-pay | Admitting: Internal Medicine

## 2022-01-23 DIAGNOSIS — R1310 Dysphagia, unspecified: Secondary | ICD-10-CM | POA: Insufficient documentation

## 2022-01-23 NOTE — Assessment & Plan Note (Signed)
Question of aspiration.  Discussed.  On PPI.  Obtain UGI with barium swallow.

## 2022-01-23 NOTE — Assessment & Plan Note (Signed)
Has seen pulmonary.  Wearing cpap.  Planning for evaluation - mask fitting next week.  Follow.

## 2022-01-23 NOTE — Assessment & Plan Note (Signed)
Continues on protonix.  ?

## 2022-01-23 NOTE — Assessment & Plan Note (Signed)
Colonoscopy 12/13/19 - diverticulosis and internal hemorrhoids.  

## 2022-01-23 NOTE — Assessment & Plan Note (Signed)
Increased stress.  Discussed.  On zoloft.  Discussed increasing dose.  Increase to 75mg  q day.  Follow.

## 2022-01-23 NOTE — Assessment & Plan Note (Signed)
Low carb diet and exercise.  Follow met b and a1c.  

## 2022-01-26 ENCOUNTER — Other Ambulatory Visit: Payer: Self-pay | Admitting: Internal Medicine

## 2022-01-26 DIAGNOSIS — R1310 Dysphagia, unspecified: Secondary | ICD-10-CM

## 2022-01-26 NOTE — Progress Notes (Signed)
Order placed for UGI with barium swallow.   

## 2022-02-03 ENCOUNTER — Other Ambulatory Visit: Payer: Medicare PPO

## 2022-02-22 ENCOUNTER — Other Ambulatory Visit: Payer: Self-pay | Admitting: Internal Medicine

## 2022-02-25 ENCOUNTER — Ambulatory Visit
Admission: RE | Admit: 2022-02-25 | Discharge: 2022-02-25 | Disposition: A | Discharge status: Home / Self Care | Payer: Medicare PPO | Source: Ambulatory Visit | Attending: Internal Medicine | Admitting: Internal Medicine

## 2022-02-25 DIAGNOSIS — Z1231 Encounter for screening mammogram for malignant neoplasm of breast: Secondary | ICD-10-CM | POA: Diagnosis not present

## 2022-03-28 HISTORY — PX: TOTAL HIP ARTHROPLASTY: SHX124

## 2022-03-31 ENCOUNTER — Other Ambulatory Visit: Payer: Self-pay | Admitting: Internal Medicine

## 2022-04-15 ENCOUNTER — Telehealth: Payer: Self-pay | Admitting: Internal Medicine

## 2022-04-15 DIAGNOSIS — E7800 Pure hypercholesterolemia, unspecified: Secondary | ICD-10-CM

## 2022-04-15 DIAGNOSIS — R739 Hyperglycemia, unspecified: Secondary | ICD-10-CM

## 2022-04-15 NOTE — Telephone Encounter (Signed)
Patient called and wanted to know if Dr Nicki Reaper wanted her to do labs before her Physical on 04/21/22. No orders in chart

## 2022-04-15 NOTE — Telephone Encounter (Signed)
Labs ordered. Lab appt scheduled 04/19/22.

## 2022-04-15 NOTE — Addendum Note (Signed)
Addended by: Roetta Sessions D on: 04/15/2022 01:35 PM   Modules accepted: Orders

## 2022-04-19 ENCOUNTER — Other Ambulatory Visit (INDEPENDENT_AMBULATORY_CARE_PROVIDER_SITE_OTHER): Payer: Medicare PPO

## 2022-04-19 DIAGNOSIS — R739 Hyperglycemia, unspecified: Secondary | ICD-10-CM

## 2022-04-19 DIAGNOSIS — E7800 Pure hypercholesterolemia, unspecified: Secondary | ICD-10-CM | POA: Diagnosis not present

## 2022-04-19 LAB — HEPATIC FUNCTION PANEL
ALT: 17 U/L (ref 0–35)
AST: 17 U/L (ref 0–37)
Albumin: 4.5 g/dL (ref 3.5–5.2)
Alkaline Phosphatase: 49 U/L (ref 39–117)
Bilirubin, Direct: 0.1 mg/dL (ref 0.0–0.3)
Total Bilirubin: 0.4 mg/dL (ref 0.2–1.2)
Total Protein: 7.4 g/dL (ref 6.0–8.3)

## 2022-04-19 LAB — LIPID PANEL
Cholesterol: 241 mg/dL — ABNORMAL HIGH (ref 0–200)
HDL: 61.9 mg/dL (ref >39.00)
LDL Cholesterol: 141 mg/dL — ABNORMAL HIGH (ref 0–99)
NonHDL: 178.76
Total CHOL/HDL Ratio: 4
Triglycerides: 189 mg/dL — ABNORMAL HIGH (ref 0.0–149.0)
VLDL: 37.8 mg/dL (ref 0.0–40.0)

## 2022-04-19 LAB — BASIC METABOLIC PANEL
BUN: 14 mg/dL (ref 6–23)
CO2: 27 mEq/L (ref 19–32)
Calcium: 9.9 mg/dL (ref 8.4–10.5)
Chloride: 105 mEq/L (ref 96–112)
Creatinine, Ser: 0.84 mg/dL (ref 0.40–1.20)
GFR: 70.51 mL/min (ref >60.00)
Glucose, Bld: 101 mg/dL — ABNORMAL HIGH (ref 70–99)
Potassium: 4.2 mEq/L (ref 3.5–5.1)
Sodium: 140 mEq/L (ref 135–145)

## 2022-04-19 LAB — HEMOGLOBIN A1C: Hgb A1c MFr Bld: 6 % (ref 4.6–6.5)

## 2022-04-21 ENCOUNTER — Encounter: Payer: Self-pay | Admitting: Internal Medicine

## 2022-04-21 ENCOUNTER — Ambulatory Visit (INDEPENDENT_AMBULATORY_CARE_PROVIDER_SITE_OTHER): Payer: Medicare PPO | Admitting: Internal Medicine

## 2022-04-21 VITALS — BP 118/70 | HR 83 | Temp 97.6°F | Resp 16 | Ht 61.0 in | Wt 136.0 lb

## 2022-04-21 DIAGNOSIS — R1310 Dysphagia, unspecified: Secondary | ICD-10-CM

## 2022-04-21 DIAGNOSIS — E7800 Pure hypercholesterolemia, unspecified: Secondary | ICD-10-CM

## 2022-04-21 DIAGNOSIS — Z86010 Personal history of colonic polyps: Secondary | ICD-10-CM

## 2022-04-21 DIAGNOSIS — R739 Hyperglycemia, unspecified: Secondary | ICD-10-CM

## 2022-04-21 DIAGNOSIS — Z0000 Encounter for general adult medical examination without abnormal findings: Secondary | ICD-10-CM

## 2022-04-21 DIAGNOSIS — K219 Gastro-esophageal reflux disease without esophagitis: Secondary | ICD-10-CM

## 2022-04-21 DIAGNOSIS — F439 Reaction to severe stress, unspecified: Secondary | ICD-10-CM | POA: Diagnosis not present

## 2022-04-21 NOTE — Assessment & Plan Note (Addendum)
The 10-year ASCVD risk score (Arnett DK, et al., 2019) is: 8.4%   Values used to calculate the score:     Age: 72 years     Sex: Female     Is Non-Hispanic African American: No     Diabetic: No     Tobacco smoker: No     Systolic Blood Pressure: 382 mmHg     Is BP treated: No     HDL Cholesterol: 61.9 mg/dL     Total Cholesterol: 241 mg/dL  Low cholesterol diet and exercise.  Discussed calculated cholesterol risk.  Discussed recommendation to start a cholesterol medication.  Wants to work on diet and exercise. Follow lipid panel.

## 2022-04-21 NOTE — Assessment & Plan Note (Signed)
Physical 04/21/22.  PAP 12/17/18 - negative with negative HPV.  Mammogram 02/25/22 - Birads I. Colonoscopy 12/13/19 - internal hemorrhoids and diverticulosis.

## 2022-04-21 NOTE — Progress Notes (Signed)
Subjective:    Patient ID: Jessica Peck, female    DOB: 08/23/1950, 72 y.o.   MRN: [id number]  Patient here for  Chief Complaint  Patient presents with   Annual Exam    HPI Here for her physical exam. Reports she is doing relatively well. Last visit, increased zoloft to 75mg  q day. This dose seems to be doing ok for her.  No chest pain or sob reported.  No increased cough or congestion.  No abdominal pain or bowel change reported. Wearing cpap.  Swallowing issues reported last visit. Swallowing test not scheduled.  No vomiting.   Past Medical History:  Diagnosis Date   Chronic constipation    Esophagitis    Family history of breast cancer    Family history of colon cancer    Gastritis    EGD (hiatal hernia)   GERD (gastroesophageal reflux disease)    Hypercholesterolemia    Sleep apnea    CPAP   Vitamin D deficiency    Past Surgical History:  Procedure Laterality Date   APPENDECTOMY  1976   Family History  Problem Relation Age of Onset   Colon cancer Mother 24   COPD Mother    Heart disease Mother    Colon cancer Father        dx 39s   Breast cancer Sister 54       died age 16   Breast cancer Maternal Uncle        dx 19s   Breast cancer Paternal 33    Breast cancer Paternal Aunt    Social History   Socioeconomic History   Marital status: Married    Spouse name: Not on file   Number of children: 2   Years of education: Not on file   Highest education level: Not on file  Occupational History   Not on file  Tobacco Use   Smoking status: Former    Types: Cigarettes   Smokeless tobacco: Never   Tobacco comments:    Smoked socially in college  Vaping Use   Vaping Use: Never used  Substance and Sexual Activity   Alcohol use: Yes    Alcohol/week: 0.0 standard drinks of alcohol    Comment: occasional   Drug use: No   Sexual activity: Not on file  Other Topics Concern   Not on file  Social History Narrative   Not on file   Social Determinants  of Health   Financial Resource Strain: Low Risk  (08/06/2021)   Overall Financial Resource Strain (CARDIA)    Difficulty of Paying Living Expenses: Not hard at all  Food Insecurity: No Food Insecurity (08/06/2021)   Hunger Vital Sign    Worried About Running Out of Food in the Last Year: Never true    Ran Out of Food in the Last Year: Never true  Transportation Needs: No Transportation Needs (08/06/2021)   PRAPARE - Hydrologist (Medical): No    Lack of Transportation (Non-Medical): No  Physical Activity: Unknown (07/01/2020)   Exercise Vital Sign    Days of Exercise per Week: 0 days    Minutes of Exercise per Session: Not on file  Stress: No Stress Concern Present (08/06/2021)   [place]    Feeling of Stress : Not at all  Social Connections: Unknown (08/06/2021)   Social Connection and Isolation Panel [NHANES]    Frequency of Communication with Friends and Family: Not  on file    Frequency of Social Gatherings with Friends and Family: Not on file    Attends Religious Services: Not on file    Active Member of Clubs or Organizations: Not on file    Attends Banker Meetings: Not on file    Marital Status: Married     Review of Systems  Constitutional:  Negative for appetite change and unexpected weight change.  HENT:  Negative for congestion, sinus pressure and sore throat.   Eyes:  Negative for pain and visual disturbance.  Respiratory:  Negative for cough, chest tightness and shortness of breath.   Cardiovascular:  Negative for chest pain, palpitations and leg swelling.  Gastrointestinal:  Negative for abdominal pain, diarrhea, nausea and vomiting.       Swallowing issues as outlined.   Genitourinary:  Negative for difficulty urinating and dysuria.  Musculoskeletal:  Negative for joint swelling and myalgias.  Skin:  Negative for color change and rash.  Neurological:  Negative  for dizziness and headaches.  Hematological:  Negative for adenopathy. Does not bruise/bleed easily.  Psychiatric/Behavioral:  Negative for agitation and dysphoric mood.        Objective:     BP 118/70   Pulse 83   Temp 97.6 F (36.4 C)   Resp 16   Ht 5\' 1"  (1.549 m)   Wt 136 lb (61.7 kg)   LMP 06/04/2000   SpO2 97%   BMI 25.70 kg/m  Wt Readings from Last 3 Encounters:  04/21/22 136 lb (61.7 kg)  01/17/22 136 lb (61.7 kg)  01/14/22 135 lb 9.6 oz (61.5 kg)    Physical Exam Vitals reviewed.  Constitutional:      General: She is not in acute distress.    Appearance: Normal appearance. She is well-developed.  HENT:     Head: Normocephalic and atraumatic.     Right Ear: External ear normal.     Left Ear: External ear normal.  Eyes:     General: No scleral icterus.       Right eye: No discharge.        Left eye: No discharge.     Conjunctiva/sclera: Conjunctivae normal.  Neck:     Thyroid: No thyromegaly.  Cardiovascular:     Rate and Rhythm: Normal rate and regular rhythm.  Pulmonary:     Effort: No tachypnea, accessory muscle usage or respiratory distress.     Breath sounds: Normal breath sounds. No decreased breath sounds or wheezing.  Chest:  Breasts:    Right: No inverted nipple, mass, nipple discharge or tenderness (no axillary adenopathy).     Left: No inverted nipple, mass, nipple discharge or tenderness (no axilarry adenopathy).  Abdominal:     General: Bowel sounds are normal.     Palpations: Abdomen is soft.     Tenderness: There is no abdominal tenderness.  Musculoskeletal:        General: No swelling or tenderness.     Cervical back: Neck supple.  Lymphadenopathy:     Cervical: No cervical adenopathy.  Skin:    Findings: No erythema or rash.  Neurological:     Mental Status: She is alert and oriented to person, place, and time.  Psychiatric:        Mood and Affect: Mood normal.        Behavior: Behavior normal.      Outpatient Encounter  Medications as of 04/21/2022  Medication Sig   Cyanocobalamin 1000 MCG TBCR Take by mouth.  melatonin 5 MG TABS Take 5 mg by mouth at bedtime.   meloxicam (MOBIC) 7.5 MG tablet Take 7.5 mg by mouth daily as needed.    pantoprazole (PROTONIX) 20 MG tablet TAKE 1 TO 2 TABS BY MOUTH EVERY DAY   sertraline (ZOLOFT) 50 MG tablet TAKE 1 AND 1/2 TABLET PER DAY   VITAMIN D, CHOLECALCIFEROL, PO Take 1,000 Units by mouth daily.   No facility-administered encounter medications on file as of 04/21/2022.     Lab Results  Component Value Date   WBC 4.8 01/11/2022   HGB 12.9 01/11/2022   HCT 37.8 01/11/2022   PLT 288.0 01/11/2022   GLUCOSE 101 (H) 04/19/2022   CHOL 241 (H) 04/19/2022   TRIG 189.0 (H) 04/19/2022   HDL 61.90 04/19/2022   LDLDIRECT 135.0 07/27/2021   LDLCALC 141 (H) 04/19/2022   ALT 17 04/19/2022   AST 17 04/19/2022   NA 140 04/19/2022   K 4.2 04/19/2022   CL 105 04/19/2022   CREATININE 0.84 04/19/2022   BUN 14 04/19/2022   CO2 27 04/19/2022   TSH 3.21 07/27/2021   HGBA1C 6.0 04/19/2022    MM 3D SCREEN BREAST BILATERAL  Result Date: 02/28/2022 CLINICAL DATA:  Screening. EXAM: DIGITAL SCREENING BILATERAL MAMMOGRAM WITH TOMOSYNTHESIS AND CAD TECHNIQUE: Bilateral screening digital craniocaudal and mediolateral oblique mammograms were obtained. Bilateral screening digital breast tomosynthesis was performed. The images were evaluated with computer-aided detection. COMPARISON:  Previous exam(s). ACR Breast Density Category b: There are scattered areas of fibroglandular density. FINDINGS: There are no findings suspicious for malignancy. IMPRESSION: No mammographic evidence of malignancy. A result letter of this screening mammogram will be mailed directly to the patient. RECOMMENDATION: Screening mammogram in one year. (Code:SM-B-01Y) BI-RADS CATEGORY  1: Negative. Electronically Signed   By: Sherron Ales M.D.   On: 02/28/2022 16:06       Assessment & Plan:  Routine general medical  examination at a health care facility  Health care maintenance Assessment & Plan: Physical 04/21/22.  PAP 12/17/18 - negative with negative HPV.  Mammogram 02/25/22 - Birads I. Colonoscopy 12/13/19 - internal hemorrhoids and diverticulosis.    Pure hypercholesterolemia Assessment & Plan: The 10-year ASCVD risk score (Arnett DK, et al., 2019) is: 8.4%   Values used to calculate the score:     Age: 86 years     Sex: Female     Is Non-Hispanic African American: No     Diabetic: No     Tobacco smoker: No     Systolic Blood Pressure: 118 mmHg     Is BP treated: No     HDL Cholesterol: 61.9 mg/dL     Total Cholesterol: 241 mg/dL  Low cholesterol diet and exercise.  Discussed calculated cholesterol risk.  Discussed recommendation to start a cholesterol medication.  Wants to work on diet and exercise. Follow lipid panel.   Orders: -     Basic metabolic panel; Future -     Hepatic function panel; Future -     Lipid panel; Future -     TSH; Future -     CBC with Differential/Platelet; Future  Hyperglycemia -     Hemoglobin A1c; Future  Gastroesophageal reflux disease, unspecified whether esophagitis present Assessment & Plan: Continues on protonix.     History of colonic polyps Assessment & Plan: Colonoscopy 12/13/19 - diverticulosis and internal hemorrhoids.    Stress Assessment & Plan: Increased stress.  Discussed.  On zoloft.  Increased to 75mg  q day last visit.  Doing ok on this dose.  Follow.    Dysphagia, unspecified type Assessment & Plan: Question of aspiration.  Discussed.  On PPI.  Obtain UGI with barium swallow. Ordered previously.  Never scheduled.  F/u regarding scheduling.         Einar Pheasant, MD

## 2022-04-24 ENCOUNTER — Encounter: Payer: Self-pay | Admitting: Internal Medicine

## 2022-04-24 ENCOUNTER — Telehealth: Payer: Self-pay | Admitting: Internal Medicine

## 2022-04-24 NOTE — Assessment & Plan Note (Signed)
Colonoscopy 12/13/19 - diverticulosis and internal hemorrhoids.

## 2022-04-24 NOTE — Telephone Encounter (Signed)
Swallowing evaluation ordered previously.  Never scheduled.  Can you help with this?  Thanks.

## 2022-04-24 NOTE — Assessment & Plan Note (Signed)
Question of aspiration.  Discussed.  On PPI.  Obtain UGI with barium swallow. Ordered previously.  Never scheduled.  F/u regarding scheduling.

## 2022-04-24 NOTE — Assessment & Plan Note (Signed)
Increased stress.  Discussed.  On zoloft.  Increased to 75mg  q day last visit.  Doing ok on this dose.  Follow.

## 2022-04-24 NOTE — Assessment & Plan Note (Signed)
Continues on protonix.  ?

## 2022-04-26 NOTE — Telephone Encounter (Signed)
Ok. Thank you.

## 2022-05-04 ENCOUNTER — Telehealth: Payer: Self-pay | Admitting: Internal Medicine

## 2022-05-04 NOTE — Telephone Encounter (Signed)
Lft pt vm to call ofc to sch . thanks 

## 2022-05-05 NOTE — Telephone Encounter (Signed)
Patient returned referral phone call.

## 2022-05-09 ENCOUNTER — Telehealth: Payer: Self-pay | Admitting: Internal Medicine

## 2022-05-09 NOTE — Telephone Encounter (Signed)
lft pt vm to call ofc .thanks 

## 2022-05-10 DIAGNOSIS — D2262 Melanocytic nevi of left upper limb, including shoulder: Secondary | ICD-10-CM | POA: Diagnosis not present

## 2022-05-10 DIAGNOSIS — D225 Melanocytic nevi of trunk: Secondary | ICD-10-CM | POA: Diagnosis not present

## 2022-05-10 DIAGNOSIS — Z872 Personal history of diseases of the skin and subcutaneous tissue: Secondary | ICD-10-CM | POA: Diagnosis not present

## 2022-05-10 DIAGNOSIS — L578 Other skin changes due to chronic exposure to nonionizing radiation: Secondary | ICD-10-CM | POA: Diagnosis not present

## 2022-05-10 DIAGNOSIS — L821 Other seborrheic keratosis: Secondary | ICD-10-CM | POA: Diagnosis not present

## 2022-05-10 DIAGNOSIS — D2261 Melanocytic nevi of right upper limb, including shoulder: Secondary | ICD-10-CM | POA: Diagnosis not present

## 2022-05-10 DIAGNOSIS — X32XXXA Exposure to sunlight, initial encounter: Secondary | ICD-10-CM | POA: Diagnosis not present

## 2022-05-10 DIAGNOSIS — D2272 Melanocytic nevi of left lower limb, including hip: Secondary | ICD-10-CM | POA: Diagnosis not present

## 2022-05-10 DIAGNOSIS — D2271 Melanocytic nevi of right lower limb, including hip: Secondary | ICD-10-CM | POA: Diagnosis not present

## 2022-05-18 ENCOUNTER — Telehealth: Payer: Self-pay | Admitting: Internal Medicine

## 2022-05-18 NOTE — Telephone Encounter (Signed)
Lft pt vm to call ofc to sch DG esophagus.  If pt calls back please give pt the number to call [phone]. thanks

## 2022-06-01 DIAGNOSIS — G4733 Obstructive sleep apnea (adult) (pediatric): Secondary | ICD-10-CM | POA: Diagnosis not present

## 2022-06-20 ENCOUNTER — Other Ambulatory Visit: Payer: Medicare PPO

## 2022-06-21 DIAGNOSIS — Z0100 Encounter for examination of eyes and vision without abnormal findings: Secondary | ICD-10-CM | POA: Diagnosis not present

## 2022-06-21 DIAGNOSIS — H2513 Age-related nuclear cataract, bilateral: Secondary | ICD-10-CM | POA: Diagnosis not present

## 2022-06-26 ENCOUNTER — Encounter: Payer: Self-pay | Admitting: Internal Medicine

## 2022-06-27 NOTE — Telephone Encounter (Signed)
She can continue on zoloft 100mg  q day.  I do want to see her.  Can work her in this week.  See if she can come in 06/29/22 at 8:30.  Please confirm she is ok and ok to wait until Wednesday.  Confirm no suicidal ideations.

## 2022-06-27 NOTE — Telephone Encounter (Signed)
LMTCB. Spot held on Wednesday 06/29/22 at 8:30. Please schedule patient in this spot.

## 2022-06-28 NOTE — Telephone Encounter (Signed)
Appt has been scheduled.

## 2022-06-29 ENCOUNTER — Encounter: Payer: Self-pay | Admitting: Internal Medicine

## 2022-06-29 ENCOUNTER — Ambulatory Visit: Payer: Medicare PPO | Admitting: Internal Medicine

## 2022-06-29 VITALS — BP 126/70 | HR 68 | Temp 98.3°F | Resp 16 | Ht 61.0 in | Wt 140.0 lb

## 2022-06-29 DIAGNOSIS — E7800 Pure hypercholesterolemia, unspecified: Secondary | ICD-10-CM

## 2022-06-29 DIAGNOSIS — K219 Gastro-esophageal reflux disease without esophagitis: Secondary | ICD-10-CM

## 2022-06-29 DIAGNOSIS — F32A Depression, unspecified: Secondary | ICD-10-CM | POA: Diagnosis not present

## 2022-06-29 DIAGNOSIS — F439 Reaction to severe stress, unspecified: Secondary | ICD-10-CM

## 2022-06-29 MED ORDER — SERTRALINE HCL 100 MG PO TABS: 100.0000 mg | ORAL_TABLET | Freq: Every day | ORAL | 3 refills | Status: DC

## 2022-06-29 NOTE — Progress Notes (Signed)
Subjective:    Patient ID: Jessica Peck, female    DOB: 04/13/1950, 72 y.o.   MRN: [id number]  Patient here for  Chief Complaint  Patient presents with   Depression    HPI Here as a work in appt.  Problems with increased depression.  Has lost interest in doing things.  Not sleeping well.  A good friend moved.  Another friend passed.  Cries easily.  Eating.  No nausea or vomiting.  No diarrhea.  No SI.  On zoloft.  Recently increased dose to 100mg  - just a few days ago.  Feels this may be helping some.  Discussed counseling.  Discussed referral to psychiatry.     Past Medical History:  Diagnosis Date   Chronic constipation    Esophagitis    Family history of breast cancer    Family history of colon cancer    Gastritis    EGD (hiatal hernia)   GERD (gastroesophageal reflux disease)    Hypercholesterolemia    Sleep apnea    CPAP   Vitamin D deficiency    Past Surgical History:  Procedure Laterality Date   APPENDECTOMY  1976   Family History  Problem Relation Age of Onset   Colon cancer Mother 51   COPD Mother    Heart disease Mother    Colon cancer Father        dx 72s   Breast cancer Sister 42       died age 74   Breast cancer Maternal Uncle        dx 21s   Breast cancer Paternal Aunt    Breast cancer Paternal Aunt    Social History   Socioeconomic History   Marital status: Married    Spouse name: Not on file   Number of children: 2   Years of education: Not on file   Highest education level: Not on file  Occupational History   Not on file  Tobacco Use   Smoking status: Former    Types: Cigarettes   Smokeless tobacco: Never   Tobacco comments:    Smoked socially in college  Vaping Use   Vaping Use: Never used  Substance and Sexual Activity   Alcohol use: Yes    Alcohol/week: 0.0 standard drinks of alcohol    Comment: occasional   Drug use: No   Sexual activity: Not on file  Other Topics Concern   Not on file  Social History Narrative   Not  on file   Social Determinants of Health   Financial Resource Strain: Low Risk  (08/06/2021)   Overall Financial Resource Strain (CARDIA)    Difficulty of Paying Living Expenses: Not hard at all  Food Insecurity: No Food Insecurity (08/06/2021)   Hunger Vital Sign    Worried About Running Out of Food in the Last Year: Never true    Ran Out of Food in the Last Year: Never true  Transportation Needs: No Transportation Needs (08/06/2021)   PRAPARE - Administrator, Civil Service (Medical): No    Lack of Transportation (Non-Medical): No  Physical Activity: Unknown (07/01/2020)   Exercise Vital Sign    Days of Exercise per Week: 0 days    Minutes of Exercise per Session: Not on file  Stress: No Stress Concern Present (08/06/2021)   [employer] of Occupational Health - Occupational Stress Questionnaire    Feeling of Stress : Not at all  Social Connections: Unknown (08/06/2021)   Social Connection and  Isolation Panel [NHANES]    Frequency of Communication with Friends and Family: Not on file    Frequency of Social Gatherings with Friends and Family: Not on file    Attends Religious Services: Not on file    Active Member of Clubs or Organizations: Not on file    Attends BankerClub or Organization Meetings: Not on file    Marital Status: Married     Review of Systems  Constitutional:  Negative for appetite change and unexpected weight change.  HENT:  Negative for congestion and sinus pressure.   Respiratory:  Negative for cough, chest tightness and shortness of breath.   Cardiovascular:  Negative for chest pain, palpitations and leg swelling.  Gastrointestinal:  Negative for abdominal pain, diarrhea, nausea and vomiting.  Genitourinary:  Negative for difficulty urinating and dysuria.  Musculoskeletal:  Negative for joint swelling and myalgias.  Skin:  Negative for color change and rash.  Neurological:  Negative for dizziness and headaches.  Psychiatric/Behavioral:          Increased depression as outlined.         Objective:     BP 126/70   Pulse 68   Temp 98.3 F (36.8 C)   Resp 16   Ht 5\' 1"  (1.549 m)   Wt 140 lb (63.5 kg)   LMP 06/04/2000   SpO2 98%   BMI 26.45 kg/m  Wt Readings from Last 3 Encounters:  06/29/22 140 lb (63.5 kg)  04/21/22 136 lb (61.7 kg)  01/17/22 136 lb (61.7 kg)    Physical Exam Vitals reviewed.  Constitutional:      General: She is not in acute distress.    Appearance: Normal appearance.  HENT:     Head: Normocephalic and atraumatic.     Right Ear: External ear normal.     Left Ear: External ear normal.  Eyes:     General: No scleral icterus.       Right eye: No discharge.        Left eye: No discharge.     Conjunctiva/sclera: Conjunctivae normal.  Neck:     Thyroid: No thyromegaly.  Cardiovascular:     Rate and Rhythm: Normal rate and regular rhythm.  Pulmonary:     Effort: No respiratory distress.     Breath sounds: Normal breath sounds. No wheezing.  Abdominal:     General: Bowel sounds are normal.     Palpations: Abdomen is soft.     Tenderness: There is no abdominal tenderness.  Musculoskeletal:        General: No swelling or tenderness.     Cervical back: Neck supple. No tenderness.  Lymphadenopathy:     Cervical: No cervical adenopathy.  Skin:    Findings: No erythema or rash.  Neurological:     Mental Status: She is alert.  Psychiatric:        Mood and Affect: Mood normal.        Behavior: Behavior normal.      Outpatient Encounter Medications as of 06/29/2022  Medication Sig   [DISCONTINUED] sertraline (ZOLOFT) 100 MG tablet Take 1 tablet (100 mg total) by mouth daily.   Cyanocobalamin 1000 MCG TBCR Take by mouth.   melatonin 5 MG TABS Take 5 mg by mouth at bedtime.   meloxicam (MOBIC) 7.5 MG tablet Take 7.5 mg by mouth daily as needed.    pantoprazole (PROTONIX) 20 MG tablet TAKE 1 TO 2 TABS BY MOUTH EVERY DAY   sertraline (ZOLOFT) 100 MG tablet  Take 1 tablet (100 mg total) by mouth  daily.   VITAMIN D, CHOLECALCIFEROL, PO Take 1,000 Units by mouth daily.   [DISCONTINUED] sertraline (ZOLOFT) 50 MG tablet TAKE 1 AND 1/2 TABLET PER DAY (Patient taking differently: Take 100 mg by mouth daily. TAKE 2 TABLETS BY MOUTH DAILY)   No facility-administered encounter medications on file as of 06/29/2022.     Lab Results  Component Value Date   WBC 4.8 01/11/2022   HGB 12.9 01/11/2022   HCT 37.8 01/11/2022   PLT 288.0 01/11/2022   GLUCOSE 101 (H) 04/19/2022   CHOL 241 (H) 04/19/2022   TRIG 189.0 (H) 04/19/2022   HDL 61.90 04/19/2022   LDLDIRECT 135.0 07/27/2021   LDLCALC 141 (H) 04/19/2022   ALT 17 04/19/2022   AST 17 04/19/2022   NA 140 04/19/2022   K 4.2 04/19/2022   CL 105 04/19/2022   CREATININE 0.84 04/19/2022   BUN 14 04/19/2022   CO2 27 04/19/2022   TSH 3.21 07/27/2021   HGBA1C 6.0 04/19/2022    MM 3D SCREEN BREAST BILATERAL  Result Date: 02/28/2022 CLINICAL DATA:  Screening. EXAM: DIGITAL SCREENING BILATERAL MAMMOGRAM WITH TOMOSYNTHESIS AND CAD TECHNIQUE: Bilateral screening digital craniocaudal and mediolateral oblique mammograms were obtained. Bilateral screening digital breast tomosynthesis was performed. The images were evaluated with computer-aided detection. COMPARISON:  Previous exam(s). ACR Breast Density Category b: There are scattered areas of fibroglandular density. FINDINGS: There are no findings suspicious for malignancy. IMPRESSION: No mammographic evidence of malignancy. A result letter of this screening mammogram will be mailed directly to the patient. RECOMMENDATION: Screening mammogram in one year. (Code:SM-B-01Y) BI-RADS CATEGORY  1: Negative. Electronically Signed   By: Sherron AlesLaura  Parra M.D.   On: 02/28/2022 16:06       Assessment & Plan:  Stress Assessment & Plan: Increased stress and depression. Discussed.  No SI.  Has lost interest in doing things she used to enjoy.  Crying easily.  She increased her dose to 100mg  a few days ago.  Does feel  this is helping.  Will continue zoloft 100mg  q day.  Discussed referral to psychiatry and discussed counseling.  Agreeable.  Follow.    Pure hypercholesterolemia Assessment & Plan: The 10-year ASCVD risk score (Arnett DK, et al., 2019) is: 9.5%   Values used to calculate the score:     Age: [age] years     Sex: Female     Is Non-Hispanic African American: No     Diabetic: No     Tobacco smoker: No     Systolic Blood Pressure: 126 mmHg     Is BP treated: No     HDL Cholesterol: 61.9 mg/dL     Total Cholesterol: 241 mg/dL  Low cholesterol diet and exercise. Follow lipid panel.    Gastroesophageal reflux disease, unspecified whether esophagitis present Assessment & Plan: Continues on protonix.     Depression, unspecified depression type Assessment & Plan: Increased stress and depression. Discussed.  No SI.  Has lost interest in doing things she used to enjoy.  Crying easily.  She increased her dose to 100mg  a few days ago.  Does feel this is helping.  Will continue zoloft 100mg  q day.  Discussed referral to psychiatry and discussed counseling.  Agreeable.  Follow.    Other orders -     Sertraline HCl; Take 1 tablet (100 mg total) by mouth daily.  Dispense: 30 tablet; Refill: 3     Dale Durhamharlene Lynsi Dooner, MD

## 2022-07-01 ENCOUNTER — Encounter: Payer: Self-pay | Admitting: Internal Medicine

## 2022-07-03 ENCOUNTER — Encounter: Payer: Self-pay | Admitting: Internal Medicine

## 2022-07-03 DIAGNOSIS — F32A Depression, unspecified: Secondary | ICD-10-CM | POA: Insufficient documentation

## 2022-07-03 NOTE — Assessment & Plan Note (Signed)
The 10-year ASCVD risk score (Arnett DK, et al., 2019) is: 9.5%   Values used to calculate the score:     Age: 72 years     Sex: Female     Is Non-Hispanic African American: No     Diabetic: No     Tobacco smoker: No     Systolic Blood Pressure: 126 mmHg     Is BP treated: No     HDL Cholesterol: 61.9 mg/dL     Total Cholesterol: 241 mg/dL  Low cholesterol diet and exercise. Follow lipid panel.

## 2022-07-03 NOTE — Assessment & Plan Note (Signed)
Increased stress and depression. Discussed.  No SI.  Has lost interest in doing things she used to enjoy.  Crying easily.  She increased her dose to 100mg  a few days ago.  Does feel this is helping.  Will continue zoloft 100mg  q day.  Discussed referral to psychiatry and discussed counseling.  Agreeable.  Follow.

## 2022-07-03 NOTE — Assessment & Plan Note (Signed)
Increased stress and depression. Discussed.  No SI.  Has lost interest in doing things she used to enjoy.  Crying easily.  She increased her dose to 100mg a few days ago.  Does feel this is helping.  Will continue zoloft 100mg q day.  Discussed referral to psychiatry and discussed counseling.  Agreeable.  Follow.  

## 2022-07-03 NOTE — Assessment & Plan Note (Signed)
Continues on protonix.  ?

## 2022-07-06 ENCOUNTER — Encounter: Payer: Self-pay | Admitting: Internal Medicine

## 2022-07-06 ENCOUNTER — Ambulatory Visit (INDEPENDENT_AMBULATORY_CARE_PROVIDER_SITE_OTHER): Payer: Medicare PPO | Admitting: Psychology

## 2022-07-06 DIAGNOSIS — M7061 Trochanteric bursitis, right hip: Secondary | ICD-10-CM | POA: Diagnosis not present

## 2022-07-06 DIAGNOSIS — F330 Major depressive disorder, recurrent, mild: Secondary | ICD-10-CM | POA: Diagnosis not present

## 2022-07-06 DIAGNOSIS — M7060 Trochanteric bursitis, unspecified hip: Secondary | ICD-10-CM | POA: Insufficient documentation

## 2022-07-06 NOTE — Progress Notes (Addendum)
Behavioral Health Counselor Initial Adult Exam  Name: Jessica Peck Date: 07/06/2022 MRN: [id number] DOB: 29-Dec-1950 PCP: Dale [place], MD  Time spent: 60 minutes  Guardian/Payee:  self  Paperwork requested: No   Reason for Visit /Presenting Problem: The patient attended the initial diagnostic evaluation via web ex.  The patient gave verbal consent for the session to be on web ex by video.  The patient was in her home and therapist was in the office.Issues with feeling depressed and sad.  Having trouble sleeping and feeling blue.  Mental Status Exam: Appearance:   Casual     Behavior:  Appropriate  Motor:  Normal  Speech/Language:   Clear and Coherent  Affect:  Blunt  Mood:  depressed  Thought process:  normal  Thought content:    WNL  Sensory/Perceptual disturbances:    WNL  Orientation:  oriented to person, place, time/date, and situation  Attention:  Good  Concentration:  Good  Memory:  WNL  Fund of knowledge:   Good  Insight:    Good  Judgment:   Good  Impulse Control:  Good    Reported Symptoms:  depressed mood, problems sleeping  Risk Assessment: Danger to Self:  No Self-injurious Behavior: No Danger to Others: No Duty to Warn:no Physical Aggression / Violence:No  Access to Firearms a concern: No  Gang Involvement:No  Patient / guardian was educated about steps to take if suicide or homicide risk level increases between visits: n/a While future psychiatric events cannot be accurately predicted, the patient does not currently require acute inpatient psychiatric care and does not currently meet [facility] involuntary commitment criteria.  Substance Abuse History: Current substance abuse: No     Past Psychiatric History:   Previous psychological history is significant for depression Outpatient Providers:PCP History of Psych Hospitalization: No  Psychological Testing: n/a  Abuse History:  Victim of: No., n/a Report needed: No. Victim  of Neglect:No. Perpetrator of no Witness / Exposure to Domestic Violence: No   Protective Services Involvement: No  Witness to [employer] Violence:  No   Family History:  Family History  Problem Relation Age of Onset   Colon cancer Mother 52   COPD Mother    Heart disease Mother    Colon cancer Father        dx 79s   Breast cancer Sister 75       died age 22   Breast cancer Maternal Uncle        dx 42s   Breast cancer Paternal Aunt    Breast cancer Paternal Aunt     Living situation: the patient lives with their spouse  Sexual Orientation: Straight  Relationship Status: married  Name of spouse / other:David If a parent, number of children / ages:Two grown children  Support Systems: spouse  Surveyor, quantity Stress:  No   Income/Employment/Disability: Dance movement psychotherapist and Occupational psychologist Service: No   Educational History: Education: Risk manager: Protestant  Any cultural differences that may affect / interfere with treatment:  not applicable   Recreation/Hobbies: Doesn't have many hobbies  Stressors: Difficulty with aging  Strengths: Supportive Relationships, Family, and Hopefulness  Barriers:  None  Legal History: Pending legal issue / charges: The patient has no significant history of legal issues. History of legal issue / charges: n/a  Medical History/Surgical History: reviewed Past Medical History:  Diagnosis Date   Chronic constipation    Esophagitis    Family history of breast cancer  Family history of colon cancer    Gastritis    EGD (hiatal hernia)   GERD (gastroesophageal reflux disease)    Hypercholesterolemia    Sleep apnea    CPAP   Vitamin D deficiency     Past Surgical History:  Procedure Laterality Date   APPENDECTOMY  1976    Medications: Current Outpatient Medications  Medication Sig Dispense Refill   Cyanocobalamin 1000 MCG TBCR Take by mouth.     melatonin 5 MG TABS Take 5 mg  by mouth at bedtime.     meloxicam (MOBIC) 7.5 MG tablet Take 7.5 mg by mouth daily as needed.      pantoprazole (PROTONIX) 20 MG tablet TAKE 1 TO 2 TABS BY MOUTH EVERY DAY 180 tablet 1   sertraline (ZOLOFT) 100 MG tablet Take 1 tablet (100 mg total) by mouth daily. 30 tablet 3   VITAMIN D, CHOLECALCIFEROL, PO Take 1,000 Units by mouth daily.     No current facility-administered medications for this visit.    Allergies  Allergen Reactions   Penicillins Swelling    Diagnoses:  Major depressive disorder, recurrent episode, mild  Plan of Care: Will develop plan of care at next visit.   Eliabeth Shoff G Blaike Newburn, LCSW

## 2022-07-07 ENCOUNTER — Other Ambulatory Visit: Payer: Self-pay | Admitting: Internal Medicine

## 2022-07-07 ENCOUNTER — Ambulatory Visit
Admission: RE | Admit: 2022-07-07 | Discharge: 2022-07-07 | Disposition: A | Discharge status: Home / Self Care | Payer: Medicare PPO | Source: Ambulatory Visit | Attending: Internal Medicine | Admitting: Internal Medicine

## 2022-07-07 DIAGNOSIS — R1310 Dysphagia, unspecified: Secondary | ICD-10-CM | POA: Diagnosis not present

## 2022-07-08 ENCOUNTER — Telehealth: Payer: Self-pay

## 2022-07-08 NOTE — Telephone Encounter (Signed)
-----   Message from Dale Boyd, MD sent at 07/08/2022  4:38 AM EDT ----- Please call and notify - barium swallow/esophagus study - ok.  Given persistent issues, can refer to speech therapy for further evaluation and treatment.

## 2022-07-08 NOTE — Telephone Encounter (Signed)
LMTCb ok to give results.

## 2022-07-08 NOTE — Telephone Encounter (Signed)
Patient states she is returning a call from Rita Ohara, LPN.  I read Dr. Westley Hummer Scott's message to patient.  Patient states she has an upcoming appointment with Dr. Lorin Picket and would like to hold off on the referral to speech therapy until she has a chance to discuss it with Dr. Lorin Picket.

## 2022-07-11 NOTE — Telephone Encounter (Signed)
Noted  

## 2022-07-26 ENCOUNTER — Encounter: Payer: Self-pay | Admitting: Internal Medicine

## 2022-07-26 ENCOUNTER — Ambulatory Visit: Payer: Medicare PPO | Admitting: Internal Medicine

## 2022-07-26 VITALS — BP 122/74 | HR 76 | Temp 98.3°F | Resp 16 | Ht 61.0 in | Wt 139.2 lb

## 2022-07-26 DIAGNOSIS — F439 Reaction to severe stress, unspecified: Secondary | ICD-10-CM

## 2022-07-26 DIAGNOSIS — F32A Depression, unspecified: Secondary | ICD-10-CM | POA: Diagnosis not present

## 2022-07-26 DIAGNOSIS — M25551 Pain in right hip: Secondary | ICD-10-CM

## 2022-07-26 DIAGNOSIS — E7800 Pure hypercholesterolemia, unspecified: Secondary | ICD-10-CM

## 2022-07-26 DIAGNOSIS — K219 Gastro-esophageal reflux disease without esophagitis: Secondary | ICD-10-CM | POA: Diagnosis not present

## 2022-07-26 MED ORDER — SERTRALINE HCL 100 MG PO TABS: 100.0000 mg | ORAL_TABLET | Freq: Every day | ORAL | 1 refills | Status: DC

## 2022-07-26 NOTE — Progress Notes (Signed)
Subjective:    Patient ID: Jessica Peck, female    DOB: 1950-12-10, 72 y.o.   MRN: [id number]  Patient here for  Chief Complaint  Patient presents with   Medical Management of Chronic Issues    HPI Here to follow up regarding increased stress and depression.  Evaluated 06/29/22 - started on zoloft.  Reports doing well on zoloft.  Feels better.  This dose is working well.  Seeing a Veterinary surgeon. This is going well.  Seeing [facility] - diagnosed with trochanteric bursitis right hip.  S/p injection.  Did not help as well.  Does limit walking.  Husband is doing PT with her.  Also doing yoga.  Will notify if desires further intervention.  No chest pain or sob reported.  No abdominal pain or bowel change reported.    Past Medical History:  Diagnosis Date   Chronic constipation    Esophagitis    Family history of breast cancer    Family history of colon cancer    Gastritis    EGD (hiatal hernia)   GERD (gastroesophageal reflux disease)    Hypercholesterolemia    Sleep apnea    CPAP   Vitamin D deficiency    Past Surgical History:  Procedure Laterality Date   APPENDECTOMY  1976   Family History  Problem Relation Age of Onset   Colon cancer Mother 79   COPD Mother    Heart disease Mother    Colon cancer Father        dx 71s   Breast cancer Sister 34       died age 102   Breast cancer Maternal Uncle        dx 49s   Breast cancer Paternal Aunt    Breast cancer Paternal Aunt    Social History   Socioeconomic History   Marital status: Married    Spouse name: Not on file   Number of children: 2   Years of education: Not on file   Highest education level: Not on file  Occupational History   Not on file  Tobacco Use   Smoking status: Former    Types: Cigarettes   Smokeless tobacco: Never   Tobacco comments:    Smoked socially in college  Vaping Use   Vaping Use: Never used  Substance and Sexual Activity   Alcohol use: Yes    Alcohol/week: 0.0 standard drinks of  alcohol    Comment: occasional   Drug use: No   Sexual activity: Not on file  Other Topics Concern   Not on file  Social History Narrative   Not on file   Social Determinants of Health   Financial Resource Strain: Low Risk  (08/06/2021)   Overall Financial Resource Strain (CARDIA)    Difficulty of Paying Living Expenses: Not hard at all  Food Insecurity: No Food Insecurity (08/06/2021)   Hunger Vital Sign    Worried About Running Out of Food in the Last Year: Never true    Ran Out of Food in the Last Year: Never true  Transportation Needs: No Transportation Needs (08/06/2021)   PRAPARE - Administrator, Civil Service (Medical): No    Lack of Transportation (Non-Medical): No  Physical Activity: Unknown (07/01/2020)   Exercise Vital Sign    Days of Exercise per Week: 0 days    Minutes of Exercise per Session: Not on file  Stress: No Stress Concern Present (08/06/2021)   [employer] of Occupational Health - Occupational  Stress Questionnaire    Feeling of Stress : Not at all  Social Connections: Unknown (08/06/2021)   Social Connection and Isolation Panel [NHANES]    Frequency of Communication with Friends and Family: Not on file    Frequency of Social Gatherings with Friends and Family: Not on file    Attends Religious Services: Not on file    Active Member of Clubs or Organizations: Not on file    Attends Banker Meetings: Not on file    Marital Status: Married     Review of Systems  Constitutional:  Negative for appetite change and unexpected weight change.  HENT:  Negative for congestion and sinus pressure.   Respiratory:  Negative for cough, chest tightness and shortness of breath.   Cardiovascular:  Negative for chest pain and palpitations.  Gastrointestinal:  Negative for abdominal pain, diarrhea, nausea and vomiting.  Genitourinary:  Negative for difficulty urinating and dysuria.  Musculoskeletal:  Negative for joint swelling and myalgias.        Right hip pain as outlined.    Skin:  Negative for color change and rash.  Neurological:  Negative for dizziness and headaches.  Psychiatric/Behavioral:  Negative for agitation and dysphoric mood.        Objective:     BP 122/74   Pulse 76   Temp 98.3 F (36.8 C)   Resp 16   Ht 5\' 1"  (1.549 m)   Wt 139 lb 3.2 oz (63.1 kg)   LMP 06/04/2000   SpO2 98%   BMI 26.30 kg/m  Wt Readings from Last 3 Encounters:  07/26/22 139 lb 3.2 oz (63.1 kg)  06/29/22 140 lb (63.5 kg)  04/21/22 136 lb (61.7 kg)    Physical Exam Vitals reviewed.  Constitutional:      General: She is not in acute distress.    Appearance: Normal appearance.  HENT:     Head: Normocephalic and atraumatic.     Right Ear: External ear normal.     Left Ear: External ear normal.  Eyes:     General: No scleral icterus.       Right eye: No discharge.        Left eye: No discharge.     Conjunctiva/sclera: Conjunctivae normal.  Neck:     Thyroid: No thyromegaly.  Cardiovascular:     Rate and Rhythm: Normal rate and regular rhythm.  Pulmonary:     Effort: No respiratory distress.     Breath sounds: Normal breath sounds. No wheezing.  Abdominal:     General: Bowel sounds are normal.     Palpations: Abdomen is soft.     Tenderness: There is no abdominal tenderness.  Musculoskeletal:        General: No swelling or tenderness.     Cervical back: Neck supple. No tenderness.  Lymphadenopathy:     Cervical: No cervical adenopathy.  Skin:    Findings: No erythema or rash.  Neurological:     Mental Status: She is alert.  Psychiatric:        Mood and Affect: Mood normal.        Behavior: Behavior normal.      Outpatient Encounter Medications as of 07/26/2022  Medication Sig   Cyanocobalamin 1000 MCG TBCR Take by mouth.   melatonin 5 MG TABS Take 5 mg by mouth at bedtime.   meloxicam (MOBIC) 7.5 MG tablet Take 7.5 mg by mouth daily as needed.    pantoprazole (PROTONIX) 20 MG tablet TAKE 1  TO 2 TABS BY  MOUTH EVERY DAY   VITAMIN D, CHOLECALCIFEROL, PO Take 1,000 Units by mouth daily.   [DISCONTINUED] sertraline (ZOLOFT) 100 MG tablet Take 1 tablet (100 mg total) by mouth daily.   sertraline (ZOLOFT) 100 MG tablet Take 1 tablet (100 mg total) by mouth daily.   No facility-administered encounter medications on file as of 07/26/2022.     Lab Results  Component Value Date   WBC 4.8 01/11/2022   HGB 12.9 01/11/2022   HCT 37.8 01/11/2022   PLT 288.0 01/11/2022   GLUCOSE 101 (H) 04/19/2022   CHOL 241 (H) 04/19/2022   TRIG 189.0 (H) 04/19/2022   HDL 61.90 04/19/2022   LDLDIRECT 135.0 07/27/2021   LDLCALC 141 (H) 04/19/2022   ALT 17 04/19/2022   AST 17 04/19/2022   NA 140 04/19/2022   K 4.2 04/19/2022   CL 105 04/19/2022   CREATININE 0.84 04/19/2022   BUN 14 04/19/2022   CO2 27 04/19/2022   TSH 3.21 07/27/2021   HGBA1C 6.0 04/19/2022    DG ESOPHAGUS W DOUBLE CM (HD)  Result Date: 07/07/2022 INDICATION: Patient complains of intermittent choking on her saliva, she denies solid food or pill dysphagia. EXAM: ESOPHAGUS/BARIUM SWALLOW/TABLET STUDY TECHNIQUE: Combined double and single contrast examination was performed using effervescent crystals, thick barium liquid and thin barium liquid. The patient was observed with fluoroscopy swallowing a 13 mm barium sulphate tablet. FLUOROSCOPY TIME:  Radiation Exposure Index (as provided by the fluoroscopic device): 13.70 mGy COMPARISON:  None available. PROCEDURE: Normal pharyngeal anatomy and motility. No laryngeal penetration or tracheal aspiration. Contrast flowed freely through the esophagus without evidence of a stricture or mass. Normal esophageal mucosa without evidence of irregularity or ulceration. No esophageal dysmotility is seen. No evidence of reflux. No definite hiatal hernia was demonstrated. A 13 mm barium tablet was administered which transited through the esophagus and esophagogastric junction without delay. COMPLICATIONS: None.  IMPRESSION: Unremarkable esophagram. This exam was performed by Pattricia Boss PA-C, and was supervised and interpreted by Dr. Sebastian Ache. Electronically Signed   By: Sebastian Ache M.D.   On: 07/07/2022 11:32       Assessment & Plan:  Depression, unspecified depression type Assessment & Plan: Increased stress and depression - discussed last visit. Started on zoloft.  Feels better.  This dose is working well.  Continue.   Continue seeing her therapist.    Gastroesophageal reflux disease, unspecified whether esophagitis present Assessment & Plan: Continues on protonix.     Pure hypercholesterolemia Assessment & Plan: The 10-year ASCVD risk score (Arnett DK, et al., 2019) is: 9%   Values used to calculate the score:     Age: 40 years     Sex: Female     Is Non-Hispanic African American: No     Diabetic: No     Tobacco smoker: No     Systolic Blood Pressure: 122 mmHg     Is BP treated: No     HDL Cholesterol: 61.9 mg/dL     Total Cholesterol: 241 mg/dL  Low cholesterol diet and exercise. Follow lipid panel.    Right hip pain Assessment & Plan: S/p injection.  Continue therapy and yoga. Follow.    Stress Assessment & Plan: On zoloft now.  Doing better.  Follow.  Continue current dose.  No changes.    Other orders -     Sertraline HCl; Take 1 tablet (100 mg total) by mouth daily.  Dispense: 90 tablet; Refill: 1  Patrick Salemi, MD 

## 2022-07-27 ENCOUNTER — Telehealth: Payer: Self-pay | Admitting: Internal Medicine

## 2022-07-27 NOTE — Telephone Encounter (Signed)
Contacted Jessica Peck to schedule their annual wellness visit. Call back at later date: 08/01/2022 patient is out of town  [facility]; Care Guide Ambulatory Clinical Support [facility] Direct Dial: [phone]

## 2022-07-29 ENCOUNTER — Ambulatory Visit (INDEPENDENT_AMBULATORY_CARE_PROVIDER_SITE_OTHER): Payer: Medicare PPO | Admitting: Psychology

## 2022-07-29 DIAGNOSIS — F330 Major depressive disorder, recurrent, mild: Secondary | ICD-10-CM

## 2022-07-29 NOTE — Progress Notes (Signed)
    [facility] Counselor/Therapist Progress Note  Patient ID: Jessica Peck, MRN: [id number],    Date: 07/29/2022  Time Spent: 45 minutes  Treatment Type: Individual Therapy  Reported Symptoms: The patient reports feeling depressed and anxious.  Mental Status Exam: Appearance:  Casual     Behavior: Appropriate  Motor: Normal  Speech/Language:  Clear and Coherent  Affect: Appropriate  Mood: pleasant  Thought process: normal  Thought content:   WNL  Sensory/Perceptual disturbances:   WNL  Orientation: oriented to person, place, time/date, and situation  Attention: Good  Concentration: Good  Memory: WNL  Fund of knowledge:  Good  Insight:   Good  Judgment:  Good  Impulse Control: Good   Risk Assessment: Danger to Self:  No Self-injurious Behavior: No Danger to Others: No Duty to Warn:no Physical Aggression / Violence:No  Access to Firearms a concern: No  Gang Involvement:No   Subjective: The patient attended an individual therapy session via video visit.  The patient gave verbal consent for the session to be on caregility.  The patient was in her home alone and the therapist was in the office.  The patient reports that she feels much better now.  She states that she feels like her medication is working and she has done a couple of things to feel better about moving forward in her life.  She reports that she signed up for pickle ball lessons and she has thought about doing some other things and feels good about that.  We talked about her moving forward and spending the summer exploring what it is that she would like to do moving forward.  She felt that this was a good opportunity for her to explore things.  The patient has decided that she would like to come back if needed but at this point we did not schedule any further sessions.  Interventions: Cognitive Behavioral Therapy and Insight-Oriented  Diagnosis:Major depressive disorder, recurrent episode, mild  (HCC)  Plan: Will not do plan at this time because patient states that she will come back to therapy if needed.  Will do one if she returns. No target dates needed at this time because she is not continuing therapy.  Elden Brucato G Annel Zunker, LCSW

## 2022-07-31 ENCOUNTER — Encounter: Payer: Self-pay | Admitting: Internal Medicine

## 2022-07-31 NOTE — Assessment & Plan Note (Signed)
Increased stress and depression - discussed last visit. Started on zoloft.  Feels better.  This dose is working well.  Continue.   Continue seeing her therapist.

## 2022-07-31 NOTE — Assessment & Plan Note (Signed)
The 10-year ASCVD risk score (Arnett DK, et al., 2019) is: 9%   Values used to calculate the score:     Age: 72 years     Sex: Female     Is Non-Hispanic African American: No     Diabetic: No     Tobacco smoker: No     Systolic Blood Pressure: 122 mmHg     Is BP treated: No     HDL Cholesterol: 61.9 mg/dL     Total Cholesterol: 241 mg/dL  Low cholesterol diet and exercise. Follow lipid panel.

## 2022-07-31 NOTE — Assessment & Plan Note (Signed)
On zoloft now.  Doing better.  Follow.  Continue current dose.  No changes.

## 2022-07-31 NOTE — Assessment & Plan Note (Signed)
S/p injection.  Continue therapy and yoga. Follow.

## 2022-07-31 NOTE — Assessment & Plan Note (Signed)
Continues on protonix.  ?

## 2022-08-17 ENCOUNTER — Other Ambulatory Visit: Payer: Medicare PPO

## 2022-08-18 ENCOUNTER — Other Ambulatory Visit: Payer: Medicare PPO

## 2022-08-23 ENCOUNTER — Ambulatory Visit: Payer: Medicare PPO | Admitting: Internal Medicine

## 2022-08-23 DIAGNOSIS — M7061 Trochanteric bursitis, right hip: Secondary | ICD-10-CM | POA: Diagnosis not present

## 2022-08-26 ENCOUNTER — Ambulatory Visit: Payer: Medicare PPO | Admitting: Internal Medicine

## 2022-09-06 ENCOUNTER — Other Ambulatory Visit: Payer: Self-pay

## 2022-09-06 ENCOUNTER — Encounter: Payer: Self-pay | Admitting: Emergency Medicine

## 2022-09-06 ENCOUNTER — Emergency Department
Admission: EM | Admit: 2022-09-06 | Discharge: 2022-09-06 | Disposition: A | Discharge status: Home / Self Care | Payer: Medicare PPO | Attending: Emergency Medicine | Admitting: Emergency Medicine

## 2022-09-06 DIAGNOSIS — R112 Nausea with vomiting, unspecified: Secondary | ICD-10-CM | POA: Insufficient documentation

## 2022-09-06 DIAGNOSIS — R9431 Abnormal electrocardiogram [ECG] [EKG]: Secondary | ICD-10-CM | POA: Diagnosis not present

## 2022-09-06 DIAGNOSIS — K529 Noninfective gastroenteritis and colitis, unspecified: Secondary | ICD-10-CM

## 2022-09-06 DIAGNOSIS — D72829 Elevated white blood cell count, unspecified: Secondary | ICD-10-CM | POA: Insufficient documentation

## 2022-09-06 DIAGNOSIS — R197 Diarrhea, unspecified: Secondary | ICD-10-CM | POA: Insufficient documentation

## 2022-09-06 DIAGNOSIS — R1111 Vomiting without nausea: Secondary | ICD-10-CM | POA: Diagnosis not present

## 2022-09-06 LAB — COMPREHENSIVE METABOLIC PANEL
ALT: 27 U/L (ref 0–44)
AST: 31 U/L (ref 15–41)
Albumin: 4.5 g/dL (ref 3.5–5.0)
Alkaline Phosphatase: 58 U/L (ref 38–126)
Anion gap: 16 — ABNORMAL HIGH (ref 5–15)
BUN: 25 mg/dL — ABNORMAL HIGH (ref 8–23)
CO2: 18 mmol/L — ABNORMAL LOW (ref 22–32)
Calcium: 9.4 mg/dL (ref 8.9–10.3)
Chloride: 103 mmol/L (ref 98–111)
Creatinine, Ser: 0.89 mg/dL (ref 0.44–1.00)
GFR, Estimated: >60 mL/min (ref >60)
Glucose, Bld: 174 mg/dL — ABNORMAL HIGH (ref 70–99)
Potassium: 3.9 mmol/L (ref 3.5–5.1)
Sodium: 137 mmol/L (ref 135–145)
Total Bilirubin: 0.7 mg/dL (ref 0.3–1.2)
Total Protein: 7.7 g/dL (ref 6.5–8.1)

## 2022-09-06 LAB — CBC
HCT: 42.3 % (ref 36.0–46.0)
Hemoglobin: 13.8 g/dL (ref 12.0–15.0)
MCH: 29.7 pg (ref 26.0–34.0)
MCHC: 32.6 g/dL (ref 30.0–36.0)
MCV: 91.2 fL (ref 80.0–100.0)
Platelets: 283 10*3/uL (ref 150–400)
RBC: 4.64 MIL/uL (ref 3.87–5.11)
RDW: 14.1 % (ref 11.5–15.5)
WBC: 12.5 10*3/uL — ABNORMAL HIGH (ref 4.0–10.5)
nRBC: 0 % (ref 0.0–0.2)

## 2022-09-06 LAB — URINALYSIS, ROUTINE W REFLEX MICROSCOPIC
Bilirubin Urine: NEGATIVE
Glucose, UA: NEGATIVE mg/dL
Hgb urine dipstick: NEGATIVE
Ketones, ur: NEGATIVE mg/dL
Leukocytes,Ua: NEGATIVE
Nitrite: NEGATIVE
Protein, ur: NEGATIVE mg/dL
Specific Gravity, Urine: 1.015 (ref 1.005–1.030)
pH: 6 (ref 5.0–8.0)

## 2022-09-06 LAB — LIPASE, BLOOD: Lipase: 45 U/L (ref 11–51)

## 2022-09-06 MED ORDER — SODIUM CHLORIDE 0.9 % IV BOLUS
1000.0000 mL | Freq: Once | INTRAVENOUS | Status: AC
  Administered 2022-09-06: 1000 mL via INTRAVENOUS

## 2022-09-06 MED ORDER — ONDANSETRON HCL 4 MG/2ML IJ SOLN
4.0000 mg | Freq: Once | INTRAMUSCULAR | Status: AC
  Administered 2022-09-06: 4 mg via INTRAVENOUS
  Filled 2022-09-06: qty 2

## 2022-09-06 MED ORDER — ONDANSETRON 4 MG PO TBDP: 4.0000 mg | ORAL_TABLET | Freq: Three times a day (TID) | ORAL | 0 refills | Status: DC | PRN

## 2022-09-06 NOTE — ED Notes (Signed)
Dr. Lenard Lance at bedside for evaluation.

## 2022-09-06 NOTE — ED Provider Notes (Signed)
[facility] Provider Note    Event Date/Time   First MD Initiated Contact with Patient 09/06/22 [phone]     (approximate)  History   Chief Complaint: Emesis and Diarrhea  HPI  Jessica Peck is a 72 y.o. female with a past medical history of esophagitis, gastric reflux, gastritis, presents to the emergency department for nausea and vomiting overnight.  According to the patient since last night she has been nauseated with frequent episodes of vomiting and diarrhea overnight.  Patient states she thought it was a stomach virus but she continued to have vomiting this morning so she came to the emergency department for evaluation.  Patient denies any fever.  States mild diffuse abdominal cramping but denies any focal abdominal pain.  Patient states she is status post cholecystectomy and appendectomy years ago.  Physical Exam   Triage Vital Signs: ED Triage Vitals  Enc Vitals Group     BP 09/06/22 0714 127/67     Pulse Rate 09/06/22 0714 62     Resp 09/06/22 0714 19     Temp 09/06/22 0714 (!) 97.4 F (36.3 C)     Temp Source 09/06/22 0714 Oral     SpO2 09/06/22 0714 100 %     Weight 09/06/22 0715 138 lb (62.6 kg)     Height 09/06/22 0715 5' (1.524 m)     Head Circumference --      Peak Flow --      Pain Score 09/06/22 0723 7     Pain Loc --      Pain Edu? --      Excl. in GC? --     Most recent vital signs: Vitals:   09/06/22 0714  BP: 127/67  Pulse: 62  Resp: 19  Temp: (!) 97.4 F (36.3 C)  SpO2: 100%    General: Awake, no distress.  CV:  Good peripheral perfusion.   Resp:  Normal effort.  Abd:  No distention.  Soft, mild diffuse tenderness more so in the epigastrium.  No rebound or guarding.   ED Results / Procedures / Treatments   EKG  EKG viewed and interpreted by myself shows a normal sinus rhythm at 60 bpm with a narrow QRS, normal axis, normal intervals, no concerning ST changes.  MEDICATIONS ORDERED IN ED: Medications  sodium  chloride 0.9 % bolus 1,000 mL (1,000 mLs Intravenous New Bag/Given 09/06/22 0735)  ondansetron (ZOFRAN) injection 4 mg (4 mg Intravenous Given 09/06/22 0735)     IMPRESSION / MDM / ASSESSMENT AND PLAN / ED COURSE  I reviewed the triage vital signs and the nursing notes.  Patient's presentation is most consistent with acute presentation with potential threat to life or bodily function.  Patient presents to the emergency department for nausea vomiting diarrhea overnight.  Overall the patient appears well, mild diffuse tenderness with no focal or significant tenderness identified.  No rebound or guarding.  Vital signs are reassuring.  We will dose IV fluids and Zofran.  We will check labs and continue to closely monitor.  Patient agreeable to plan of care.  Differential would include gastroenteritis, gastritis, esophagitis, less likely SBO or other intra-abdominal pathology/infection.  Patient's workup shows a slight leukocytosis otherwise reassuring CBC, reassuring chemistry, normal lipase, normal urinalysis.  Vital signs are reassuring.  Patient states she is feeling much better after IV fluids and Zofran.  Has had no further episodes of vomiting.  Highly suspect gastroenteritis.  Will discharge with Zofran and have the patient  follow-up with her doctor.  Patient agreeable to plan of care.  Provided my typical return precautions.  FINAL CLINICAL IMPRESSION(S) / ED DIAGNOSES   Nausea vomiting diarrhea   Note:  This document was prepared using Dragon voice recognition software and may include unintentional dictation errors.   Minna Antis, MD 09/06/22 1056

## 2022-09-06 NOTE — Discharge Instructions (Signed)
Please take your Zofran as needed for nausea.  As we discussed please drink plenty of fluids over the next 1 to 2 days.  Please obtain plenty of rest.  Return to the emergency department for any fever any abdominal pain or any other symptom concerning to yourself.

## 2022-09-06 NOTE — ED Notes (Signed)
Dr. Lenard Lance at bedside for reevaluation

## 2022-09-06 NOTE — ED Notes (Signed)
Pt ambulatory to the restroom with standby assistance.  °

## 2022-09-06 NOTE — ED Triage Notes (Signed)
Pt via [facility] from home. Pt c/o NVD for the last couple of hours and reports generalized weakness since then. Pt has a hx of cholecystectomy and appendectomy. Denies any sick contacts. Pt is A&Ox4 and NAD.

## 2022-09-06 NOTE — ED Notes (Signed)
Pt states that the abd cramping and nausea has gotten better after fluids and medications. Explained we still needed to obtain a urine sample, pt verbalized understanding and call bell was within reach.

## 2022-09-07 ENCOUNTER — Encounter: Payer: Self-pay | Admitting: Internal Medicine

## 2022-09-08 NOTE — Telephone Encounter (Signed)
LMTCB

## 2022-09-08 NOTE — Telephone Encounter (Signed)
FYI- Patient returned my call. Symptoms have resolved as of last night. No vomiting, no diarrhea, no weakness. Feeling better today. Able to eat and drink. She thinks she may have gotten a stomach bug and it just took a couple of days to pass. She has seen Dr Rhea Belton in the past. Interested in seeing him again. I have scheduled her for an appointment with you to discuss. Pt agreed to be evaluated sooner if symptoms return to confirm nothing acute.

## 2022-09-12 ENCOUNTER — Telehealth: Payer: Self-pay

## 2022-09-12 NOTE — Telephone Encounter (Signed)
Transition Care Management Follow-up Telephone Call Date of discharge and from where: [facility] 6/11 How have you been since you were released from the hospital? Much better Any questions or concerns? No  Items Reviewed: Did the pt receive and understand the discharge instructions provided? Yes  Medications obtained and verified? Yes  Other? No  Any new allergies since your discharge? No  Dietary orders reviewed? No Do you have support at home? Yes    Follow up appointments reviewed:  PCP Hospital f/u appt confirmed? Yes  Scheduled to see PCP on 6/18 @ . Specialist Hospital f/u appt confirmed? No  Scheduled to see  on  @ . Are transportation arrangements needed? No  If their condition worsens, is the pt aware to call PCP or go to the Emergency Dept.? Yes Was the patient provided with contact information for the PCP's office or ED? Yes Was to pt encouraged to call back with questions or concerns? Yes

## 2022-09-19 DIAGNOSIS — M7061 Trochanteric bursitis, right hip: Secondary | ICD-10-CM | POA: Diagnosis not present

## 2022-09-19 DIAGNOSIS — Q6589 Other specified congenital deformities of hip: Secondary | ICD-10-CM | POA: Diagnosis not present

## 2022-09-20 ENCOUNTER — Ambulatory Visit: Payer: Medicare PPO | Admitting: Internal Medicine

## 2022-09-20 VITALS — BP 120/70 | HR 80 | Temp 97.9°F | Resp 16 | Ht 60.0 in | Wt 135.0 lb

## 2022-09-20 DIAGNOSIS — F439 Reaction to severe stress, unspecified: Secondary | ICD-10-CM | POA: Diagnosis not present

## 2022-09-20 DIAGNOSIS — R112 Nausea with vomiting, unspecified: Secondary | ICD-10-CM

## 2022-09-20 DIAGNOSIS — E7800 Pure hypercholesterolemia, unspecified: Secondary | ICD-10-CM | POA: Diagnosis not present

## 2022-09-20 DIAGNOSIS — Z86010 Personal history of colonic polyps: Secondary | ICD-10-CM | POA: Diagnosis not present

## 2022-09-20 DIAGNOSIS — K219 Gastro-esophageal reflux disease without esophagitis: Secondary | ICD-10-CM

## 2022-09-20 DIAGNOSIS — R739 Hyperglycemia, unspecified: Secondary | ICD-10-CM

## 2022-09-20 NOTE — Assessment & Plan Note (Signed)
Colonoscopy 12/13/19 - diverticulosis and internal hemorrhoids.  

## 2022-09-20 NOTE — Progress Notes (Signed)
Subjective:    Patient ID: Jessica Peck, female    DOB: 1950-08-14, 72 y.o.   MRN: [id number]  Patient here for  Chief Complaint  Patient presents with   Medical Management of Chronic Issues    HPI Here for ER follow up.  Was seen ER 09/06/22 - nausea and vomiting and diarrhea. Received IVFs and zofran. Labs revealed a slight leukocytosis. Diagnosed with gastroenteritis. Feeling better.  Still not back to normal.  To review, was seen for hip pain.  S/p injection and was placed on medrol dosepak.  Traveled [place].  6/11 - increased emesis as outlined.  Waves of vomiting prior to ER evaluation. States felt different from "just a normal virus".  (Did have vomiting and diarrhea).  Symptoms - 10 days.  No further vomiting.  Continued intermittent episodes of diarrhea.  Soft stool now. Eating bland food.  Feeling better.  Still not back to normal. No acid reflux symptoms.  No dysphagia.  Continues - protonix.  Request GI referral - wants to see Dr Rhea Belton.    Past Medical History:  Diagnosis Date   Chronic constipation    Esophagitis    Family history of breast cancer    Family history of colon cancer    Gastritis    EGD (hiatal hernia)   GERD (gastroesophageal reflux disease)    Hypercholesterolemia    Sleep apnea    CPAP   Vitamin D deficiency    Past Surgical History:  Procedure Laterality Date   APPENDECTOMY  1976   Family History  Problem Relation Age of Onset   Colon cancer Mother 49   COPD Mother    Heart disease Mother    Colon cancer Father        dx 48s   Breast cancer Sister 26       died age 24   Breast cancer Maternal Uncle        dx 53s   Breast cancer Paternal Aunt    Breast cancer Paternal Aunt    Social History   Socioeconomic History   Marital status: Married    Spouse name: Not on file   Number of children: 2   Years of education: Not on file   Highest education level: Not on file  Occupational History   Not on file  Tobacco Use   Smoking  status: Former    Types: Cigarettes   Smokeless tobacco: Never   Tobacco comments:    Smoked socially in college  Vaping Use   Vaping Use: Never used  Substance and Sexual Activity   Alcohol use: Yes    Alcohol/week: 0.0 standard drinks of alcohol    Comment: occasional   Drug use: No   Sexual activity: Not on file  Other Topics Concern   Not on file  Social History Narrative   Not on file   Social Determinants of Health   Financial Resource Strain: Low Risk  (08/06/2021)   Overall Financial Resource Strain (CARDIA)    Difficulty of Paying Living Expenses: Not hard at all  Food Insecurity: No Food Insecurity (08/06/2021)   Hunger Vital Sign    Worried About Running Out of Food in the Last Year: Never true    Ran Out of Food in the Last Year: Never true  Transportation Needs: No Transportation Needs (08/06/2021)   PRAPARE - Administrator, Civil Service (Medical): No    Lack of Transportation (Non-Medical): No  Physical Activity: Unknown (  07/01/2020)   Exercise Vital Sign    Days of Exercise per Week: 0 days    Minutes of Exercise per Session: Not on file  Stress: No Stress Concern Present (08/06/2021)   [employer] of Occupational Health - Occupational Stress Questionnaire    Feeling of Stress : Not at all  Social Connections: Unknown (08/06/2021)   Social Connection and Isolation Panel [NHANES]    Frequency of Communication with Friends and Family: Not on file    Frequency of Social Gatherings with Friends and Family: Not on file    Attends Religious Services: Not on file    Active Member of Clubs or Organizations: Not on file    Attends Banker Meetings: Not on file    Marital Status: Married     Review of Systems  Constitutional:  Positive for appetite change. Negative for fever.  HENT:  Negative for congestion and sinus pressure.   Respiratory:  Negative for cough, chest tightness and shortness of breath.   Cardiovascular:  Negative for  chest pain, palpitations and leg swelling.  Gastrointestinal:        Previous vomiting and diarrhea.  Vomiting resolved.  Soft stool now. No increased abdominal pain.   Genitourinary:  Negative for difficulty urinating and dysuria.  Musculoskeletal:  Negative for joint swelling and myalgias.  Skin:  Negative for color change and rash.  Neurological:  Negative for dizziness and headaches.  Psychiatric/Behavioral:  Negative for agitation and dysphoric mood.        Objective:     BP 120/70   Pulse 80   Temp 97.9 F (36.6 C)   Resp 16   Ht 5' (1.524 m)   Wt 135 lb (61.2 kg)   LMP 06/04/2000   SpO2 98%   BMI 26.37 kg/m  Wt Readings from Last 3 Encounters:  09/20/22 135 lb (61.2 kg)  09/06/22 138 lb (62.6 kg)  07/26/22 139 lb 3.2 oz (63.1 kg)    Physical Exam Vitals reviewed.  Constitutional:      General: She is not in acute distress.    Appearance: Normal appearance.  HENT:     Head: Normocephalic and atraumatic.     Right Ear: External ear normal.     Left Ear: External ear normal.  Eyes:     General: No scleral icterus.       Right eye: No discharge.        Left eye: No discharge.     Conjunctiva/sclera: Conjunctivae normal.  Neck:     Thyroid: No thyromegaly.  Cardiovascular:     Rate and Rhythm: Normal rate and regular rhythm.  Pulmonary:     Effort: No respiratory distress.     Breath sounds: Normal breath sounds. No wheezing.  Abdominal:     General: Bowel sounds are normal.     Palpations: Abdomen is soft.     Tenderness: There is no abdominal tenderness.  Musculoskeletal:        General: No swelling or tenderness.     Cervical back: Neck supple. No tenderness.  Lymphadenopathy:     Cervical: No cervical adenopathy.  Skin:    Findings: No erythema or rash.  Neurological:     Mental Status: She is alert.  Psychiatric:        Mood and Affect: Mood normal.        Behavior: Behavior normal.      Outpatient Encounter Medications as of 09/20/2022   Medication Sig   CELEBREX 200  MG capsule Take 200 mg by mouth daily.   Cyanocobalamin 1000 MCG TBCR Take by mouth.   melatonin 5 MG TABS Take 5 mg by mouth at bedtime.   ondansetron (ZOFRAN-ODT) 4 MG disintegrating tablet Take 1 tablet (4 mg total) by mouth every 8 (eight) hours as needed for nausea or vomiting.   pantoprazole (PROTONIX) 20 MG tablet TAKE 1 TO 2 TABS BY MOUTH EVERY DAY   sertraline (ZOLOFT) 100 MG tablet Take 1 tablet (100 mg total) by mouth daily.   VITAMIN D, CHOLECALCIFEROL, PO Take 1,000 Units by mouth daily.   [DISCONTINUED] meloxicam (MOBIC) 7.5 MG tablet Take 7.5 mg by mouth daily as needed.    No facility-administered encounter medications on file as of 09/20/2022.     Lab Results  Component Value Date   WBC 5.0 09/21/2022   HGB 12.3 09/21/2022   HCT 37.1 09/21/2022   PLT 319.0 09/21/2022   GLUCOSE 89 09/21/2022   CHOL 218 (H) 09/21/2022   TRIG 207.0 (H) 09/21/2022   HDL 49.30 09/21/2022   LDLDIRECT 140.0 09/21/2022   LDLCALC 141 (H) 04/19/2022   ALT 19 09/21/2022   AST 17 09/21/2022   NA 138 09/21/2022   K 4.3 09/21/2022   CL 105 09/21/2022   CREATININE 0.78 09/21/2022   BUN 19 09/21/2022   CO2 25 09/21/2022   TSH 4.14 09/21/2022   HGBA1C 6.0 09/21/2022    No results found.     Assessment & Plan:  History of colonic polyps Assessment & Plan: Colonoscopy 12/13/19 - diverticulosis and internal hemorrhoids.    Gastroesophageal reflux disease, unspecified whether esophagitis present Assessment & Plan: Continues on protonix.    Orders: -     Ambulatory referral to Gastroenterology  Nausea and vomiting, unspecified vomiting type Assessment & Plan: Increased vomiting episode (associated with diarrhea) as outlined.  Resolved.  Discussed likely etiologies - infection, etc. Doing better now.  Acid reflux controlled.  Previous EGD 2013 - gastritis, hiatal hernia.  Continue bland foods.  Advance as tolerated.  Benefiber to help with stool.  Request  referral to GI.     Stress Assessment & Plan: On zoloft now.  Doing better.  Follow.  Continue current dose.  No changes.    Pure hypercholesterolemia Assessment & Plan: The 10-year ASCVD risk score (Arnett DK, et al., 2019) is: 8.8%   Values used to calculate the score:     Age: 25 years     Sex: Female     Is Non-Hispanic African American: No     Diabetic: No     Tobacco smoker: No     Systolic Blood Pressure: 120 mmHg     Is BP treated: No     HDL Cholesterol: 49.3 mg/dL     Total Cholesterol: 218 mg/dL  Low cholesterol diet and exercise. Follow lipid panel.    Hyperglycemia Assessment & Plan: Low carb diet and exercise.  Follow met b and a1c.       Dale Cloverdale, MD

## 2022-09-21 ENCOUNTER — Other Ambulatory Visit (INDEPENDENT_AMBULATORY_CARE_PROVIDER_SITE_OTHER): Payer: Medicare PPO

## 2022-09-21 DIAGNOSIS — E7800 Pure hypercholesterolemia, unspecified: Secondary | ICD-10-CM | POA: Diagnosis not present

## 2022-09-21 DIAGNOSIS — R739 Hyperglycemia, unspecified: Secondary | ICD-10-CM | POA: Diagnosis not present

## 2022-09-21 LAB — CBC WITH DIFFERENTIAL/PLATELET
Basophils Absolute: 0.1 10*3/uL (ref 0.0–0.1)
Basophils Relative: 1.1 % (ref 0.0–3.0)
Eosinophils Absolute: 0.2 10*3/uL (ref 0.0–0.7)
Eosinophils Relative: 3.6 % (ref 0.0–5.0)
HCT: 37.1 % (ref 36.0–46.0)
Hemoglobin: 12.3 g/dL (ref 12.0–15.0)
Lymphocytes Relative: 56.2 % — ABNORMAL HIGH (ref 12.0–46.0)
Lymphs Abs: 2.8 10*3/uL (ref 0.7–4.0)
MCHC: 33.2 g/dL (ref 30.0–36.0)
MCV: 90.7 fl (ref 78.0–100.0)
Monocytes Absolute: 0.4 10*3/uL (ref 0.1–1.0)
Monocytes Relative: 8.1 % (ref 3.0–12.0)
Neutro Abs: 1.6 10*3/uL (ref 1.4–7.7)
Neutrophils Relative %: 31 % — ABNORMAL LOW (ref 43.0–77.0)
Platelets: 319 10*3/uL (ref 150.0–400.0)
RBC: 4.1 Mil/uL (ref 3.87–5.11)
RDW: 14 % (ref 11.5–15.5)
WBC: 5 10*3/uL (ref 4.0–10.5)

## 2022-09-21 LAB — BASIC METABOLIC PANEL
BUN: 19 mg/dL (ref 6–23)
CO2: 25 mEq/L (ref 19–32)
Calcium: 9.8 mg/dL (ref 8.4–10.5)
Chloride: 105 mEq/L (ref 96–112)
Creatinine, Ser: 0.78 mg/dL (ref 0.40–1.20)
GFR: 76.84 mL/min (ref >60.00)
Glucose, Bld: 89 mg/dL (ref 70–99)
Potassium: 4.3 mEq/L (ref 3.5–5.1)
Sodium: 138 mEq/L (ref 135–145)

## 2022-09-21 LAB — LIPID PANEL
Cholesterol: 218 mg/dL — ABNORMAL HIGH (ref 0–200)
HDL: 49.3 mg/dL (ref >39.00)
NonHDL: 168.31
Total CHOL/HDL Ratio: 4
Triglycerides: 207 mg/dL — ABNORMAL HIGH (ref 0.0–149.0)
VLDL: 41.4 mg/dL — ABNORMAL HIGH (ref 0.0–40.0)

## 2022-09-21 LAB — HEPATIC FUNCTION PANEL
ALT: 19 U/L (ref 0–35)
AST: 17 U/L (ref 0–37)
Albumin: 4.1 g/dL (ref 3.5–5.2)
Alkaline Phosphatase: 56 U/L (ref 39–117)
Bilirubin, Direct: 0.1 mg/dL (ref 0.0–0.3)
Total Bilirubin: 0.3 mg/dL (ref 0.2–1.2)
Total Protein: 6.8 g/dL (ref 6.0–8.3)

## 2022-09-21 LAB — TSH: TSH: 4.14 u[IU]/mL (ref 0.35–5.50)

## 2022-09-21 LAB — HEMOGLOBIN A1C: Hgb A1c MFr Bld: 6 % (ref 4.6–6.5)

## 2022-09-21 LAB — LDL CHOLESTEROL, DIRECT: Direct LDL: 140 mg/dL

## 2022-09-25 ENCOUNTER — Encounter: Payer: Self-pay | Admitting: Internal Medicine

## 2022-09-25 DIAGNOSIS — R1110 Vomiting, unspecified: Secondary | ICD-10-CM | POA: Insufficient documentation

## 2022-09-25 NOTE — Assessment & Plan Note (Signed)
Continues on protonix.  ?

## 2022-09-25 NOTE — Assessment & Plan Note (Signed)
The 10-year ASCVD risk score (Arnett DK, et al., 2019) is: 8.8%   Values used to calculate the score:     Age: 72 years     Sex: Female     Is Non-Hispanic African American: No     Diabetic: No     Tobacco smoker: No     Systolic Blood Pressure: 120 mmHg     Is BP treated: No     HDL Cholesterol: 49.3 mg/dL     Total Cholesterol: 218 mg/dL  Low cholesterol diet and exercise. Follow lipid panel.

## 2022-09-25 NOTE — Assessment & Plan Note (Signed)
On zoloft now.  Doing better.  Follow.  Continue current dose.  No changes.  

## 2022-09-25 NOTE — Assessment & Plan Note (Addendum)
Increased vomiting episode (associated with diarrhea) as outlined.  Resolved.  Discussed likely etiologies - infection, etc. Doing better now.  Acid reflux controlled.  Previous EGD 2013 - gastritis, hiatal hernia.  Continue bland foods.  Advance as tolerated.  Benefiber to help with stool.  Request referral to GI.

## 2022-09-25 NOTE — Assessment & Plan Note (Signed)
Low carb diet and exercise.  Follow met b and a1c.   

## 2022-09-26 ENCOUNTER — Other Ambulatory Visit: Payer: Self-pay | Admitting: Internal Medicine

## 2022-09-26 MED ORDER — PRAVASTATIN SODIUM 10 MG PO TABS
10.0000 mg | ORAL_TABLET | Freq: Every day | ORAL | 2 refills | Status: DC
Start: 2022-09-26 — End: 2022-12-13

## 2022-09-26 NOTE — Progress Notes (Signed)
Order placed for pravastatin.

## 2022-09-27 ENCOUNTER — Telehealth: Payer: Self-pay | Admitting: Internal Medicine

## 2022-09-27 NOTE — Telephone Encounter (Signed)
ok 

## 2022-09-27 NOTE — Telephone Encounter (Signed)
Good Afternoon Dr Rhea Belton   We have received a request from patient requesting to transfer care over to you due to you being the provider of her husband. Patient also states that her previous provider will be retiring as well.   Patient has previous history with [facility] clinic gastro. Records are available in epic. Please review and advise on scheduling.   Thank you

## 2022-10-04 ENCOUNTER — Encounter: Payer: Self-pay | Admitting: Internal Medicine

## 2022-10-05 ENCOUNTER — Ambulatory Visit: Payer: Medicare PPO | Admitting: Internal Medicine

## 2022-10-08 ENCOUNTER — Other Ambulatory Visit: Payer: Self-pay | Admitting: Internal Medicine

## 2022-10-21 DIAGNOSIS — M1711 Unilateral primary osteoarthritis, right knee: Secondary | ICD-10-CM | POA: Diagnosis not present

## 2022-10-21 DIAGNOSIS — M25559 Pain in unspecified hip: Secondary | ICD-10-CM | POA: Diagnosis not present

## 2022-10-21 DIAGNOSIS — M7061 Trochanteric bursitis, right hip: Secondary | ICD-10-CM | POA: Diagnosis not present

## 2022-10-21 DIAGNOSIS — M1631 Unilateral osteoarthritis resulting from hip dysplasia, right hip: Secondary | ICD-10-CM | POA: Diagnosis not present

## 2022-10-31 ENCOUNTER — Encounter: Payer: Self-pay | Admitting: Internal Medicine

## 2022-10-31 DIAGNOSIS — E7800 Pure hypercholesterolemia, unspecified: Secondary | ICD-10-CM

## 2022-10-31 NOTE — Telephone Encounter (Signed)
LMTCB. Needs non fasting lab appointment in the next 2-3 weeks. Lab ordered.

## 2022-10-31 NOTE — Telephone Encounter (Signed)
Pt called back and is scheduled for a non fasting lab on 8/27

## 2022-10-31 NOTE — Telephone Encounter (Signed)
Noted  

## 2022-11-01 NOTE — Telephone Encounter (Signed)
Patient is out of town all week. Advised that we would have to have a urine in order to treat. She is going to go to the local urgent care to be evaluated.

## 2022-11-10 ENCOUNTER — Encounter (INDEPENDENT_AMBULATORY_CARE_PROVIDER_SITE_OTHER): Payer: Self-pay

## 2022-11-22 ENCOUNTER — Other Ambulatory Visit (INDEPENDENT_AMBULATORY_CARE_PROVIDER_SITE_OTHER): Payer: Medicare PPO

## 2022-11-22 DIAGNOSIS — E7800 Pure hypercholesterolemia, unspecified: Secondary | ICD-10-CM

## 2022-11-22 LAB — HEPATIC FUNCTION PANEL
ALT: 16 U/L (ref 0–35)
AST: 16 U/L (ref 0–37)
Albumin: 4.3 g/dL (ref 3.5–5.2)
Alkaline Phosphatase: 52 U/L (ref 39–117)
Bilirubin, Direct: 0.1 mg/dL (ref 0.0–0.3)
Total Bilirubin: 0.4 mg/dL (ref 0.2–1.2)
Total Protein: 7 g/dL (ref 6.0–8.3)

## 2022-11-24 ENCOUNTER — Ambulatory Visit (INDEPENDENT_AMBULATORY_CARE_PROVIDER_SITE_OTHER): Payer: Medicare PPO | Admitting: Emergency Medicine

## 2022-11-24 VITALS — Ht 60.0 in | Wt 136.0 lb

## 2022-11-24 DIAGNOSIS — Z780 Asymptomatic menopausal state: Secondary | ICD-10-CM | POA: Diagnosis not present

## 2022-11-24 DIAGNOSIS — Z1231 Encounter for screening mammogram for malignant neoplasm of breast: Secondary | ICD-10-CM | POA: Diagnosis not present

## 2022-11-24 DIAGNOSIS — Z0000 Encounter for general adult medical examination without abnormal findings: Secondary | ICD-10-CM

## 2022-11-24 NOTE — Progress Notes (Signed)
Subjective:   Jessica Peck is a 72 y.o. female who presents for Medicare Annual (Subsequent) preventive examination.  Visit Complete: Virtual  I connected with  Glennie Hawk on 11/24/22 by a audio enabled telemedicine application and verified that I am speaking with the correct person using two identifiers.  Patient Location: Home  Provider Location: Home Office  I discussed the limitations of evaluation and management by telemedicine. The patient expressed understanding and agreed to proceed.  Vital Signs: Because this visit was a virtual/telehealth visit, some criteria may be missing or patient reported. Any vitals not documented were not able to be obtained and vitals that have been documented are patient reported.    Review of Systems     Cardiac Risk Factors include: advanced age (>29men, >79 women);dyslipidemia;Other (see comment), Risk factor comments: OSA (cpap)     Objective:    Today's Vitals   11/24/22 1417  Weight: 136 lb (61.7 kg)  Height: 5' (1.524 m)  PainSc: 7    Body mass index is 26.56 kg/m.     11/24/2022    2:32 PM 09/06/2022    7:24 AM 08/06/2021    3:01 PM 07/01/2020   12:44 PM 07/01/2019   12:55 PM  Advanced Directives  Does Patient Have a Medical Advance Directive? Yes Yes Yes No Yes  Type of Estate agent of [place];Living will Healthcare Power of [place];Living will Healthcare Power of [place];Living will  Healthcare Power of [place];Living will  Does patient want to make changes to medical advance directive? No - Patient declined  No - Patient declined  No - Patient declined  Copy of Healthcare Power of Attorney in Chart? No - copy requested  Yes - validated most recent copy scanned in chart (See row information)  No - copy requested  Would patient like information on creating a medical advance directive?    No - Patient declined     Current Medications (verified) Outpatient Encounter Medications as of 11/24/2022   Medication Sig   CELEBREX 200 MG capsule Take 200 mg by mouth daily.   Cyanocobalamin 1000 MCG TBCR Take by mouth.   melatonin 5 MG TABS Take 5 mg by mouth at bedtime.   pantoprazole (PROTONIX) 20 MG tablet TAKE 1 TO 2 TABS BY MOUTH EVERY DAY   pravastatin (PRAVACHOL) 10 MG tablet Take 1 tablet (10 mg total) by mouth daily.   sertraline (ZOLOFT) 100 MG tablet Take 1 tablet (100 mg total) by mouth daily.   traMADol (ULTRAM) 50 MG tablet Take 50 mg by mouth every 6 (six) hours as needed.   VITAMIN D, CHOLECALCIFEROL, PO Take 1,000 Units by mouth daily.   ondansetron (ZOFRAN-ODT) 4 MG disintegrating tablet Take 1 tablet (4 mg total) by mouth every 8 (eight) hours as needed for nausea or vomiting. (Patient not taking: Reported on 11/24/2022)   No facility-administered encounter medications on file as of 11/24/2022.    Allergies (verified) Penicillins   History: Past Medical History:  Diagnosis Date   Chronic constipation    Esophagitis    Family history of breast cancer    Family history of colon cancer    Gastritis    EGD (hiatal hernia)   GERD (gastroesophageal reflux disease)    Hypercholesterolemia    Sleep apnea    CPAP   Vitamin D deficiency    Past Surgical History:  Procedure Laterality Date   APPENDECTOMY  1976   Family History  Problem Relation Age of Onset  Colon cancer Mother 44   COPD Mother    Heart disease Mother    Colon cancer Father        dx 12s   Breast cancer Sister 74       died age 38   Breast cancer Maternal Uncle        dx 24s   Breast cancer Paternal Aunt    Breast cancer Paternal Aunt    Social History   Socioeconomic History   Marital status: Married    Spouse name: Onalee Hua   Number of children: 2   Years of education: Not on file   Highest education level: Not on file  Occupational History   Occupation: retired Research scientist (medical)    Comment: substitue teaches occassionally  Tobacco Use   Smoking status: Former    Current packs/day:  0.00    Average packs/day: 0.1 packs/day for 1 year (0.1 ttl pk-yrs)    Types: Cigarettes    Start date: 37    Quit date: 1974    Years since quitting: 50.6   Smokeless tobacco: Never   Tobacco comments:    Smoked socially in college  Vaping Use   Vaping status: Never Used  Substance and Sexual Activity   Alcohol use: Yes    Alcohol/week: 3.0 standard drinks of alcohol    Types: 3 Glasses of wine per week    Comment: 1 glass of wine 3 times per week   Drug use: No   Sexual activity: Not on file  Other Topics Concern   Not on file  Social History Narrative   Not on file   Social Determinants of Health   Financial Resource Strain: Low Risk  (11/24/2022)   Overall Financial Resource Strain (CARDIA)    Difficulty of Paying Living Expenses: Not hard at all  Food Insecurity: No Food Insecurity (11/24/2022)   Hunger Vital Sign    Worried About Running Out of Food in the Last Year: Never true    Ran Out of Food in the Last Year: Never true  Transportation Needs: No Transportation Needs (11/24/2022)   PRAPARE - Administrator, Civil Service (Medical): No    Lack of Transportation (Non-Medical): No  Physical Activity: Inactive (11/24/2022)   Exercise Vital Sign    Days of Exercise per Week: 0 days    Minutes of Exercise per Session: 0 min  Stress: No Stress Concern Present (11/24/2022)   [employer] of Occupational Health - Occupational Stress Questionnaire    Feeling of Stress : Not at all  Social Connections: Socially Integrated (11/24/2022)   Social Connection and Isolation Panel [NHANES]    Frequency of Communication with Friends and Family: More than three times a week    Frequency of Social Gatherings with Friends and Family: Once a week    Attends Religious Services: More than 4 times per year    Active Member of [employer] or Organizations: Yes    Attends Engineer, structural: More than 4 times per year    Marital Status: Married    Tobacco  Counseling Counseling given: Not Answered Tobacco comments: Smoked socially in college   Clinical Intake:  Pre-visit preparation completed: Yes  Pain : 0-10 Pain Score: 7  Pain Type: Chronic pain Pain Location: Hip Pain Orientation: Right Pain Descriptors / Indicators: Aching     BMI - recorded: 26.56 Nutritional Status: BMI 25 -29 Overweight Nutritional Risks: None Diabetes: No  How often do you need to have someone  help you when you read instructions, pamphlets, or other written materials from your doctor or pharmacy?: 1 - Never  Interpreter Needed?: No  Information entered by :: Tora Kindred, CMA   Activities of Daily Living    11/24/2022    2:20 PM  In your present state of health, do you have any difficulty performing the following activities:  Hearing? 0  Vision? 0  Difficulty concentrating or making decisions? 0  Walking or climbing stairs? 1  Comment hip pain, having hip replacement  Dressing or bathing? 0  Doing errands, shopping? 0  Preparing Food and eating ? N  Using the Toilet? N  In the past six months, have you accidently leaked urine? N  Do you have problems with loss of bowel control? N  Managing your Medications? N  Managing your Finances? N  Housekeeping or managing your Housekeeping? N    Patient Care Team: Dale Hessmer, MD as PCP - General (Internal Medicine) Debbe Odea, MD as PCP - Cardiology (Cardiology) Pyrtle, Carie Caddy, MD as Consulting Physician (Gastroenterology)  Indicate any recent Medical Services you may have received from other than [facility] providers in the past year (date may be approximate).     Assessment:   This is a routine wellness examination for Kaiyah.  Hearing/Vision screen Hearing Screening - Comments:: Denies hearing loss Vision Screening - Comments:: Gets routine eye exams  Dietary issues and exercise activities discussed:     Goals Addressed               This Visit's Progress     Patient  Stated (pt-stated)        Get hip replacement surgery and be able to be more active      Depression Screen    11/24/2022    2:31 PM 06/29/2022    8:35 AM 04/21/2022   10:28 AM 08/06/2021    3:00 PM 07/01/2020   12:42 PM 07/01/2019   12:56 PM 12/17/2018   11:46 AM  PHQ 2/9 Scores  PHQ - 2 Score 0 6 3 0 0 0 0  PHQ- 9 Score 0 19 10        Fall Risk    11/24/2022    2:33 PM 04/21/2022   10:28 AM 08/06/2021    3:02 PM 07/01/2020   12:45 PM 07/01/2019   12:56 PM  Fall Risk   Falls in the past year? 0 0 0 0 0  Number falls in past yr: 0 0 0 0   Injury with Fall? 0 0  0   Risk for fall due to : No Fall Risks No Fall Risks No Fall Risks    Follow up Falls prevention discussed Falls evaluation completed Falls evaluation completed Falls evaluation completed Falls evaluation completed;Falls prevention discussed    MEDICARE RISK AT HOME: Medicare Risk at Home Any stairs in or around the home?: Yes If so, are there any without handrails?: No Home free of loose throw rugs in walkways, pet beds, electrical cords, etc?: Yes Adequate lighting in your home to reduce risk of falls?: Yes Life alert?: No Use of a cane, walker or w/c?: No Grab bars in the bathroom?: No Shower chair or bench in shower?: No Elevated toilet seat or a handicapped toilet?: No  TIMED UP AND GO:  Was the test performed?  No    Cognitive Function:        11/24/2022    2:34 PM 07/01/2019    1:04 PM  6CIT Screen  What Year? 0 points 0 points  What month? 0 points 0 points  What time? 0 points 0 points  Count back from 20 0 points   Months in reverse 0 points   Repeat phrase 0 points   Total Score 0 points     Immunizations Immunization History  Administered Date(s) Administered   Fluad Quad(high Dose 65+) 01/14/2022   Hepatitis A 09/26/2011   Hepatitis B 09/26/2011   Influenza Split 01/02/2013, 01/14/2014, 01/20/2016   Influenza, High Dose Seasonal PF 01/16/2020, 12/31/2020   Influenza,inj,Quad PF,6+ Mos  12/26/2016   Influenza-Unspecified 01/27/2018   Moderna Covid-19 Vaccine Bivalent Booster 42yrs & up 12/29/2021   PFIZER Comirnaty(Gray Top)Covid-19 Tri-Sucrose Vaccine 07/28/2020   PFIZER(Purple Top)SARS-COV-2 Vaccination 04/01/2019, 04/22/2019, 02/01/2020   Pneumococcal Conjugate-13 04/10/2018   Pneumococcal Polysaccharide-23 08/27/2019   Td 09/11/2011   Unspecified SARS-COV-2 Vaccination 12/29/2021   Zoster Recombinant(Shingrix) 10/18/2018, 01/01/2019   Zoster, Live 01/20/2016    TDAP status: Due, Education has been provided regarding the importance of this vaccine. Advised may receive this vaccine at local pharmacy or Health Dept. Aware to provide a copy of the vaccination record if obtained from local pharmacy or Health Dept. Verbalized acceptance and understanding.  Flu Vaccine status: Due, Education has been provided regarding the importance of this vaccine. Advised may receive this vaccine at local pharmacy or Health Dept. Aware to provide a copy of the vaccination record if obtained from local pharmacy or Health Dept. Verbalized acceptance and understanding.  Pneumococcal vaccine status: Up to date  Covid-19 vaccine status: Information provided on how to obtain vaccines.   Qualifies for Shingles Vaccine? Yes   Zostavax completed No   Shingrix Completed?: Yes  Screening Tests Health Maintenance  Topic Date Due   DEXA SCAN  03/10/2016   DTaP/Tdap/Td (2 - Tdap) 09/10/2021   COVID-19 Vaccine (6 - 2023-24 season) 02/23/2022   INFLUENZA VACCINE  10/27/2022   Medicare Annual Wellness (AWV)  11/24/2023   MAMMOGRAM  02/26/2024   Colonoscopy  12/12/2024   Pneumonia Vaccine 68+ Years old  Completed   Hepatitis C Screening  Completed   Zoster Vaccines- Shingrix  Completed   HPV VACCINES  Aged Out    Health Maintenance  Health Maintenance Due  Topic Date Due   DEXA SCAN  03/10/2016   DTaP/Tdap/Td (2 - Tdap) 09/10/2021   COVID-19 Vaccine (6 - 2023-24 season) 02/23/2022    INFLUENZA VACCINE  10/27/2022    Colorectal cancer screening: Type of screening: Colonoscopy. Completed 12/13/19. Repeat every 5 years  Mammogram status: Completed 02/25/22. Repeat every year  Bone Density status: Completed 02/28/14. Results reflect: Bone density results: OSTEOPENIA. Repeat every 2 years.  Lung Cancer Screening: (Low Dose CT Chest recommended if Age 45-80 years, 20 pack-year currently smoking OR have quit w/in 15years.) does not qualify.   Lung Cancer Screening Referral: n/a  Additional Screening:  Hepatitis C Screening: does not qualify; Completed 01/27/20  Vision Screening: Recommended annual ophthalmology exams for early detection of glaucoma and other disorders of the eye. I Dental Screening: Recommended annual dental exams for proper oral hygiene    Community Resource Referral / Chronic Care Management: CRR required this visit?  No   CCM required this visit?  No     Plan:     I have personally reviewed and noted the following in the patient's chart:   Medical and social history Use of alcohol, tobacco or illicit drugs  Current medications and supplements including opioid prescriptions. Patient is currently taking opioid  prescriptions. Information provided to patient regarding non-opioid alternatives. Patient advised to discuss non-opioid treatment plan with their provider. Functional ability and status Nutritional status Physical activity Advanced directives List of other physicians Hospitalizations, surgeries, and ER visits in previous 12 months Vitals Screenings to include cognitive, depression, and falls Referrals and appointments  In addition, I have reviewed and discussed with patient certain preventive protocols, quality metrics, and best practice recommendations. A written personalized care plan for preventive services as well as general preventive health recommendations were provided to patient.     Tora Kindred, CMA   11/24/2022   After  Visit Summary: (MyChart) Due to this being a telephonic visit, the after visit summary with patients personalized plan was offered to patient via MyChart   Nurse Notes:  Placed orders for MMG and DEXA to be done 02/2023. Will get flu shot in the fall. Due for Tdap vaccine. Undecided if she will get a covid booster.

## 2022-11-24 NOTE — Patient Instructions (Addendum)
Ms. Hallen , Thank you for taking time to come for your Medicare Wellness Visit. I appreciate your ongoing commitment to your health goals. Please review the following plan we discussed and let me know if I can assist you in the future.   Referrals/Orders/Follow-Ups/Clinician Recommendations: Get the flu shot this fall. You are due for a tetanus shot. I have placed orders for a mammogram and bone density test to be done in December 2024. Call [facility] @ [phone] to schedule these. I recommend calling early so that you will be able to do both tests in 1 visit. Good luck with your upcoming hip replacement surgery!  This is a list of the screening recommended for you and due dates:  Health Maintenance  Topic Date Due   DEXA scan (bone density measurement)  03/10/2016   DTaP/Tdap/Td vaccine (2 - Tdap) 09/10/2021   COVID-19 Vaccine (6 - 2023-24 season) 02/23/2022   Flu Shot  10/27/2022   Mammogram  02/26/2023   Medicare Annual Wellness Visit  11/24/2023   Colon Cancer Screening  12/12/2024   Pneumonia Vaccine  Completed   Hepatitis C Screening  Completed   Zoster (Shingles) Vaccine  Completed   HPV Vaccine  Aged Out    Advanced directives: (Copy Requested) Please bring a copy of your health care power of attorney and living will to the office to be added to your chart at your convenience.  Next Medicare Annual Wellness Visit scheduled for next year: Yes, 11/30/23 @ 2:15pm

## 2022-11-30 DIAGNOSIS — M1631 Unilateral osteoarthritis resulting from hip dysplasia, right hip: Secondary | ICD-10-CM | POA: Diagnosis not present

## 2022-11-30 DIAGNOSIS — Z0189 Encounter for other specified special examinations: Secondary | ICD-10-CM | POA: Diagnosis not present

## 2022-12-01 ENCOUNTER — Other Ambulatory Visit: Payer: Self-pay | Admitting: Internal Medicine

## 2022-12-06 DIAGNOSIS — Z0189 Encounter for other specified special examinations: Secondary | ICD-10-CM | POA: Diagnosis not present

## 2022-12-06 DIAGNOSIS — Z01812 Encounter for preprocedural laboratory examination: Secondary | ICD-10-CM | POA: Diagnosis not present

## 2022-12-10 ENCOUNTER — Other Ambulatory Visit: Payer: Self-pay | Admitting: Internal Medicine

## 2022-12-19 DIAGNOSIS — M25551 Pain in right hip: Secondary | ICD-10-CM | POA: Diagnosis not present

## 2022-12-19 DIAGNOSIS — M1611 Unilateral primary osteoarthritis, right hip: Secondary | ICD-10-CM | POA: Diagnosis not present

## 2022-12-20 ENCOUNTER — Telehealth (INDEPENDENT_AMBULATORY_CARE_PROVIDER_SITE_OTHER): Payer: Medicare PPO | Admitting: Internal Medicine

## 2022-12-20 ENCOUNTER — Encounter: Payer: Self-pay | Admitting: Internal Medicine

## 2022-12-20 DIAGNOSIS — F439 Reaction to severe stress, unspecified: Secondary | ICD-10-CM | POA: Diagnosis not present

## 2022-12-20 DIAGNOSIS — F32A Depression, unspecified: Secondary | ICD-10-CM | POA: Diagnosis not present

## 2022-12-20 DIAGNOSIS — E7800 Pure hypercholesterolemia, unspecified: Secondary | ICD-10-CM | POA: Diagnosis not present

## 2022-12-20 DIAGNOSIS — Z86010 Personal history of colonic polyps: Secondary | ICD-10-CM

## 2022-12-20 DIAGNOSIS — G4733 Obstructive sleep apnea (adult) (pediatric): Secondary | ICD-10-CM | POA: Diagnosis not present

## 2022-12-20 DIAGNOSIS — M25551 Pain in right hip: Secondary | ICD-10-CM

## 2022-12-20 DIAGNOSIS — K219 Gastro-esophageal reflux disease without esophagitis: Secondary | ICD-10-CM | POA: Diagnosis not present

## 2022-12-20 DIAGNOSIS — R739 Hyperglycemia, unspecified: Secondary | ICD-10-CM

## 2022-12-20 NOTE — Progress Notes (Signed)
Patient ID: Jessica Peck, female   DOB: Aug 28, 1950, 72 y.o.   MRN: [id number]   Virtual Visit via video Note  I connected with Jessica Peck by a video enabled telemedicine application and verified that I am speaking with the correct person using two identifiers. Location patient: home Location provider: work  Persons participating in the virtual visit: patient, provider  The limitations, risks, security and privacy concerns of performing an evaluation and management service by video and the availability of in person appointments have been discussed.  It has also been discussed with the patient that there may be a patient responsible charge related to this service. The patient expressed understanding and agreed to proceed.    Reason for visit: follow up appt  HPI: Had a f/u appt scheduled. F/u regarding GERD, stress and hypercholesterolemia.  Planning for hip surgery tomorrow.  Using a cane. Her hip has been limiting her activity.  Some increased stress related to the hip surgery.  Overall handling things well.  No chest pain or sob reported.  No cough or congestion.  No abdominal pain or bowel change.     ROS: See pertinent positives and negatives per HPI.  Past Medical History:  Diagnosis Date   Chronic constipation    Esophagitis    Family history of breast cancer    Family history of colon cancer    Gastritis    EGD (hiatal hernia)   GERD (gastroesophageal reflux disease)    Hypercholesterolemia    Sleep apnea    CPAP   Vitamin D deficiency     Past Surgical History:  Procedure Laterality Date   APPENDECTOMY  1976    Family History  Problem Relation Age of Onset   Colon cancer Mother 24   COPD Mother    Heart disease Mother    Colon cancer Father        dx 55s   Breast cancer Sister 72       died age 23   Breast cancer Maternal Uncle        dx 65s   Breast cancer Paternal Aunt    Breast cancer Paternal Aunt     SOCIAL HX: reviewed.    Current  Outpatient Medications:    aspirin EC 81 MG tablet, 81 mg., Disp: , Rfl:    docusate sodium (COLACE) 100 MG capsule, Take 1 capsule twice a day by oral route., Disp: , Rfl:    melatonin 5 MG TABS, Take 5 mg by mouth at bedtime., Disp: , Rfl:    pantoprazole (PROTONIX) 20 MG tablet, TAKE 1 TO 2 TABS BY MOUTH EVERY DAY, Disp: 180 tablet, Rfl: 1   pravastatin (PRAVACHOL) 10 MG tablet, TAKE 1 TABLET BY MOUTH EVERY DAY, Disp: 90 tablet, Rfl: 3   sertraline (ZOLOFT) 100 MG tablet, Take 1 tablet (100 mg total) by mouth daily., Disp: 90 tablet, Rfl: 1   VITAMIN D, CHOLECALCIFEROL, PO, Take 1,000 Units by mouth daily., Disp: , Rfl:    methocarbamol (ROBAXIN) 500 MG tablet, Take 1 tablet 3 times a day by oral route., Disp: , Rfl:    oxyCODONE (OXY IR/ROXICODONE) 5 MG immediate release tablet, 1 tab PO Q4H PRN PAIN, Disp: , Rfl:   EXAM:  GENERAL: alert, oriented, appears well and in no acute distress  HEENT: atraumatic, conjunttiva clear, no obvious abnormalities on inspection of external nose and ears  NECK: normal movements of the head and neck  LUNGS: on inspection no signs of respiratory distress, breathing  rate appears normal, no obvious gross SOB, gasping or wheezing  CV: no obvious cyanosis  PSYCH/NEURO: pleasant and cooperative, no obvious depression or anxiety, speech and thought processing grossly intact  ASSESSMENT AND PLAN:  Discussed the following assessment and plan:  Problem List Items Addressed This Visit     Stress - Primary    On zoloft now. Overall appears to be handling things well. Continue current dose.  No changes.       Right hip pain    Seeing ortho.  Planning for hip surgery tomorrow.        Pure hypercholesterolemia    Low cholesterol diet and exercise. Follow lipid panel.       Relevant Medications   aspirin EC 81 MG tablet   Obstructive sleep apnea    Has seen pulmonary.  Wearing cpap.        Hyperglycemia    Low carb diet and exercise.  Follow met  b and a1c.       History of colonic polyps    Colonoscopy 12/13/19 - diverticulosis and internal hemorrhoids.       GERD (gastroesophageal reflux disease)    Continues on protonix.        Relevant Medications   docusate sodium (COLACE) 100 MG capsule   Depression    Appears to be doing ok on zoloft.  Does not feel needs any further intervention.  Follow.        Return in about 4 months (around 04/21/2023) for physical with fasting labs 2 days prior.   I discussed the assessment and treatment plan with the patient. The patient was provided an opportunity to ask questions and all were answered. The patient agreed with the plan and demonstrated an understanding of the instructions.   The patient was advised to call back or seek an in-person evaluation if the symptoms worsen or if the condition fails to improve as anticipated.    Dale Tees Toh, MD

## 2022-12-21 DIAGNOSIS — M1611 Unilateral primary osteoarthritis, right hip: Secondary | ICD-10-CM | POA: Diagnosis not present

## 2022-12-21 DIAGNOSIS — M25551 Pain in right hip: Secondary | ICD-10-CM | POA: Diagnosis not present

## 2022-12-21 DIAGNOSIS — M1631 Unilateral osteoarthritis resulting from hip dysplasia, right hip: Secondary | ICD-10-CM | POA: Diagnosis not present

## 2022-12-21 DIAGNOSIS — Z4789 Encounter for other orthopedic aftercare: Secondary | ICD-10-CM | POA: Diagnosis not present

## 2022-12-21 DIAGNOSIS — R262 Difficulty in walking, not elsewhere classified: Secondary | ICD-10-CM | POA: Diagnosis not present

## 2022-12-21 DIAGNOSIS — Z96641 Presence of right artificial hip joint: Secondary | ICD-10-CM | POA: Diagnosis not present

## 2022-12-22 ENCOUNTER — Telehealth: Payer: Self-pay | Admitting: Internal Medicine

## 2022-12-22 ENCOUNTER — Ambulatory Visit: Payer: Medicare PPO | Admitting: Internal Medicine

## 2022-12-22 DIAGNOSIS — R262 Difficulty in walking, not elsewhere classified: Secondary | ICD-10-CM | POA: Diagnosis not present

## 2022-12-22 DIAGNOSIS — Z96641 Presence of right artificial hip joint: Secondary | ICD-10-CM | POA: Diagnosis not present

## 2022-12-22 DIAGNOSIS — R600 Localized edema: Secondary | ICD-10-CM | POA: Diagnosis not present

## 2022-12-22 NOTE — Telephone Encounter (Signed)
Pt husband called stating the surgery went well and successful. Pt husband stated in less than 24 hours since her surgery the pt is up and walking

## 2022-12-23 NOTE — Telephone Encounter (Signed)
Sent message through mychart

## 2022-12-23 NOTE — Telephone Encounter (Signed)
FYI

## 2022-12-24 ENCOUNTER — Encounter: Payer: Self-pay | Admitting: Internal Medicine

## 2022-12-24 NOTE — Assessment & Plan Note (Signed)
Continues on protonix.  ?

## 2022-12-24 NOTE — Assessment & Plan Note (Signed)
Low cholesterol diet and exercise.  Follow lipid panel.   

## 2022-12-24 NOTE — Assessment & Plan Note (Signed)
Colonoscopy 12/13/19 - diverticulosis and internal hemorrhoids.  

## 2022-12-24 NOTE — Assessment & Plan Note (Signed)
Low carb diet and exercise.  Follow met b and a1c.  

## 2022-12-24 NOTE — Assessment & Plan Note (Signed)
Appears to be doing ok on zoloft.  Does not feel needs any further intervention.  Follow.

## 2022-12-24 NOTE — Assessment & Plan Note (Signed)
Seeing ortho.  Planning for hip surgery tomorrow.

## 2022-12-24 NOTE — Assessment & Plan Note (Signed)
Has seen pulmonary.  Wearing cpap.

## 2022-12-24 NOTE — Assessment & Plan Note (Signed)
On zoloft now. Overall appears to be handling things well. Continue current dose.  No changes.

## 2022-12-26 ENCOUNTER — Telehealth: Payer: Self-pay

## 2022-12-26 NOTE — Telephone Encounter (Signed)
I left a voicemail for patient asking her to please call us back to schedule a physical in four months with fasting labs 1-2 days prior, per Dr. Dale Churchill.

## 2022-12-28 DIAGNOSIS — Z96641 Presence of right artificial hip joint: Secondary | ICD-10-CM | POA: Diagnosis not present

## 2023-01-04 DIAGNOSIS — M1631 Unilateral osteoarthritis resulting from hip dysplasia, right hip: Secondary | ICD-10-CM | POA: Diagnosis not present

## 2023-01-06 DIAGNOSIS — M1631 Unilateral osteoarthritis resulting from hip dysplasia, right hip: Secondary | ICD-10-CM | POA: Diagnosis not present

## 2023-01-09 DIAGNOSIS — M1631 Unilateral osteoarthritis resulting from hip dysplasia, right hip: Secondary | ICD-10-CM | POA: Diagnosis not present

## 2023-01-11 DIAGNOSIS — M1631 Unilateral osteoarthritis resulting from hip dysplasia, right hip: Secondary | ICD-10-CM | POA: Diagnosis not present

## 2023-01-18 DIAGNOSIS — M1631 Unilateral osteoarthritis resulting from hip dysplasia, right hip: Secondary | ICD-10-CM | POA: Diagnosis not present

## 2023-01-20 DIAGNOSIS — M1631 Unilateral osteoarthritis resulting from hip dysplasia, right hip: Secondary | ICD-10-CM | POA: Diagnosis not present

## 2023-01-30 DIAGNOSIS — Z96641 Presence of right artificial hip joint: Secondary | ICD-10-CM | POA: Diagnosis not present

## 2023-02-13 ENCOUNTER — Ambulatory Visit: Payer: Medicare PPO | Admitting: Internal Medicine

## 2023-03-02 ENCOUNTER — Telehealth: Payer: Self-pay | Admitting: Internal Medicine

## 2023-03-07 ENCOUNTER — Other Ambulatory Visit: Payer: Self-pay

## 2023-03-07 MED ORDER — SERTRALINE HCL 100 MG PO TABS: 100.0000 mg | ORAL_TABLET | Freq: Every day | ORAL | 1 refills | Status: DC

## 2023-03-07 MED ORDER — SERTRALINE HCL 50 MG PO TABS: 75.0000 mg | ORAL_TABLET | Freq: Every day | ORAL | 1 refills | Status: DC

## 2023-03-07 NOTE — Telephone Encounter (Signed)
FYI- called patient to clarify. She is taking 75 mg q day. [facility] with correct dose and instruction.

## 2023-03-07 NOTE — Telephone Encounter (Signed)
Prescription Request  03/07/2023  LOV: 09/20/2022  What is the name of the medication or equipment? sertraline   Have you contacted your pharmacy to request a refill? No   Which pharmacy would you like this sent to?  [id number] [place], [place] - [address] [place] [address] Phone: [phone] Fax: [phone]    Patient notified that their request is being sent to the clinical staff for review and that they should receive a response within 2 business days.   Please advise at Mobile [phone] (mobile)

## 2023-03-14 ENCOUNTER — Ambulatory Visit
Admission: RE | Admit: 2023-03-14 | Discharge: 2023-03-14 | Disposition: A | Discharge status: Home / Self Care | Payer: Medicare PPO | Source: Ambulatory Visit | Attending: Internal Medicine | Admitting: Internal Medicine

## 2023-03-14 ENCOUNTER — Telehealth: Payer: Self-pay

## 2023-03-14 DIAGNOSIS — Z1231 Encounter for screening mammogram for malignant neoplasm of breast: Secondary | ICD-10-CM | POA: Insufficient documentation

## 2023-03-14 DIAGNOSIS — E7800 Pure hypercholesterolemia, unspecified: Secondary | ICD-10-CM

## 2023-03-14 DIAGNOSIS — R739 Hyperglycemia, unspecified: Secondary | ICD-10-CM

## 2023-03-14 DIAGNOSIS — M85852 Other specified disorders of bone density and structure, left thigh: Secondary | ICD-10-CM | POA: Diagnosis not present

## 2023-03-14 DIAGNOSIS — Z780 Asymptomatic menopausal state: Secondary | ICD-10-CM | POA: Insufficient documentation

## 2023-03-14 NOTE — Telephone Encounter (Signed)
Patient states she is returning our call.  I read message to patient from Dr. Dale Catlin.  Patient states she would like to know how much calcium and vitamin D she should take.

## 2023-03-14 NOTE — Telephone Encounter (Signed)
Caltrate or oscal bid?

## 2023-03-14 NOTE — Telephone Encounter (Signed)
-----   Message from Dexter sent at 03/14/2023  1:03 PM EST ----- Notify - bone density reveals osteopenia. This means that she has had some bone loss, but does not meet criteria for osteoporosis.  Recommend calcium, vitamin D and weight bearing exercise.

## 2023-03-14 NOTE — Telephone Encounter (Signed)
Pt stated she spoke to [place] and was notified

## 2023-03-14 NOTE — Addendum Note (Signed)
Addended by: Rita Ohara D on: 03/14/2023 04:02 PM   Modules accepted: Orders

## 2023-03-14 NOTE — Telephone Encounter (Signed)
Caltrate 600mg  plus D bid or oscal plus D bid.

## 2023-03-14 NOTE — Telephone Encounter (Signed)
 Lvm for pt to give office a call back in regards to results

## 2023-03-14 NOTE — Telephone Encounter (Signed)
Patient aware of below. Also, future labs ordered and fasting lab appt scheduled./

## 2023-03-15 DIAGNOSIS — Z96641 Presence of right artificial hip joint: Secondary | ICD-10-CM | POA: Diagnosis not present

## 2023-03-27 ENCOUNTER — Other Ambulatory Visit (INDEPENDENT_AMBULATORY_CARE_PROVIDER_SITE_OTHER): Payer: Medicare PPO

## 2023-03-27 DIAGNOSIS — R739 Hyperglycemia, unspecified: Secondary | ICD-10-CM | POA: Diagnosis not present

## 2023-03-27 DIAGNOSIS — E7800 Pure hypercholesterolemia, unspecified: Secondary | ICD-10-CM | POA: Diagnosis not present

## 2023-03-27 LAB — BASIC METABOLIC PANEL
BUN: 17 mg/dL (ref 6–23)
CO2: 25 meq/L (ref 19–32)
Calcium: 10 mg/dL (ref 8.4–10.5)
Chloride: 106 meq/L (ref 96–112)
Creatinine, Ser: 0.79 mg/dL (ref 0.40–1.20)
GFR: 75.4 mL/min (ref >60.00)
Glucose, Bld: 106 mg/dL — ABNORMAL HIGH (ref 70–99)
Potassium: 4.2 meq/L (ref 3.5–5.1)
Sodium: 141 meq/L (ref 135–145)

## 2023-03-27 LAB — HEPATIC FUNCTION PANEL
ALT: 17 U/L (ref 0–35)
AST: 17 U/L (ref 0–37)
Albumin: 4.6 g/dL (ref 3.5–5.2)
Alkaline Phosphatase: 69 U/L (ref 39–117)
Bilirubin, Direct: 0.1 mg/dL (ref 0.0–0.3)
Total Bilirubin: 0.4 mg/dL (ref 0.2–1.2)
Total Protein: 7.3 g/dL (ref 6.0–8.3)

## 2023-03-27 LAB — LIPID PANEL
Cholesterol: 223 mg/dL — ABNORMAL HIGH (ref 0–200)
HDL: 60.8 mg/dL (ref >39.00)
LDL Cholesterol: 121 mg/dL — ABNORMAL HIGH (ref 0–99)
NonHDL: 162.02
Total CHOL/HDL Ratio: 4
Triglycerides: 204 mg/dL — ABNORMAL HIGH (ref 0.0–149.0)
VLDL: 40.8 mg/dL — ABNORMAL HIGH (ref 0.0–40.0)

## 2023-03-27 LAB — HEMOGLOBIN A1C: Hgb A1c MFr Bld: 5.8 % (ref 4.6–6.5)

## 2023-03-31 ENCOUNTER — Ambulatory Visit: Payer: Medicare PPO | Admitting: Internal Medicine

## 2023-04-10 ENCOUNTER — Ambulatory Visit: Payer: Medicare PPO | Admitting: Internal Medicine

## 2023-04-10 ENCOUNTER — Encounter: Payer: Self-pay | Admitting: Internal Medicine

## 2023-04-10 VITALS — BP 112/68 | HR 74 | Temp 98.0°F | Resp 16 | Ht 61.0 in | Wt 141.0 lb

## 2023-04-10 DIAGNOSIS — Z860100 Personal history of colon polyps, unspecified: Secondary | ICD-10-CM | POA: Diagnosis not present

## 2023-04-10 DIAGNOSIS — G4733 Obstructive sleep apnea (adult) (pediatric): Secondary | ICD-10-CM | POA: Diagnosis not present

## 2023-04-10 DIAGNOSIS — E7800 Pure hypercholesterolemia, unspecified: Secondary | ICD-10-CM

## 2023-04-10 DIAGNOSIS — R739 Hyperglycemia, unspecified: Secondary | ICD-10-CM

## 2023-04-10 DIAGNOSIS — F32A Depression, unspecified: Secondary | ICD-10-CM

## 2023-04-10 DIAGNOSIS — K219 Gastro-esophageal reflux disease without esophagitis: Secondary | ICD-10-CM

## 2023-04-10 DIAGNOSIS — F439 Reaction to severe stress, unspecified: Secondary | ICD-10-CM

## 2023-04-10 NOTE — Patient Instructions (Signed)
 Stop pravastatin.  Call with update over the next 2-3 weeks.

## 2023-04-10 NOTE — Progress Notes (Signed)
 Subjective:    Patient ID: Jessica Peck, female    DOB: 02-22-1951, 73 y.o.   MRN: [id number]  Patient here for  Chief Complaint  Patient presents with   Medical Management of Chronic Issues    HPI Here for a scheduled follow up - F/u regarding GERD, stress and hypercholesterolemia. Is s/p total hip arthroplasty 12/21/22. Had f/u with ortho 03/15/23 - no acute boney abnormality. Recommended to continue activities as tolerated. F/u one year. Appears to be doing relatively well. Remains on zoloft .  Handling stress. Using cpap. Breathing stable.    Past Medical History:  Diagnosis Date   Chronic constipation    Esophagitis    Family history of breast cancer    Family history of colon cancer    Gastritis    EGD (hiatal hernia)   GERD (gastroesophageal reflux disease)    Hypercholesterolemia    Sleep apnea    CPAP   Vitamin D deficiency    Past Surgical History:  Procedure Laterality Date   APPENDECTOMY  1976   Family History  Problem Relation Age of Onset   Colon cancer Mother 80   COPD Mother    Heart disease Mother    Colon cancer Father        dx 2s   Breast cancer Sister 52       died age 18   Breast cancer Maternal Uncle        dx 36s   Breast cancer Paternal Aunt    Breast cancer Paternal Aunt    Social History   Socioeconomic History   Marital status: Married    Spouse name: Alm   Number of children: 2   Years of education: Not on file   Highest education level: Not on file  Occupational History   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  Tobacco Use   Smoking status: Former    Current packs/day: 0.00    Average packs/day: 0.1 packs/day for 1 year (0.1 ttl pk-yrs)    Types: Cigarettes    Start date: 20    Quit date: 1974    Years since quitting: 51.0   Smokeless tobacco: Never   Tobacco comments:    Smoked socially in college  Vaping Use   Vaping status: Never Used  Substance and Sexual  Activity   Alcohol use: Yes    Alcohol/week: 3.0 standard drinks of alcohol    Types: 3 Glasses of wine per week    Comment: 1 glass of wine 3 times per week   Drug use: No   Sexual activity: Not on file  Other Topics Concern   Not on file  Social History Narrative   Not on file   Social Drivers of Health   Financial Resource Strain: Low Risk  (11/24/2022)   Overall Financial Resource Strain (CARDIA)    Difficulty of Paying Living Expenses: Not hard at all  Food Insecurity: No Food Insecurity (11/24/2022)   Hunger Vital Sign    Worried About Running Out of Food in the Last Year: Never true    Ran Out of Food in the Last Year: Never true  Transportation Needs: No Transportation Needs (11/24/2022)   PRAPARE - Administrator, Civil Service (Medical): No    Lack of Transportation (Non-Medical): No  Physical Activity: Inactive (11/24/2022)   Exercise Vital Sign    Days of Exercise per Week: 0 days    Minutes of Exercise per Session:  0 min  Stress: No Stress Concern Present (11/24/2022)   [employer] of Occupational Health - Occupational Stress Questionnaire    Feeling of Stress : Not at all  Social Connections: Socially Integrated (11/24/2022)   Social Connection and Isolation Panel [NHANES]    Frequency of Communication with Friends and Family: More than three times a week    Frequency of Social Gatherings with Friends and Family: Once a week    Attends Religious Services: More than 4 times per year    Active Member of [employer] or Organizations: Yes    Attends Engineer, Structural: More than 4 times per year    Marital Status: Married     Review of Systems  Constitutional:  Negative for appetite change and unexpected weight change.  HENT:  Negative for congestion and sinus pressure.   Respiratory:  Negative for cough, chest tightness and shortness of breath.   Cardiovascular:  Negative for chest pain and palpitations.  Gastrointestinal:  Negative for  abdominal pain, diarrhea, nausea and vomiting.  Genitourinary:  Negative for difficulty urinating and dysuria.  Musculoskeletal:  Negative for joint swelling and myalgias.  Skin:  Negative for color change and rash.  Neurological:  Negative for dizziness and headaches.  Psychiatric/Behavioral:  Negative for agitation and dysphoric mood.        Objective:     BP 112/68   Pulse 74   Temp 98 F (36.7 C)   Resp 16   Ht 5' 1 (1.549 m)   Wt 141 lb (64 kg)   LMP 06/04/2000   SpO2 98%   BMI 26.64 kg/m  Wt Readings from Last 3 Encounters:  04/10/23 141 lb (64 kg)  12/20/22 137 lb (62.1 kg)  11/24/22 136 lb (61.7 kg)    Physical Exam Vitals reviewed.  Constitutional:      General: She is not in acute distress.    Appearance: Normal appearance.  HENT:     Head: Normocephalic and atraumatic.     Right Ear: External ear normal.     Left Ear: External ear normal.     Mouth/Throat:     Pharynx: No oropharyngeal exudate or posterior oropharyngeal erythema.  Eyes:     General: No scleral icterus.       Right eye: No discharge.        Left eye: No discharge.     Conjunctiva/sclera: Conjunctivae normal.  Neck:     Thyroid : No thyromegaly.  Cardiovascular:     Rate and Rhythm: Normal rate and regular rhythm.  Pulmonary:     Effort: No respiratory distress.     Breath sounds: Normal breath sounds. No wheezing.  Abdominal:     General: Bowel sounds are normal.     Palpations: Abdomen is soft.     Tenderness: There is no abdominal tenderness.  Musculoskeletal:        General: No swelling or tenderness.     Cervical back: Neck supple. No tenderness.  Lymphadenopathy:     Cervical: No cervical adenopathy.  Skin:    Findings: No erythema or rash.  Neurological:     Mental Status: She is alert.  Psychiatric:        Mood and Affect: Mood normal.        Behavior: Behavior normal.      Outpatient Encounter Medications as of 04/10/2023  Medication Sig   docusate sodium  (COLACE) 100 MG capsule Take 1 capsule twice a day by oral route.   melatonin  5 MG TABS Take 5 mg by mouth at bedtime.   pantoprazole  (PROTONIX ) 20 MG tablet TAKE 1 TO 2 TABS BY MOUTH EVERY DAY   sertraline  (ZOLOFT ) 50 MG tablet Take 1.5 tablets (75 mg total) by mouth daily.   VITAMIN D, CHOLECALCIFEROL, PO Take 1,000 Units by mouth daily.   [DISCONTINUED] aspirin EC 81 MG tablet 81 mg.   [DISCONTINUED] methocarbamol (ROBAXIN) 500 MG tablet Take 1 tablet 3 times a day by oral route.   [DISCONTINUED] oxyCODONE (OXY IR/ROXICODONE) 5 MG immediate release tablet 1 tab PO Q4H PRN PAIN   [DISCONTINUED] pravastatin  (PRAVACHOL ) 10 MG tablet TAKE 1 TABLET BY MOUTH EVERY DAY   No facility-administered encounter medications on file as of 04/10/2023.     Lab Results  Component Value Date   WBC 5.0 09/21/2022   HGB 12.3 09/21/2022   HCT 37.1 09/21/2022   PLT 319.0 09/21/2022   GLUCOSE 106 (H) 03/27/2023   CHOL 223 (H) 03/27/2023   TRIG 204.0 (H) 03/27/2023   HDL 60.80 03/27/2023   LDLDIRECT 140.0 09/21/2022   LDLCALC 121 (H) 03/27/2023   ALT 17 03/27/2023   AST 17 03/27/2023   NA 141 03/27/2023   K 4.2 03/27/2023   CL 106 03/27/2023   CREATININE 0.79 03/27/2023   BUN 17 03/27/2023   CO2 25 03/27/2023   TSH 4.14 09/21/2022   HGBA1C 5.8 03/27/2023    MM 3D SCREENING MAMMOGRAM BILATERAL BREAST Result Date: 03/15/2023 CLINICAL DATA:  Screening. EXAM: DIGITAL SCREENING BILATERAL MAMMOGRAM WITH TOMOSYNTHESIS AND CAD TECHNIQUE: Bilateral screening digital craniocaudal and mediolateral oblique mammograms were obtained. Bilateral screening digital breast tomosynthesis was performed. The images were evaluated with computer-aided detection. COMPARISON:  Previous exam(s). ACR Breast Density Category b: There are scattered areas of fibroglandular density. FINDINGS: There are no findings suspicious for malignancy. IMPRESSION: No mammographic evidence of malignancy. A result letter of this screening  mammogram will be mailed directly to the patient. RECOMMENDATION: Screening mammogram in one year. (Code:SM-B-01Y) BI-RADS CATEGORY  1: Negative. Electronically Signed   By: Alm Parkins M.D.   On: 03/15/2023 16:18   DG Bone Density Result Date: 03/14/2023 EXAM: DUAL X-RAY ABSORPTIOMETRY (DXA) FOR BONE MINERAL DENSITY IMPRESSION: Your patient Nixie Laube completed a BMD test on 03/14/2023 using the [employer] iDXA DXA System (software version: 14.10) manufactured by [employer]. The following summarizes the results of our evaluation. Technologist:VLM PATIENT BIOGRAPHICAL: Name: Marypat, Kimmet Patient ID: [id number] Birth Date: Dec 13, 1950 Height: 60.0 in. Gender: Female Exam Date: 03/14/2023 Weight: 137.0 lbs. Indications: Caucasian, Hip Replacement Right Fractures: Treatments: Vitamin D DENSITOMETRY RESULTS: Site         Region     Measured Date Measured Age WHO Classification Young Adult T-score BMD         %Change vs. Previous Significant Change (*) Left Femur Neck 03/14/2023 71.1 Osteopenia -2.0 0.757 g/cm2 - - Left Forearm Radius 33% 03/14/2023 71.1 Normal -1.0 0.787 g/cm2 - - ASSESSMENT: The BMD measured at Femur Neck is 0.757 g/cm2 with a T-score of -2.0. This patient is considered osteopenic according to World Health Organization [facility]) criteria. Lumbar spine was not utilized due to advanced degenerative changes. The right femur was not utilized due to surgical hardware. The scan quality is good. World Health Organization [facility]) criteria for post-menopausal, Caucasian Women: Normal:                   T-score at or above -1 SD Osteopenia/low bone mass: T-score between -1 and -  2.5 SD Osteoporosis:             T-score at or below -2.5 SD RECOMMENDATIONS: 1. All patients should optimize calcium  and vitamin D intake. 2. Consider FDA-approved medical therapies in postmenopausal women and men aged 48 years and older, based on the following: a. A hip or vertebral(clinical or morphometric) fracture  b. T-score < -2.5 at the femoral neck or spine after appropriate evaluation to exclude secondary causes c. Low bone mass (T-score between -1.0 and -2.5 at the femoral neck or spine) and a 10-year probability of a hip fracture > 3% or a 10-year probability of a major osteoporosis-related fracture > 20% based on the [place] -adapted WHO algorithm 3. Clinician judgment and/or patient preferences may indicate treatment for people with 10-year fracture probabilities above or below these levels FOLLOW-UP: People with diagnosed cases of osteoporosis or at high risk for fracture should have regular bone mineral density tests. For patients eligible for Medicare, routine testing is allowed once every 2 years. The testing frequency can be increased to one year for patients who have rapidly progressing disease, those who are receiving or discontinuing medical therapy to restore bone mass, or have additional risk factors. I have reviewed this report, and agree with the above findings. [facility] Dear ALLENA HAMILTON, Your patient TA FAIR completed a FRAX assessment on 03/14/2023 using the [employer] iDXA DXA System (analysis version: 14.10) manufactured by [employer]. The following summarizes the results of our evaluation. PATIENT BIOGRAPHICAL: Name: Millissa, Deese Patient ID: [id number] Birth Date: 01-06-1951 Height:    60.0 in. Gender:     Female    [age]       Weight:    137.0 lbs. Ethnicity:  White                            Exam Date: 03/14/2023 FRAX* RESULTS:  (version: 3.5) 10-year Probability of Fracture1 Major Osteoporotic Fracture2 Hip Fracture 12.4% 2.6% Population: [place]  (Caucasian) Risk Factors: None Based on Femur (Left) Neck BMD 1 -The 10-year probability of fracture may be lower than reported if the patient has received treatment. 2 -Major Osteoporotic Fracture: Clinical Spine, Forearm, Hip or Shoulder *FRAX is a armed forces logistics/support/administrative officer of the [employer] of [employer] for  Metabolic Bone Disease, a World Science Writer (WHO) [employer]. ASSESSMENT: The probability of a major osteoporotic fracture is 12.4% within the next ten years. The probability of a hip fracture is 2.6% within the next ten years. Electronically Signed   By: Rosaline Collet M.D.   On: 03/14/2023 11:20       Assessment & Plan:  Hyperglycemia Assessment & Plan: Low carb diet and exercise.  Follow met b and a1c.   Orders: -     Hemoglobin A1c; Future  Pure hypercholesterolemia Assessment & Plan: Low cholesterol diet and exercise. Follow lipid panel.   Orders: -     Lipid panel; Future -     Hepatic function panel; Future -     Basic metabolic panel; Future  Stress Assessment & Plan: On zoloft  now. Overall appears to be handling things well. Continue zoloft . Follow.    Obstructive sleep apnea Assessment & Plan: Has seen pulmonary.  Wearing cpap.     History of colonic polyps Assessment & Plan: Colonoscopy 12/13/19 - diverticulosis and internal hemorrhoids.    Gastroesophageal reflux disease, unspecified whether esophagitis present Assessment & Plan: Continues  on protonix .     Depression, unspecified depression type Assessment & Plan: Appears to be doing ok on zoloft .  Does not feel needs any further intervention.  Follow.       Allena Hamilton, MD

## 2023-04-16 ENCOUNTER — Encounter: Payer: Self-pay | Admitting: Internal Medicine

## 2023-04-16 NOTE — Assessment & Plan Note (Signed)
Low carb diet and exercise.  Follow met b and a1c.   

## 2023-04-16 NOTE — Assessment & Plan Note (Signed)
Low cholesterol diet and exercise.  Follow lipid panel.   

## 2023-04-16 NOTE — Assessment & Plan Note (Signed)
On zoloft now. Overall appears to be handling things well. Continue zoloft. Follow.

## 2023-04-16 NOTE — Assessment & Plan Note (Signed)
Continues on protonix.  ?

## 2023-04-16 NOTE — Assessment & Plan Note (Signed)
Appears to be doing ok on zoloft.  Does not feel needs any further intervention.  Follow.

## 2023-04-16 NOTE — Assessment & Plan Note (Signed)
Colonoscopy 12/13/19 - diverticulosis and internal hemorrhoids.  

## 2023-04-16 NOTE — Assessment & Plan Note (Signed)
Has seen pulmonary.  Wearing cpap.

## 2023-04-22 ENCOUNTER — Encounter: Payer: Self-pay | Admitting: Internal Medicine

## 2023-04-24 NOTE — Telephone Encounter (Signed)
Ortho has cleared her for flu shot. Ok to schedule NV for her to get it?

## 2023-04-24 NOTE — Telephone Encounter (Signed)
Ok for flu vaccine as long as she has not allergy, etc.  Not too late.

## 2023-05-01 ENCOUNTER — Ambulatory Visit: Payer: Medicare PPO | Admitting: Physician Assistant

## 2023-06-29 ENCOUNTER — Telehealth: Payer: Self-pay

## 2023-06-29 NOTE — Telephone Encounter (Signed)
 I left a voicemail for patient letting her know that we had to reschedule her appointment with Dr. Dale [place] on 08/08/2023 from 1pm to 2:30pm due to a meeting Dr. Lorin Picket will be attending.  I asked patient to please let us know if this will not work with her schedule.  I also sent a letter to patient via MyChart.  E2C2 - if patient calls back to reschedule, please reschedule appointment.

## 2023-08-04 ENCOUNTER — Other Ambulatory Visit (INDEPENDENT_AMBULATORY_CARE_PROVIDER_SITE_OTHER): Payer: Medicare PPO

## 2023-08-04 DIAGNOSIS — E7800 Pure hypercholesterolemia, unspecified: Secondary | ICD-10-CM | POA: Diagnosis not present

## 2023-08-04 DIAGNOSIS — R739 Hyperglycemia, unspecified: Secondary | ICD-10-CM

## 2023-08-04 LAB — BASIC METABOLIC PANEL WITH GFR
BUN: 17 mg/dL (ref 6–23)
CO2: 24 meq/L (ref 19–32)
Calcium: 9.7 mg/dL (ref 8.4–10.5)
Chloride: 108 meq/L (ref 96–112)
Creatinine, Ser: 0.79 mg/dL (ref 0.40–1.20)
GFR: 75.21 mL/min (ref >60.00)
Glucose, Bld: 101 mg/dL — ABNORMAL HIGH (ref 70–99)
Potassium: 4.1 meq/L (ref 3.5–5.1)
Sodium: 140 meq/L (ref 135–145)

## 2023-08-04 LAB — LIPID PANEL
Cholesterol: 221 mg/dL — ABNORMAL HIGH (ref 0–200)
HDL: 58 mg/dL (ref >39.00)
LDL Cholesterol: 134 mg/dL — ABNORMAL HIGH (ref 0–99)
NonHDL: 163.35
Total CHOL/HDL Ratio: 4
Triglycerides: 148 mg/dL (ref 0.0–149.0)
VLDL: 29.6 mg/dL (ref 0.0–40.0)

## 2023-08-04 LAB — HEPATIC FUNCTION PANEL
ALT: 16 U/L (ref 0–35)
AST: 17 U/L (ref 0–37)
Albumin: 4.6 g/dL (ref 3.5–5.2)
Alkaline Phosphatase: 51 U/L (ref 39–117)
Bilirubin, Direct: 0 mg/dL (ref 0.0–0.3)
Total Bilirubin: 0.4 mg/dL (ref 0.2–1.2)
Total Protein: 7 g/dL (ref 6.0–8.3)

## 2023-08-04 LAB — HEMOGLOBIN A1C: Hgb A1c MFr Bld: 6 % (ref 4.6–6.5)

## 2023-08-08 ENCOUNTER — Ambulatory Visit (INDEPENDENT_AMBULATORY_CARE_PROVIDER_SITE_OTHER): Admitting: Internal Medicine

## 2023-08-08 ENCOUNTER — Encounter: Payer: Medicare PPO | Admitting: Internal Medicine

## 2023-08-08 ENCOUNTER — Encounter: Payer: Self-pay | Admitting: Internal Medicine

## 2023-08-08 VITALS — BP 112/64 | HR 70 | Temp 98.5°F | Ht 61.0 in | Wt 139.4 lb

## 2023-08-08 DIAGNOSIS — E7800 Pure hypercholesterolemia, unspecified: Secondary | ICD-10-CM | POA: Diagnosis not present

## 2023-08-08 DIAGNOSIS — G4733 Obstructive sleep apnea (adult) (pediatric): Secondary | ICD-10-CM | POA: Diagnosis not present

## 2023-08-08 DIAGNOSIS — K219 Gastro-esophageal reflux disease without esophagitis: Secondary | ICD-10-CM | POA: Diagnosis not present

## 2023-08-08 DIAGNOSIS — F32A Depression, unspecified: Secondary | ICD-10-CM

## 2023-08-08 DIAGNOSIS — Z713 Dietary counseling and surveillance: Secondary | ICD-10-CM | POA: Diagnosis not present

## 2023-08-08 DIAGNOSIS — Z0000 Encounter for general adult medical examination without abnormal findings: Secondary | ICD-10-CM | POA: Diagnosis not present

## 2023-08-08 DIAGNOSIS — R739 Hyperglycemia, unspecified: Secondary | ICD-10-CM

## 2023-08-08 DIAGNOSIS — Z860100 Personal history of colon polyps, unspecified: Secondary | ICD-10-CM | POA: Diagnosis not present

## 2023-08-08 MED ORDER — SERTRALINE HCL 50 MG PO TABS: 75.0000 mg | ORAL_TABLET | Freq: Every day | ORAL | 1 refills | Status: DC

## 2023-08-08 MED ORDER — LOVASTATIN 10 MG PO TABS: ORAL_TABLET | ORAL | 1 refills | Status: DC

## 2023-08-08 NOTE — Progress Notes (Signed)
 Subjective:    Patient ID: Jessica Peck, female    DOB: 07-11-1950, 73 y.o.   MRN: [id number]  Patient here for  Chief Complaint  Patient presents with   Annual Exam    HPI Here for a physical exam.  Is s/p total hip arthroplasty 12/21/22. Had f/u with ortho 03/15/23 - no acute boney abnormality. Recommended to continue activities as tolerated. F/u one year.  remains on zoloft . Using cpap. Discussed labs. Had intolerance to previous medication. Discussed treatment options. Agreed to start lovastatin . Discussed diet and exercise. Discussed referral to weight management program. No chest pain or sob reported. No abdominal pain or bowel change reported. Handling sress.    Past Medical History:  Diagnosis Date   Chronic constipation    Esophagitis    Family history of breast cancer    Family history of colon cancer    Gastritis    EGD (hiatal hernia)   GERD (gastroesophageal reflux disease)    Hypercholesterolemia    Sleep apnea    CPAP   Vitamin D deficiency    Past Surgical History:  Procedure Laterality Date   APPENDECTOMY  1976   Family History  Problem Relation Age of Onset   Colon cancer Mother 63   COPD Mother    Heart disease Mother    Colon cancer Father        dx 40s   Breast cancer Sister 20       died age 90   Breast cancer Maternal Uncle        dx 65s   Breast cancer Paternal Aunt    Breast cancer Paternal Aunt    Social History   Socioeconomic History   Marital status: Married    Spouse name: Myrtie Atkinson   Number of children: 2   Years of education: Not on file   Highest education level: Not on file  Occupational History   Occupation: retired Research scientist (medical)    Comment: substitue teaches occassionally  Tobacco Use   Smoking status: Former    Current packs/day: 0.00    Average packs/day: 0.1 packs/day for 1 year (0.1 ttl pk-yrs)    Types: Cigarettes    Start date: 52    Quit date: 1974    Years since quitting: 51.4   Smokeless tobacco:  Never   Tobacco comments:    Smoked socially in college  Vaping Use   Vaping status: Never Used  Substance and Sexual Activity   Alcohol use: Yes    Alcohol/week: 3.0 standard drinks of alcohol    Types: 3 Glasses of wine per week    Comment: 1 glass of wine 3 times per week   Drug use: No   Sexual activity: Not on file  Other Topics Concern   Not on file  Social History Narrative   Not on file   Social Drivers of Health   Financial Resource Strain: Low Risk  (11/24/2022)   Overall Financial Resource Strain (CARDIA)    Difficulty of Paying Living Expenses: Not hard at all  Food Insecurity: No Food Insecurity (11/24/2022)   Hunger Vital Sign    Worried About Running Out of Food in the Last Year: Never true    Ran Out of Food in the Last Year: Never true  Transportation Needs: No Transportation Needs (11/24/2022)   PRAPARE - Administrator, Civil Service (Medical): No    Lack of Transportation (Non-Medical): No  Physical Activity: Inactive (11/24/2022)   Exercise Vital  Sign    Days of Exercise per Week: 0 days    Minutes of Exercise per Session: 0 min  Stress: No Stress Concern Present (11/24/2022)   [employer] of Occupational Health - Occupational Stress Questionnaire    Feeling of Stress : Not at all  Social Connections: Socially Integrated (11/24/2022)   Social Connection and Isolation Panel [NHANES]    Frequency of Communication with Friends and Family: More than three times a week    Frequency of Social Gatherings with Friends and Family: Once a week    Attends Religious Services: More than 4 times per year    Active Member of [employer] or Organizations: Yes    Attends Engineer, structural: More than 4 times per year    Marital Status: Married     Review of Systems  Constitutional:  Negative for appetite change and unexpected weight change.  HENT:  Negative for congestion, sinus pressure and sore throat.   Eyes:  Negative for pain and visual  disturbance.  Respiratory:  Negative for cough, chest tightness and shortness of breath.   Cardiovascular:  Negative for chest pain, palpitations and leg swelling.  Gastrointestinal:  Negative for abdominal pain, diarrhea, nausea and vomiting.  Genitourinary:  Negative for difficulty urinating and dysuria.  Musculoskeletal:  Negative for joint swelling and myalgias.  Skin:  Negative for color change and rash.  Neurological:  Negative for dizziness and headaches.  Hematological:  Negative for adenopathy. Does not bruise/bleed easily.  Psychiatric/Behavioral:  Negative for agitation and dysphoric mood.        Objective:     BP 112/64   Pulse 70   Temp 98.5 F (36.9 C) (Oral)   Ht 5\' 1"  (1.549 m)   Wt 139 lb 6.4 oz (63.2 kg)   LMP 06/04/2000   SpO2 96%   BMI 26.34 kg/m  Wt Readings from Last 3 Encounters:  08/08/23 139 lb 6.4 oz (63.2 kg)  04/10/23 141 lb (64 kg)  12/20/22 137 lb (62.1 kg)    Physical Exam Vitals reviewed.  Constitutional:      General: She is not in acute distress.    Appearance: Normal appearance. She is well-developed.  HENT:     Head: Normocephalic and atraumatic.     Right Ear: External ear normal.     Left Ear: External ear normal.     Mouth/Throat:     Pharynx: No oropharyngeal exudate or posterior oropharyngeal erythema.  Eyes:     General: No scleral icterus.       Right eye: No discharge.        Left eye: No discharge.     Conjunctiva/sclera: Conjunctivae normal.  Neck:     Thyroid : No thyromegaly.  Cardiovascular:     Rate and Rhythm: Normal rate and regular rhythm.  Pulmonary:     Effort: No tachypnea, accessory muscle usage or respiratory distress.     Breath sounds: Normal breath sounds. No decreased breath sounds or wheezing.  Chest:  Breasts:    Right: No inverted nipple, mass, nipple discharge or tenderness (no axillary adenopathy).     Left: No inverted nipple, mass, nipple discharge or tenderness (no axilarry adenopathy).   Abdominal:     General: Bowel sounds are normal.     Palpations: Abdomen is soft.     Tenderness: There is no abdominal tenderness.  Musculoskeletal:        General: No swelling or tenderness.     Cervical back: Neck supple.  Lymphadenopathy:     Cervical: No cervical adenopathy.  Skin:    Findings: No erythema or rash.  Neurological:     Mental Status: She is alert and oriented to person, place, and time.  Psychiatric:        Mood and Affect: Mood normal.        Behavior: Behavior normal.         Outpatient Encounter Medications as of 08/08/2023  Medication Sig   cyanocobalamin (VITAMIN B12) 1000 MCG tablet Take 1,000 mcg by mouth daily.   lovastatin  (MEVACOR ) 10 MG tablet Take one tablet q Monday, Wednesday and Friday.   docusate sodium (COLACE) 100 MG capsule Take 1 capsule twice a day by oral route.   melatonin 5 MG TABS Take 5 mg by mouth at bedtime.   pantoprazole  (PROTONIX ) 20 MG tablet TAKE 1 TO 2 TABS BY MOUTH EVERY DAY   sertraline  (ZOLOFT ) 50 MG tablet Take 1.5 tablets (75 mg total) by mouth daily.   VITAMIN D, CHOLECALCIFEROL, PO Take 1,000 Units by mouth daily.   [DISCONTINUED] sertraline  (ZOLOFT ) 50 MG tablet Take 1.5 tablets (75 mg total) by mouth daily.   No facility-administered encounter medications on file as of 08/08/2023.     Lab Results  Component Value Date   WBC 5.0 09/21/2022   HGB 12.3 09/21/2022   HCT 37.1 09/21/2022   PLT 319.0 09/21/2022   GLUCOSE 101 (H) 08/04/2023   CHOL 221 (H) 08/04/2023   TRIG 148.0 08/04/2023   HDL 58.00 08/04/2023   LDLDIRECT 140.0 09/21/2022   LDLCALC 134 (H) 08/04/2023   ALT 16 08/04/2023   AST 17 08/04/2023   NA 140 08/04/2023   K 4.1 08/04/2023   CL 108 08/04/2023   CREATININE 0.79 08/04/2023   BUN 17 08/04/2023   CO2 24 08/04/2023   TSH 4.14 09/21/2022   HGBA1C 6.0 08/04/2023    MM 3D SCREENING MAMMOGRAM BILATERAL BREAST Result Date: 03/15/2023 CLINICAL DATA:  Screening. EXAM: DIGITAL SCREENING  BILATERAL MAMMOGRAM WITH TOMOSYNTHESIS AND CAD TECHNIQUE: Bilateral screening digital craniocaudal and mediolateral oblique mammograms were obtained. Bilateral screening digital breast tomosynthesis was performed. The images were evaluated with computer-aided detection. COMPARISON:  Previous exam(s). ACR Breast Density Category b: There are scattered areas of fibroglandular density. FINDINGS: There are no findings suspicious for malignancy. IMPRESSION: No mammographic evidence of malignancy. A result letter of this screening mammogram will be mailed directly to the patient. RECOMMENDATION: Screening mammogram in one year. (Code:SM-B-01Y) BI-RADS CATEGORY  1: Negative. Electronically Signed   By: Amanda Jungling M.D.   On: 03/15/2023 16:18   DG Bone Density Result Date: 03/14/2023 EXAM: DUAL X-RAY ABSORPTIOMETRY (DXA) FOR BONE MINERAL DENSITY IMPRESSION: Your patient Danyia Borunda completed a BMD test on 03/14/2023 using the [employer] iDXA DXA System (software version: 14.10) manufactured by [employer]. The following summarizes the results of our evaluation. Technologist:VLM PATIENT BIOGRAPHICAL: Name: Taylar, Hartsough Patient ID: [id number] Birth Date: [birth year] Height: 60.0 in. Gender: Female Exam Date: 03/14/2023 Weight: 137.0 lbs. Indications: Caucasian, Hip Replacement Right Fractures: Treatments: Vitamin D DENSITOMETRY RESULTS: Site         Region     Measured Date Measured Age WHO Classification Young Adult T-score BMD         %Change vs. Previous Significant Change (*) Left Femur Neck 03/14/2023 71.1 Osteopenia -2.0 0.757 g/cm2 - - Left Forearm Radius 33% 03/14/2023 71.1 Normal -1.0 0.787 g/cm2 - - ASSESSMENT: The BMD measured at Femur Neck is 0.757 g/cm2  with a T-score of -2.0. This patient is considered osteopenic according to World Health Organization [facility]) criteria. Lumbar spine was not utilized due to advanced degenerative changes. The right femur was not utilized due to surgical  hardware. The scan quality is good. World Science writer [facility]) criteria for post-menopausal, Caucasian Women: Normal:                   T-score at or above -1 SD Osteopenia/low bone mass: T-score between -1 and -2.5 SD Osteoporosis:             T-score at or below -2.5 SD RECOMMENDATIONS: 1. All patients should optimize calcium  and vitamin D intake. 2. Consider FDA-approved medical therapies in postmenopausal women and men aged 76 years and older, based on the following: a. A hip or vertebral(clinical or morphometric) fracture b. T-score < -2.5 at the femoral neck or spine after appropriate evaluation to exclude secondary causes c. Low bone mass (T-score between -1.0 and -2.5 at the femoral neck or spine) and a 10-year probability of a hip fracture > 3% or a 10-year probability of a major osteoporosis-related fracture > 20% based on the [place] -adapted WHO algorithm 3. Clinician judgment and/or patient preferences may indicate treatment for people with 10-year fracture probabilities above or below these levels FOLLOW-UP: People with diagnosed cases of osteoporosis or at high risk for fracture should have regular bone mineral density tests. For patients eligible for Medicare, routine testing is allowed once every 2 years. The testing frequency can be increased to one year for patients who have rapidly progressing disease, those who are receiving or discontinuing medical therapy to restore bone mass, or have additional risk factors. I have reviewed this report, and agree with the above findings. [facility] Dear Dellar Fenton, Your patient AVAREE GILBERTI completed a FRAX assessment on 03/14/2023 using the [employer] iDXA DXA System (analysis version: 14.10) manufactured by [employer]. The following summarizes the results of our evaluation. PATIENT BIOGRAPHICAL: Name: Jonna, Dittrich Patient ID: [id number] Birth Date: August 27, 1950 Height:    60.0 in. Gender:     [age]    [age]       Weight:     137.0 lbs. Ethnicity:  White                            Exam Date: 03/14/2023 FRAX* RESULTS:  (version: 3.5) 10-year Probability of Fracture1 Major Osteoporotic Fracture2 Hip Fracture 12.4% 2.6% Population: [place]  (Caucasian) Risk Factors: None Based on Femur (Left) Neck BMD 1 -The 10-year probability of fracture may be lower than reported if the patient has received treatment. 2 -Major Osteoporotic Fracture: Clinical Spine, Forearm, Hip or Shoulder *FRAX is a Armed forces logistics/support/administrative officer of the [employer] of [employer] for Metabolic Bone Disease, a World Science writer (WHO) [employer]. ASSESSMENT: The probability of a major osteoporotic fracture is 12.4% within the next ten years. The probability of a hip fracture is 2.6% within the next ten years. Electronically Signed   By: Alinda Apley M.D.   On: 03/14/2023 11:20       Assessment & Plan:  Routine general medical examination at a health care facility  Health care maintenance Assessment & Plan: Physical 08/08/23.  PAP 12/17/18 - negative with negative HPV.  Mammogram 03/14/23 - Birads I. Colonoscopy 12/13/19 - internal hemorrhoids and diverticulosis.    Pure hypercholesterolemia Assessment & Plan: The 10-year ASCVD risk  score (Arnett DK, et al., 2019) is: 8.4%   Values used to calculate the score:     Age: 13 years     Sex: Female     Is Non-Hispanic African American: No     Diabetic: No     Tobacco smoker: No     Systolic Blood Pressure: 112 mmHg     Is BP treated: No     HDL Cholesterol: 58 mg/dL     Total Cholesterol: 221 mg/dL  Low cholesterol diet and exercise.  Discussed treatment options. Agreeable to start lovastatin . Follow llpid panel.   Orders: -     Lipid panel; Future -     Hepatic function panel; Future -     Basic metabolic panel with GFR; Future -     TSH; Future -     CBC with Differential/Platelet; Future -     Hepatic function panel; Future  Hyperglycemia Assessment & Plan: Low carb diet  and exercise. Follow met b and A1c.   Orders: -     Hemoglobin A1c; Future  Depression, unspecified depression type Assessment & Plan: Continues on zoloft . Doing well on current regimen. N changes today. Follow.    Gastroesophageal reflux disease, unspecified whether esophagitis present Assessment & Plan: Continue on PPI. No upper symptoms reported.    History of colonic polyps Assessment & Plan: Colonoscopy 12/13/19 - diverticulosis and internal hemorrhoids.    Obstructive sleep apnea Assessment & Plan: Has seen pulmonary. Wearing cpap.    Encounter for weight loss counseling Assessment & Plan: Discussed. Agreeable to referral for weight management program.   Orders: -     Amb Ref to Medical Weight Management  Other orders -     Sertraline  HCl; Take 1.5 tablets (75 mg total) by mouth daily.  Dispense: 135 tablet; Refill: 1 -     Lovastatin ; Take one tablet q Monday, Wednesday and Friday.  Dispense: 39 tablet; Refill: 1     Dellar Fenton, MD

## 2023-08-08 NOTE — Assessment & Plan Note (Addendum)
 The 10-year ASCVD risk score (Arnett DK, et al., 2019) is: 8.4%   Values used to calculate the score:     Age: 73 years     Sex: Female     Is Non-Hispanic African American: No     Diabetic: No     Tobacco smoker: No     Systolic Blood Pressure: 112 mmHg     Is BP treated: No     HDL Cholesterol: 58 mg/dL     Total Cholesterol: 221 mg/dL  Low cholesterol diet and exercise.  Discussed treatment options. Agreeable to start lovastatin . Follow llpid panel.

## 2023-08-08 NOTE — Assessment & Plan Note (Signed)
 Physical 08/08/23.  PAP 12/17/18 - negative with negative HPV.  Mammogram 03/14/23 - Birads I. Colonoscopy 12/13/19 - internal hemorrhoids and diverticulosis.

## 2023-08-14 ENCOUNTER — Encounter: Payer: Self-pay | Admitting: Internal Medicine

## 2023-08-14 DIAGNOSIS — Z713 Dietary counseling and surveillance: Secondary | ICD-10-CM | POA: Insufficient documentation

## 2023-08-14 NOTE — Assessment & Plan Note (Signed)
 Continue on PPI. No upper symptoms reported.

## 2023-08-14 NOTE — Assessment & Plan Note (Signed)
 Continues on zoloft . Doing well on current regimen. N changes today. Follow.

## 2023-08-14 NOTE — Assessment & Plan Note (Signed)
 Low-carb diet and exercise.  Follow met b and A1c.

## 2023-08-14 NOTE — Assessment & Plan Note (Signed)
Colonoscopy 12/13/19 - diverticulosis and internal hemorrhoids.  

## 2023-08-14 NOTE — Assessment & Plan Note (Signed)
Has seen pulmonary.  Wearing cpap.

## 2023-08-14 NOTE — Assessment & Plan Note (Signed)
 Discussed. Agreeable to referral for weight management program.

## 2023-08-15 ENCOUNTER — Encounter (INDEPENDENT_AMBULATORY_CARE_PROVIDER_SITE_OTHER): Payer: Self-pay

## 2023-09-19 ENCOUNTER — Other Ambulatory Visit (INDEPENDENT_AMBULATORY_CARE_PROVIDER_SITE_OTHER)

## 2023-09-19 DIAGNOSIS — E7800 Pure hypercholesterolemia, unspecified: Secondary | ICD-10-CM | POA: Diagnosis not present

## 2023-09-20 DIAGNOSIS — H2513 Age-related nuclear cataract, bilateral: Secondary | ICD-10-CM | POA: Diagnosis not present

## 2023-09-20 DIAGNOSIS — H25013 Cortical age-related cataract, bilateral: Secondary | ICD-10-CM | POA: Diagnosis not present

## 2023-09-20 DIAGNOSIS — H0288A Meibomian gland dysfunction right eye, upper and lower eyelids: Secondary | ICD-10-CM | POA: Diagnosis not present

## 2023-09-20 DIAGNOSIS — H04123 Dry eye syndrome of bilateral lacrimal glands: Secondary | ICD-10-CM | POA: Diagnosis not present

## 2023-09-20 DIAGNOSIS — H0288B Meibomian gland dysfunction left eye, upper and lower eyelids: Secondary | ICD-10-CM | POA: Diagnosis not present

## 2023-09-20 LAB — HEPATIC FUNCTION PANEL
ALT: 14 U/L (ref 0–35)
AST: 16 U/L (ref 0–37)
Albumin: 4.4 g/dL (ref 3.5–5.2)
Alkaline Phosphatase: 54 U/L (ref 39–117)
Bilirubin, Direct: 0.1 mg/dL (ref 0.0–0.3)
Total Bilirubin: 0.4 mg/dL (ref 0.2–1.2)
Total Protein: 6.7 g/dL (ref 6.0–8.3)

## 2023-09-21 ENCOUNTER — Ambulatory Visit: Payer: Self-pay | Admitting: Internal Medicine

## 2023-09-21 DIAGNOSIS — D2262 Melanocytic nevi of left upper limb, including shoulder: Secondary | ICD-10-CM | POA: Diagnosis not present

## 2023-09-21 DIAGNOSIS — L821 Other seborrheic keratosis: Secondary | ICD-10-CM | POA: Diagnosis not present

## 2023-09-21 DIAGNOSIS — D2271 Melanocytic nevi of right lower limb, including hip: Secondary | ICD-10-CM | POA: Diagnosis not present

## 2023-09-21 DIAGNOSIS — D2272 Melanocytic nevi of left lower limb, including hip: Secondary | ICD-10-CM | POA: Diagnosis not present

## 2023-09-21 DIAGNOSIS — D225 Melanocytic nevi of trunk: Secondary | ICD-10-CM | POA: Diagnosis not present

## 2023-09-21 DIAGNOSIS — D2261 Melanocytic nevi of right upper limb, including shoulder: Secondary | ICD-10-CM | POA: Diagnosis not present

## 2023-10-23 ENCOUNTER — Other Ambulatory Visit: Payer: Self-pay | Admitting: Internal Medicine

## 2023-10-30 ENCOUNTER — Encounter: Payer: Self-pay | Admitting: Internal Medicine

## 2023-11-23 DIAGNOSIS — G4733 Obstructive sleep apnea (adult) (pediatric): Secondary | ICD-10-CM | POA: Diagnosis not present

## 2023-12-11 DIAGNOSIS — M7062 Trochanteric bursitis, left hip: Secondary | ICD-10-CM | POA: Diagnosis not present

## 2023-12-11 DIAGNOSIS — Z96641 Presence of right artificial hip joint: Secondary | ICD-10-CM | POA: Diagnosis not present

## 2023-12-11 DIAGNOSIS — M25561 Pain in right knee: Secondary | ICD-10-CM | POA: Diagnosis not present

## 2023-12-12 ENCOUNTER — Ambulatory Visit (INDEPENDENT_AMBULATORY_CARE_PROVIDER_SITE_OTHER)

## 2023-12-12 ENCOUNTER — Other Ambulatory Visit: Payer: Self-pay | Admitting: Internal Medicine

## 2023-12-12 ENCOUNTER — Other Ambulatory Visit (INDEPENDENT_AMBULATORY_CARE_PROVIDER_SITE_OTHER)

## 2023-12-12 DIAGNOSIS — E7800 Pure hypercholesterolemia, unspecified: Secondary | ICD-10-CM

## 2023-12-12 DIAGNOSIS — R7989 Other specified abnormal findings of blood chemistry: Secondary | ICD-10-CM | POA: Diagnosis not present

## 2023-12-12 DIAGNOSIS — R739 Hyperglycemia, unspecified: Secondary | ICD-10-CM

## 2023-12-12 LAB — CBC WITH DIFFERENTIAL/PLATELET
Basophils Absolute: 0 K/uL (ref 0.0–0.1)
Basophils Relative: 0.6 % (ref 0.0–3.0)
Eosinophils Absolute: 0.2 K/uL (ref 0.0–0.7)
Eosinophils Relative: 4.5 % (ref 0.0–5.0)
HCT: 37.3 % (ref 36.0–46.0)
Hemoglobin: 12.3 g/dL (ref 12.0–15.0)
Lymphocytes Relative: 48.6 % — ABNORMAL HIGH (ref 12.0–46.0)
Lymphs Abs: 2.4 K/uL (ref 0.7–4.0)
MCHC: 32.9 g/dL (ref 30.0–36.0)
MCV: 87.8 fl (ref 78.0–100.0)
Monocytes Absolute: 0.3 K/uL (ref 0.1–1.0)
Monocytes Relative: 6.5 % (ref 3.0–12.0)
Neutro Abs: 2 K/uL (ref 1.4–7.7)
Neutrophils Relative %: 39.8 % — ABNORMAL LOW (ref 43.0–77.0)
Platelets: 317 K/uL (ref 150.0–400.0)
RBC: 4.25 Mil/uL (ref 3.87–5.11)
RDW: 13.4 % (ref 11.5–15.5)
WBC: 5 K/uL (ref 4.0–10.5)

## 2023-12-12 LAB — LIPID PANEL
Cholesterol: 165 mg/dL (ref 0–200)
HDL: 47.1 mg/dL (ref >39.00)
LDL Cholesterol: 91 mg/dL (ref 0–99)
NonHDL: 117.5
Total CHOL/HDL Ratio: 3
Triglycerides: 134 mg/dL (ref 0.0–149.0)
VLDL: 26.8 mg/dL (ref 0.0–40.0)

## 2023-12-12 LAB — HEPATIC FUNCTION PANEL
ALT: 13 U/L (ref 0–35)
AST: 15 U/L (ref 0–37)
Albumin: 4.4 g/dL (ref 3.5–5.2)
Alkaline Phosphatase: 56 U/L (ref 39–117)
Bilirubin, Direct: 0.1 mg/dL (ref 0.0–0.3)
Total Bilirubin: 0.3 mg/dL (ref 0.2–1.2)
Total Protein: 6.8 g/dL (ref 6.0–8.3)

## 2023-12-12 LAB — BASIC METABOLIC PANEL WITH GFR
BUN: 20 mg/dL (ref 6–23)
CO2: 25 meq/L (ref 19–32)
Calcium: 10.1 mg/dL (ref 8.4–10.5)
Chloride: 106 meq/L (ref 96–112)
Creatinine, Ser: 0.72 mg/dL (ref 0.40–1.20)
GFR: 83.86 mL/min (ref >60.00)
Glucose, Bld: 105 mg/dL — ABNORMAL HIGH (ref 70–99)
Potassium: 4.4 meq/L (ref 3.5–5.1)
Sodium: 140 meq/L (ref 135–145)

## 2023-12-12 LAB — T4, FREE: Free T4: 0.88 ng/dL (ref 0.60–1.60)

## 2023-12-12 LAB — T3, FREE: T3, Free: 3.8 pg/mL (ref 2.3–4.2)

## 2023-12-12 LAB — TSH: TSH: 0.09 u[IU]/mL — ABNORMAL LOW (ref 0.35–5.50)

## 2023-12-12 LAB — HEMOGLOBIN A1C: Hgb A1c MFr Bld: 6.4 % (ref 4.6–6.5)

## 2023-12-12 NOTE — Progress Notes (Signed)
 Order placed for add on thyroid  labs.

## 2023-12-13 ENCOUNTER — Other Ambulatory Visit

## 2023-12-13 ENCOUNTER — Ambulatory Visit: Payer: Self-pay | Admitting: Internal Medicine

## 2023-12-15 ENCOUNTER — Ambulatory Visit: Admitting: Internal Medicine

## 2023-12-15 VITALS — BP 110/68 | HR 83 | Resp 16 | Ht 61.0 in | Wt 138.8 lb

## 2023-12-15 DIAGNOSIS — E7800 Pure hypercholesterolemia, unspecified: Secondary | ICD-10-CM

## 2023-12-15 DIAGNOSIS — F439 Reaction to severe stress, unspecified: Secondary | ICD-10-CM | POA: Diagnosis not present

## 2023-12-15 DIAGNOSIS — K219 Gastro-esophageal reflux disease without esophagitis: Secondary | ICD-10-CM | POA: Diagnosis not present

## 2023-12-15 DIAGNOSIS — Z713 Dietary counseling and surveillance: Secondary | ICD-10-CM | POA: Diagnosis not present

## 2023-12-15 DIAGNOSIS — G4733 Obstructive sleep apnea (adult) (pediatric): Secondary | ICD-10-CM | POA: Diagnosis not present

## 2023-12-15 DIAGNOSIS — F32A Depression, unspecified: Secondary | ICD-10-CM

## 2023-12-15 DIAGNOSIS — R739 Hyperglycemia, unspecified: Secondary | ICD-10-CM | POA: Diagnosis not present

## 2023-12-15 DIAGNOSIS — Z860100 Personal history of colon polyps, unspecified: Secondary | ICD-10-CM

## 2023-12-15 MED ORDER — SERTRALINE HCL 50 MG PO TABS: 75.0000 mg | ORAL_TABLET | Freq: Every day | ORAL | 1 refills | Status: DC

## 2023-12-15 MED ORDER — LOVASTATIN 10 MG PO TABS: ORAL_TABLET | ORAL | 1 refills | Status: DC

## 2023-12-15 NOTE — Progress Notes (Signed)
 Subjective:    Patient ID: Jessica Peck, female    DOB: 08/08/1950, 73 y.o.   MRN: [id number]  Patient here for  Chief Complaint  Patient presents with   Medical Management of Chronic Issues    HPI Here for a scheduled follow up.  Is s/p total hip arthroplasty 12/21/22. Had f/u with ortho 03/15/23 - no acute boney abnormality. Recommended to continue activities as tolerated. F/u one year.  remains on zoloft . Using cpap. Last visit, started lovastatin . Wearing cpap. Reports increased fatigue. Does not feel rested when wakes up in the am. She request f/u with Dr Jess. Would like to discuss sleep and cpap. No chest pain or sob reported. Discussed diet and exercise. Had discussed referral to [facility]. She did not qualify given her BMI. Request referral to lifestyles to meet with nutritionist/dietician. Taking celebrex - bursitis. Discussed celebrex and possible side effects.    Past Medical History:  Diagnosis Date   Chronic constipation    Esophagitis    Family history of breast cancer    Family history of colon cancer    Gastritis    EGD (hiatal hernia)   GERD (gastroesophageal reflux disease)    Hypercholesterolemia    Sleep apnea    CPAP   Vitamin D deficiency    Past Surgical History:  Procedure Laterality Date   APPENDECTOMY  1976   Family History  Problem Relation Age of Onset   Colon cancer Mother 61   COPD Mother    Heart disease Mother    Colon cancer Father        dx 4s   Breast cancer Sister 30       died age 43   Breast cancer Maternal Uncle        dx 34s   Breast cancer Paternal Aunt    Breast cancer Paternal Aunt    Social History   Socioeconomic History   Marital status: Married    Spouse name: Alm   Number of children: 2   Years of education: Not on file   Highest education level: Not on file  Occupational History   Occupation: retired Research scientist (medical)    Comment: substitue teaches occassionally  Tobacco Use    Smoking status: Former    Current packs/day: 0.00    Average packs/day: 0.1 packs/day for 1 year (0.1 ttl pk-yrs)    Types: Cigarettes    Start date: 2    Quit date: 1974    Years since quitting: 51.7   Smokeless tobacco: Never   Tobacco comments:    Smoked socially in college  Vaping Use   Vaping status: Never Used  Substance and Sexual Activity   Alcohol use: Yes    Alcohol/week: 3.0 standard drinks of alcohol    Types: 3 Glasses of wine per week    Comment: 1 glass of wine 3 times per week   Drug use: No   Sexual activity: Not on file  Other Topics Concern   Not on file  Social History Narrative   Not on file   Social Drivers of Health   Financial Resource Strain: Low Risk  (11/24/2022)   Overall Financial Resource Strain (CARDIA)    Difficulty of Paying Living Expenses: Not hard at all  Food Insecurity: No Food Insecurity (11/24/2022)   Hunger Vital Sign    Worried About Running Out of Food in the Last Year: Never true    Ran Out of Food in the Last  Year: Never true  Transportation Needs: No Transportation Needs (11/24/2022)   PRAPARE - Administrator, Civil Service (Medical): No    Lack of Transportation (Non-Medical): No  Physical Activity: Inactive (11/24/2022)   Exercise Vital Sign    Days of Exercise per Week: 0 days    Minutes of Exercise per Session: 0 min  Stress: No Stress Concern Present (11/24/2022)   [employer] of Occupational Health - Occupational Stress Questionnaire    Feeling of Stress : Not at all  Social Connections: Socially Integrated (11/24/2022)   Social Connection and Isolation Panel    Frequency of Communication with Friends and Family: More than three times a week    Frequency of Social Gatherings with Friends and Family: Once a week    Attends Religious Services: More than 4 times per year    Active Member of [employer] or Organizations: Yes    Attends Engineer, structural: More than 4 times per year    Marital  Status: Married     Review of Systems  Constitutional:  Negative for appetite change and unexpected weight change.  HENT:  Negative for congestion and sinus pressure.   Respiratory:  Negative for cough, chest tightness and shortness of breath.   Cardiovascular:  Negative for chest pain, palpitations and leg swelling.  Gastrointestinal:  Negative for abdominal pain, diarrhea, nausea and vomiting.  Genitourinary:  Negative for difficulty urinating and dysuria.  Musculoskeletal:  Negative for joint swelling and myalgias.  Skin:  Negative for color change and rash.  Neurological:  Negative for dizziness and headaches.  Psychiatric/Behavioral:  Negative for agitation and dysphoric mood.        Objective:     BP 110/68   Pulse 83   Resp 16   Ht 5' 1 (1.549 m)   Wt 138 lb 12.8 oz (63 kg)   LMP 06/04/2000   SpO2 98%   BMI 26.23 kg/m  Wt Readings from Last 3 Encounters:  12/15/23 138 lb 12.8 oz (63 kg)  08/08/23 139 lb 6.4 oz (63.2 kg)  04/10/23 141 lb (64 kg)    Physical Exam Vitals reviewed.  Constitutional:      General: She is not in acute distress.    Appearance: Normal appearance.  HENT:     Head: Normocephalic and atraumatic.     Right Ear: External ear normal.     Left Ear: External ear normal.     Mouth/Throat:     Pharynx: No oropharyngeal exudate or posterior oropharyngeal erythema.  Eyes:     General: No scleral icterus.       Right eye: No discharge.        Left eye: No discharge.     Conjunctiva/sclera: Conjunctivae normal.  Neck:     Thyroid : No thyromegaly.  Cardiovascular:     Rate and Rhythm: Normal rate and regular rhythm.  Pulmonary:     Effort: No respiratory distress.     Breath sounds: Normal breath sounds. No wheezing.  Abdominal:     General: Bowel sounds are normal.     Palpations: Abdomen is soft.     Tenderness: There is no abdominal tenderness.  Musculoskeletal:        General: No swelling or tenderness.     Cervical back: Neck  supple. No tenderness.  Lymphadenopathy:     Cervical: No cervical adenopathy.  Skin:    Findings: No erythema or rash.  Neurological:     Mental Status: She is  alert.  Psychiatric:        Mood and Affect: Mood normal.        Behavior: Behavior normal.         Outpatient Encounter Medications as of 12/15/2023  Medication Sig   CELEBREX 200 MG capsule Take 200 mg by mouth daily.   cyanocobalamin (VITAMIN B12) 1000 MCG tablet Take 1,000 mcg by mouth daily.   docusate sodium (COLACE) 100 MG capsule Take 1 capsule twice a day by oral route.   lovastatin  (MEVACOR ) 10 MG tablet Take one tablet q Monday, Wednesday and Friday.   melatonin 5 MG TABS Take 5 mg by mouth at bedtime.   pantoprazole  (PROTONIX ) 20 MG tablet TAKE 1 TO 2 TABS BY MOUTH EVERY DAY   sertraline  (ZOLOFT ) 50 MG tablet Take 1.5 tablets (75 mg total) by mouth daily.   VITAMIN D, CHOLECALCIFEROL, PO Take 1,000 Units by mouth daily.   [DISCONTINUED] lovastatin  (MEVACOR ) 10 MG tablet Take one tablet q Monday, Wednesday and Friday.   [DISCONTINUED] sertraline  (ZOLOFT ) 50 MG tablet Take 1.5 tablets (75 mg total) by mouth daily.   No facility-administered encounter medications on file as of 12/15/2023.     Lab Results  Component Value Date   WBC 5.0 12/12/2023   HGB 12.3 12/12/2023   HCT 37.3 12/12/2023   PLT 317.0 12/12/2023   GLUCOSE 105 (H) 12/12/2023   CHOL 165 12/12/2023   TRIG 134.0 12/12/2023   HDL 47.10 12/12/2023   LDLDIRECT 140.0 09/21/2022   LDLCALC 91 12/12/2023   ALT 13 12/12/2023   AST 15 12/12/2023   NA 140 12/12/2023   K 4.4 12/12/2023   CL 106 12/12/2023   CREATININE 0.72 12/12/2023   BUN 20 12/12/2023   CO2 25 12/12/2023   TSH 0.09 (L) 12/12/2023   HGBA1C 6.4 12/12/2023    MM 3D SCREENING MAMMOGRAM BILATERAL BREAST Result Date: 03/15/2023 CLINICAL DATA:  Screening. EXAM: DIGITAL SCREENING BILATERAL MAMMOGRAM WITH TOMOSYNTHESIS AND CAD TECHNIQUE: Bilateral screening digital craniocaudal and  mediolateral oblique mammograms were obtained. Bilateral screening digital breast tomosynthesis was performed. The images were evaluated with computer-aided detection. COMPARISON:  Previous exam(s). ACR Breast Density Category b: There are scattered areas of fibroglandular density. FINDINGS: There are no findings suspicious for malignancy. IMPRESSION: No mammographic evidence of malignancy. A result letter of this screening mammogram will be mailed directly to the patient. RECOMMENDATION: Screening mammogram in one year. (Code:SM-B-01Y) BI-RADS CATEGORY  1: Negative. Electronically Signed   By: Alm Parkins M.D.   On: 03/15/2023 16:18   DG Bone Density Result Date: 03/14/2023 EXAM: DUAL X-RAY ABSORPTIOMETRY (DXA) FOR BONE MINERAL DENSITY IMPRESSION: Your patient Marien Manship completed a BMD test on 03/14/2023 using the [employer] iDXA DXA System (software version: 14.10) manufactured by [employer]. The following summarizes the results of our evaluation. Technologist:VLM PATIENT BIOGRAPHICAL: Name: Eleah, Lahaie Patient ID: [id number] Birth Date: April 03, 1950 Height: 60.0 in. Gender: Female Exam Date: 03/14/2023 Weight: 137.0 lbs. Indications: Caucasian, Hip Replacement Right Fractures: Treatments: Vitamin D DENSITOMETRY RESULTS: Site         Region     Measured Date Measured Age WHO Classification Young Adult T-score BMD         %Change vs. Previous Significant Change (*) Left Femur Neck 03/14/2023 71.1 Osteopenia -2.0 0.757 g/cm2 - - Left Forearm Radius 33% 03/14/2023 71.1 Normal -1.0 0.787 g/cm2 - - ASSESSMENT: The BMD measured at Femur Neck is 0.757 g/cm2 with a T-score of -2.0. This patient is considered  osteopenic according to World Health Organization [facility]) criteria. Lumbar spine was not utilized due to advanced degenerative changes. The right femur was not utilized due to surgical hardware. The scan quality is good. World Science writer [facility]) criteria for post-menopausal, Caucasian  Women: Normal:                   T-score at or above -1 SD Osteopenia/low bone mass: T-score between -1 and -2.5 SD Osteoporosis:             T-score at or below -2.5 SD RECOMMENDATIONS: 1. All patients should optimize calcium  and vitamin D intake. 2. Consider FDA-approved medical therapies in postmenopausal women and men aged 22 years and older, based on the following: a. A hip or vertebral(clinical or morphometric) fracture b. T-score < -2.5 at the femoral neck or spine after appropriate evaluation to exclude secondary causes c. Low bone mass (T-score between -1.0 and -2.5 at the femoral neck or spine) and a 10-year probability of a hip fracture > 3% or a 10-year probability of a major osteoporosis-related fracture > 20% based on the [place] -adapted WHO algorithm 3. Clinician judgment and/or patient preferences may indicate treatment for people with 10-year fracture probabilities above or below these levels FOLLOW-UP: People with diagnosed cases of osteoporosis or at high risk for fracture should have regular bone mineral density tests. For patients eligible for Medicare, routine testing is allowed once every 2 years. The testing frequency can be increased to one year for patients who have rapidly progressing disease, those who are receiving or discontinuing medical therapy to restore bone mass, or have additional risk factors. I have reviewed this report, and agree with the above findings. [facility] Dear ALLENA HAMILTON, Your patient WILMOTH RASNIC completed a FRAX assessment on 03/14/2023 using the [employer] iDXA DXA System (analysis version: 14.10) manufactured by [employer]. The following summarizes the results of our evaluation. PATIENT BIOGRAPHICAL: Name: Shine, Scrogham Patient ID: [id number] Birth Date: 01-26-1951 Height:    60.0 in. Gender:     Female    [age]       Weight:    137.0 lbs. Ethnicity:  White                            Exam Date: 03/14/2023 FRAX* RESULTS:  (version:  3.5) 10-year Probability of Fracture1 Major Osteoporotic Fracture2 Hip Fracture 12.4% 2.6% Population: [place]  (Caucasian) Risk Factors: None Based on Femur (Left) Neck BMD 1 -The 10-year probability of fracture may be lower than reported if the patient has received treatment. 2 -Major Osteoporotic Fracture: Clinical Spine, Forearm, Hip or Shoulder *FRAX is a Armed forces logistics/support/administrative officer of the [employer] of [employer] for Metabolic Bone Disease, a World Science writer (WHO) [employer]. ASSESSMENT: The probability of a major osteoporotic fracture is 12.4% within the next ten years. The probability of a hip fracture is 2.6% within the next ten years. Electronically Signed   By: Rosaline Collet M.D.   On: 03/14/2023 11:20       Assessment & Plan:  Pure hypercholesterolemia Assessment & Plan: The 10-year ASCVD risk score (Arnett DK, et al., 2019) is: 7.7%   Values used to calculate the score:     Age: 80 years     Clincally relevant sex: Female     Is Non-Hispanic African American: No     Diabetic: No     Tobacco  smoker: No     Systolic Blood Pressure: 110 mmHg     Is BP treated: No     HDL Cholesterol: 47.1 mg/dL     Total Cholesterol: 165 mg/dL  Low cholesterol diet and exercise.  Started lovastatin  last visit. Tolerating. Cholesterol improved. Follow llpid panel.   Orders: -     Hepatic function panel; Future -     Basic metabolic panel with GFR; Future -     Lipid panel; Future  Hyperglycemia Assessment & Plan: Low carb diet and exercise. Request lifestyles referral. Recent A1c 6.4.   Orders: -     Hemoglobin A1c; Future -     Amb Referral to Nutrition and Diabetic Education  Depression, unspecified depression type Assessment & Plan: Coninues on zoloft . Overall appears to be handling things relatively well. Follow.    Encounter for weight loss counseling Assessment & Plan: Did not qualify for [facility]. Request referral to  lifestyles for diet education.    Gastroesophageal reflux disease, unspecified whether esophagitis present Assessment & Plan: Continue protonix . No upper symptoms reported.    Stress Assessment & Plan: Continues on zoloft . Overall appears to be handling things relatively well. Follow.    Obstructive sleep apnea Assessment & Plan: Has seen pulmonary. Using cpap. Reports increased fatigue. Does not feel rested. Request referral to Dr Jess - help with sleep and f/u cpap.   Orders: -     Pulmonary Visit  History of colonic polyps Assessment & Plan: Colonoscopy 12/13/19 - diverticulosis and internal hemorrhoids.    Other orders -     Lovastatin ; Take one tablet q Monday, Wednesday and Friday.  Dispense: 39 tablet; Refill: 1 -     Sertraline  HCl; Take 1.5 tablets (75 mg total) by mouth daily.  Dispense: 135 tablet; Refill: 1     Allena Hamilton, MD

## 2023-12-16 ENCOUNTER — Encounter: Payer: Self-pay | Admitting: Internal Medicine

## 2023-12-16 NOTE — Assessment & Plan Note (Signed)
 The 10-year ASCVD risk score (Arnett DK, et al., 2019) is: 7.7%   Values used to calculate the score:     Age: 73 years     Clincally relevant sex: Female     Is Non-Hispanic African American: No     Diabetic: No     Tobacco smoker: No     Systolic Blood Pressure: 110 mmHg     Is BP treated: No     HDL Cholesterol: 47.1 mg/dL     Total Cholesterol: 165 mg/dL  Low cholesterol diet and exercise.  Started lovastatin  last visit. Tolerating. Cholesterol improved. Follow llpid panel.

## 2023-12-16 NOTE — Assessment & Plan Note (Signed)
 Coninues on zoloft . Overall appears to be handling things relatively well. Follow.

## 2023-12-16 NOTE — Assessment & Plan Note (Signed)
Colonoscopy 12/13/19 - diverticulosis and internal hemorrhoids.  

## 2023-12-16 NOTE — Assessment & Plan Note (Signed)
 Low carb diet and exercise. Request lifestyles referral. Recent A1c 6.4.

## 2023-12-16 NOTE — Assessment & Plan Note (Signed)
 [facility] not qualify [facility]. Request referral to lifestyles for diet education.

## 2023-12-16 NOTE — Assessment & Plan Note (Signed)
Continue protonix.  No upper symptoms reported.

## 2023-12-16 NOTE — Assessment & Plan Note (Signed)
 Has seen pulmonary. Using cpap. Reports increased fatigue. Does not feel rested. Request referral to Dr Jess - help with sleep and f/u cpap.

## 2023-12-16 NOTE — Assessment & Plan Note (Signed)
Continues on zoloft.  Overall appears to be handling things relatively well.  Follow.

## 2023-12-18 ENCOUNTER — Ambulatory Visit

## 2023-12-27 ENCOUNTER — Ambulatory Visit: Admitting: Sleep Medicine

## 2023-12-27 ENCOUNTER — Encounter: Payer: Self-pay | Admitting: Sleep Medicine

## 2023-12-27 VITALS — BP 110/80 | HR 69 | Temp 98.0°F | Ht 60.0 in | Wt 138.0 lb

## 2023-12-27 DIAGNOSIS — G4710 Hypersomnia, unspecified: Secondary | ICD-10-CM

## 2023-12-27 DIAGNOSIS — G4733 Obstructive sleep apnea (adult) (pediatric): Secondary | ICD-10-CM

## 2023-12-27 NOTE — Patient Instructions (Signed)

## 2023-12-27 NOTE — Progress Notes (Signed)
 Name:Jessica Peck MRN: [id number] DOB: [birth year]   CHIEF COMPLAINT:  EXCESSIVE DAYTIME SLEEPINESS   HISTORY OF PRESENT ILLNESS: Jessica Peck is a 73 y.o. w/ a h/o OSA, GERD, hyperlipidemia, anxiety and depression who presents to reestablish care for OSA. Reports using CPAP therapy every night, which is confirmed by compliance data. Reports excessive daytime sleepiness despite using CPAP therapy nightly and is getting adequate sleep. She is currently using the Airfit N20 nasal mask, which is causing skin irritation. Reports occasional nocturnal awakenings due to nocturia, however does not have difficulty falling back to sleep. Reports a 15 lb weight gain over the last few years. Denies morning headaches, RLS symptoms, dream enactment, cataplexy, hypnagogic or hypnapompic hallucinations. Denies a family history of sleep apnea. Denies drowsy driving. Drinks 1-2 cups of coffee and 1 soda daily, drinks 1 glass of wine nightly, former smoker, denies illicit drug use.   Bedtime 11-11:30 pm Sleep onset 10 mins Rise time 8-9 am   EPWORTH SLEEP SCORE     12/04/2020   11:58 AM 10/28/2019   11:00 AM  Results of the Epworth flowsheet  Sitting and reading 1 1  Watching TV 0 1  Sitting, inactive in a public place (e.g. a theatre or a meeting) 1 0  As a passenger in a car for an hour without a break 1 1  Lying down to rest in the afternoon when circumstances permit 2 2  Sitting and talking to someone 0 0  Sitting quietly after a lunch without alcohol 1 1  In a car, while stopped for a few minutes in traffic 0 0  Total score 6 6      PAST MEDICAL HISTORY :   has a past medical history of Chronic constipation, Esophagitis, Family history of breast cancer, Family history of colon cancer, Gastritis, GERD (gastroesophageal reflux disease), Hypercholesterolemia, Sleep apnea, and Vitamin D deficiency.  has a past surgical history that includes Appendectomy (03/28/1974) and Total hip  arthroplasty (Right, 2024). Prior to Admission medications   Medication Sig Start Date End Date Taking? Authorizing Provider  CELEBREX 200 MG capsule Take 200 mg by mouth daily. 12/11/23  Yes [provider]  cyanocobalamin (VITAMIN B12) 1000 MCG tablet Take 1,000 mcg by mouth daily.   Yes [provider]  docusate sodium (COLACE) 100 MG capsule Take 1 capsule twice a day by oral route. 12/13/22  Yes [provider]  lovastatin  (MEVACOR ) 10 MG tablet Take one tablet q Monday, Wednesday and Friday. 12/15/23  Yes Glendia Shad, MD  melatonin 5 MG TABS Take 5 mg by mouth at bedtime.   Yes [provider]  pantoprazole  (PROTONIX ) 20 MG tablet TAKE 1 TO 2 TABS BY MOUTH EVERY DAY 10/23/23  Yes Glendia Shad, MD  sertraline  (ZOLOFT ) 50 MG tablet Take 1.5 tablets (75 mg total) by mouth daily. 12/15/23  Yes Glendia Shad, MD  VITAMIN D, CHOLECALCIFEROL, PO Take 1,000 Units by mouth daily.   Yes [provider]   Allergies  Allergen Reactions   Penicillins Swelling and Dermatitis    FAMILY HISTORY:  family history includes Breast cancer in her maternal uncle, paternal aunt, and paternal aunt; Breast cancer (age of onset: 5) in her sister; COPD in her mother; Colon cancer in her father; Colon cancer (age of onset: 46) in her mother; Heart disease in her mother. SOCIAL HISTORY:  reports that she quit smoking about 51 years ago. Her smoking use included cigarettes. She started smoking  about 52 years ago. She has a 0.1 pack-year smoking history. She has never used smokeless tobacco. She reports current alcohol use of about 3.0 standard drinks of alcohol per week. She reports that she does not use drugs.   Review of Systems:  Gen:  Denies  fever, sweats, chills weight loss  HEENT: Denies blurred vision, double vision, ear pain, eye pain, hearing loss, nose bleeds, sore throat Cardiac:  No dizziness, chest pain or heaviness, chest tightness,edema, No  JVD Resp:   No cough, -sputum production, -shortness of breath,-wheezing, -hemoptysis,  Gi: Denies swallowing difficulty, stomach pain, nausea or vomiting, diarrhea, constipation, bowel incontinence Gu:  Denies bladder incontinence, burning urine Ext:   Denies Joint pain, stiffness or swelling Skin: Denies  skin rash, easy bruising or bleeding or hives Endoc:  Denies polyuria, polydipsia , polyphagia or weight change Psych:   Denies depression, insomnia or hallucinations  Other:  All other systems negative  VITAL SIGNS: BP 110/80   Pulse 69   Temp 98 F (36.7 C)   Ht 5' (1.524 m)   Wt 138 lb (62.6 kg)   LMP 06/04/2000   SpO2 95%   BMI 26.95 kg/m    Physical Examination:   General Appearance: No distress  EYES PERRLA, EOM intact.   NECK Supple, No JVD Pulmonary: normal breath sounds, No wheezing.  CardiovascularNormal S1,S2.  No m/r/g.   Abdomen: Benign, Soft, non-tender. Skin:   warm, no rashes, no ecchymosis  Extremities: normal, no cyanosis, clubbing. Neuro:without focal findings,  speech normal  PSYCHIATRIC: Mood, affect within normal limits.   ASSESSMENT AND PLAN  OSA Patient is using and benefiting from CPAP therapy. For mask discomfort, will try patient on the A[employer] N30i nasal mask. Discussed the consequences of untreated sleep apnea. Advised not to drive drowsy for safety of patient and others. Will follow up in 3 months.    Hypersomnia I suspect that residual sleepiness is secondary to taking Melatonin at bedtime. Advised patient to avoid melatonin for a few days to see if symptoms improve.    Patient  satisfied with Plan of action and management. All questions answered  I spent a total of 48 minutes reviewing chart data, face-to-face evaluation with the patient, counseling and coordination of care as detailed above.    Joleah Kosak, M.D.  Sleep Medicine [facility]

## 2024-01-22 ENCOUNTER — Other Ambulatory Visit

## 2024-01-23 ENCOUNTER — Other Ambulatory Visit: Payer: Self-pay

## 2024-01-23 ENCOUNTER — Ambulatory Visit: Admitting: Internal Medicine

## 2024-01-23 ENCOUNTER — Encounter: Payer: Self-pay | Admitting: Internal Medicine

## 2024-01-23 ENCOUNTER — Other Ambulatory Visit

## 2024-01-23 VITALS — BP 120/80 | HR 71 | Ht 60.0 in | Wt 140.1 lb

## 2024-01-23 DIAGNOSIS — Z800 Family history of malignant neoplasm of digestive organs: Secondary | ICD-10-CM

## 2024-01-23 DIAGNOSIS — E7800 Pure hypercholesterolemia, unspecified: Secondary | ICD-10-CM

## 2024-01-23 DIAGNOSIS — R112 Nausea with vomiting, unspecified: Secondary | ICD-10-CM | POA: Diagnosis not present

## 2024-01-23 DIAGNOSIS — R197 Diarrhea, unspecified: Secondary | ICD-10-CM | POA: Diagnosis not present

## 2024-01-23 DIAGNOSIS — R1010 Upper abdominal pain, unspecified: Secondary | ICD-10-CM | POA: Diagnosis not present

## 2024-01-23 DIAGNOSIS — R7989 Other specified abnormal findings of blood chemistry: Secondary | ICD-10-CM | POA: Diagnosis not present

## 2024-01-23 DIAGNOSIS — R110 Nausea: Secondary | ICD-10-CM | POA: Insufficient documentation

## 2024-01-23 LAB — T3, FREE: T3, Free: 3 pg/mL (ref 2.3–4.2)

## 2024-01-23 LAB — TSH: TSH: 9.73 u[IU]/mL — ABNORMAL HIGH (ref 0.35–5.50)

## 2024-01-23 LAB — T4, FREE: Free T4: 0.4 ng/dL — ABNORMAL LOW (ref 0.60–1.60)

## 2024-01-23 NOTE — Progress Notes (Signed)
 Patient ID: Jessica Peck, female   DOB: Sep 17, 1950, 73 y.o.   MRN: [id number] HPI:  Jessica Peck is a 73 year old female with a GI history of GERD, gallbladder disease status postcholecystectomy, remote colon polyps, family history of colon cancer in both her mother and father, diverticulosis who presents with episodes of severe nausea, vomiting, and diarrhea.  She experiences episodes of severe nausea, vomiting, and diarrhea, occurring already 4-5 times this year. These episodes begin with nausea, followed by upper abdominal cramping, vomiting, and then diarrhea. Each episode leaves her feeling 'wiped out' for a couple of days. She typically occurs in the evening after dinner and lasts several hours. She has been to the emergency room three times in the last five to six years for these symptoms, where she received fluids and was discharged. She carries Zofran  for nausea, but it does not effectively prevent the episodes.  Her medical history includes gallbladder removal in 2017; she reported having attacks at that time, and no stones were found. She also had an appendectomy. Her bowel habits are irregular, with bowel movements every three to four days, sometimes extending to a week. She has not noticed a correlation between constipation and the episodes.  Her family history is significant for colon cancer in both parents, breast cancer in her sister, and heart disease in her brother. She had a colonoscopy in September 2021, which showed sigmoid diverticulosis and small internal hemorrhoids. She recalls being told she might have diverticulitis years ago.  She consumes one glass of wine every night and denies any use of THC or marijuana. She has gained weight recently and does not lose weight due to these episodes. She has no known thyroid  disease, although recent lab work showed a low TSH with normal T3 and T4 levels.  Current medications include pantoprazole  20 mg, Zoloft , lovastatin , vitamin D,  B12, and Celebrex. She uses Zofran  for nausea but finds it ineffective in preventing the episodes.  No fever or bloating during episodes. Nausea and vomiting occur before diarrhea. Irregular bowel habits with movements every three to four days.        Past Medical History:  Diagnosis Date   Chronic constipation    Esophagitis    Family history of breast cancer    Family history of colon cancer    Gastritis    EGD (hiatal hernia)   GERD (gastroesophageal reflux disease)    Hypercholesterolemia    Sleep apnea    CPAP   Vitamin D deficiency     Past Surgical History:  Procedure Laterality Date   APPENDECTOMY  03/28/1974   CHOLECYSTECTOMY     TOTAL HIP ARTHROPLASTY Right 2024    Outpatient Medications Prior to Visit  Medication Sig Dispense Refill   CELEBREX 200 MG capsule Take 200 mg by mouth daily.     cyanocobalamin (VITAMIN B12) 1000 MCG tablet Take 1,000 mcg by mouth daily.     lovastatin  (MEVACOR ) 10 MG tablet Take one tablet q Monday, Wednesday and Friday. 39 tablet 1   melatonin 5 MG TABS Take 5 mg by mouth at bedtime.     pantoprazole  (PROTONIX ) 20 MG tablet TAKE 1 TO 2 TABS BY MOUTH EVERY DAY 180 tablet 1   sertraline  (ZOLOFT ) 50 MG tablet Take 1.5 tablets (75 mg total) by mouth daily. 135 tablet 1   VITAMIN D, CHOLECALCIFEROL, PO Take 1,000 Units by mouth daily.     docusate sodium (COLACE) 100 MG capsule Take 1 capsule twice a  day by oral route. (Patient not taking: Reported on 01/23/2024)     No facility-administered medications prior to visit.    Allergies  Allergen Reactions   Penicillins Swelling and Dermatitis    Family History  Problem Relation Age of Onset   Colon cancer Mother 67   COPD Mother    Heart disease Mother    Colon cancer Father        dx 26s   Breast cancer Sister 70       died age 67   Breast cancer Maternal Uncle        dx 1s   Breast cancer Paternal Aunt    Breast cancer Paternal Aunt     Social History   Tobacco Use    Smoking status: Former    Current packs/day: 0.00    Average packs/day: 0.1 packs/day for 1 year (0.1 ttl pk-yrs)    Types: Cigarettes    Start date: 58    Quit date: 1974    Years since quitting: 51.8   Smokeless tobacco: Never   Tobacco comments:    Smoked socially in college  Vaping Use   Vaping status: Never Used  Substance Use Topics   Alcohol use: Yes    Alcohol/week: 3.0 standard drinks of alcohol    Types: 3 Glasses of wine per week    Comment: 1 glass of wine 3 times per week   Drug use: No    ROS: As per history of present illness, otherwise negative  BP 120/80   Pulse 71   Ht 5' (1.524 m)   Wt 140 lb 2 oz (63.6 kg)   LMP 06/04/2000   BMI 27.37 kg/m  Gen: awake, alert, NAD HEENT: anicteric  Abd: soft, NT/ND, +BS throughout Ext: no c/c/e Neuro: nonfocal   RELEVANT LABS AND IMAGING:  Results   LABS AST: 15 (12/12/2023) Total bilirubin: 0.3 (12/12/2023) ALK-FOS: 56 (12/12/2023) Albumin: 4.4 (12/12/2023) Hemoglobin A1c: 6.4 (12/12/2023) BUN: 20 (12/12/2023) Creatinine: 0.72 (12/12/2023) TSH: 0 (12/12/2023) Free T3: 3.8 (12/12/2023) Free T4: 0.88 (12/12/2023) Hemoglobin: 12.3 (12/12/2023) MCV: 87.8 (12/12/2023) WBC: 5.0 (12/12/2023) PLT: 317 (12/12/2023)  RADIOLOGY Barium esophagram: Unremarkable  DIAGNOSTIC Colonoscopy: Sigmoid diverticulosis, small internal hemorrhoids, otherwise normal (12/13/2019) EGD: Small hiatus hernia, diffuse moderate inflammation with erosions, erythema and granularity in gastric antrum (12/13/2011)  PATHOLOGY GE junction biopsy: Squamous columnar junction with mild chronic inflammation, no intestinal metaplasia or Barrett's esophagus (12/13/2011) Gastric antral biopsy: Reactive gastropathy, no Helicobacter pylori (12/13/2011)      ASSESSMENT/PLAN:  Recurrent episodes of upper abdominal pain, nausea, vomiting, and diarrhea Recurrent severe nausea, vomiting, and diarrhea, with episodes increasing in frequency.  Differential includes cyclic vomiting syndrome, PUD, retained CBD stone. Normal liver enzymes currently, but post-attack testing may reveal abnormalities. - Order MRI of the abdomen with MRCP. - Schedule upper endoscopy. - Prescribe dissolvable ondansetron  4 mg, up to two tablets as needed for nausea every 8 hours.  Constipation Infrequent bowel movements every three to four days. - Consider Miralax if further evaluation does not reveal a cause for symptoms.  Sigmoid diverticulosis Identified in previous colonoscopy. No current symptoms of diverticulitis. Avoidance of seeds is unnecessary.  Family history of colon cancer Family history in both parents. Last colonoscopy in 2021 showed sigmoid diverticulosis and small internal hemorrhoids, no polyps. - Schedule colonoscopy for September 2026.       Rr:Drnuu, [place], Md [address] [place],  [place] [address]

## 2024-01-23 NOTE — Patient Instructions (Signed)
 You have been scheduled for an MRI/MRCP at [facility] radiology on 02/01/24. Your appointment time is 8:30am. Please arrive to admitting (at main entrance of the hospital) 30 minutes prior to your appointment time for registration purposes. Please make certain not to have anything to eat or drink 6 hours prior to your test. In addition, if you have any metal in your body, have a pacemaker or defibrillator, please be sure to let your ordering physician know. This test typically takes 45 minutes to 1 hour to complete. Should you need to reschedule, please call [phone] to do so.  You have been scheduled for an endoscopy. Please follow written instructions given to you at your visit today.  If you use inhalers (even only as needed), please bring them with you on the day of your procedure.  If you take any of the following medications, they will need to be adjusted prior to your procedure:   DO NOT TAKE 7 DAYS PRIOR TO TEST- Trulicity (dulaglutide) Ozempic, Wegovy (semaglutide) Mounjaro (tirzepatide) Bydureon Bcise (exanatide extended release)  DO NOT TAKE 1 DAY PRIOR TO YOUR TEST Rybelsus (semaglutide) Adlyxin (lixisenatide) Victoza (liraglutide) Byetta (exanatide) ___________________________________________________________________________  _______________________________________________________  If your blood pressure at your visit was 140/90 or greater, please contact your primary care physician to follow up on this.  _______________________________________________________  If you are age 2 or older, your body mass index should be between 23-30. Your Body mass index is 27.37 kg/m. If this is out of the aforementioned range listed, please consider follow up with your Primary Care Provider.  If you are age 29 or younger, your body mass index should be between 19-25. Your Body mass index is 27.37 kg/m. If this is out of the aformentioned range listed, please consider follow up with  your Primary Care Provider.   ________________________________________________________  The [facility] providers would like to encourage you to use MYCHART to communicate with providers for non-urgent requests or questions.  Due to long hold times on the telephone, sending your provider a message by [facility] may be a faster and more efficient way to get a response.  Please allow 48 business hours for a response.  Please remember that this is for non-urgent requests.  _______________________________________________________  [facility] is using a team-based approach to care.  Your team is made up of your doctor and two to three APPS. Our APPS (Nurse Practitioners and Physician Assistants) work with your physician to ensure care continuity for you. They are fully qualified to address your health concerns and develop a treatment plan. They communicate directly with your gastroenterologist to care for you. Seeing the Advanced Practice Practitioners on your physician's team can help you by facilitating care more promptly, often allowing for earlier appointments, access to diagnostic testing, procedures, and other specialty referrals.

## 2024-01-25 ENCOUNTER — Ambulatory Visit: Admitting: Internal Medicine

## 2024-01-25 ENCOUNTER — Encounter: Payer: Self-pay | Admitting: Internal Medicine

## 2024-01-25 ENCOUNTER — Ambulatory Visit: Payer: Self-pay | Admitting: Internal Medicine

## 2024-01-25 VITALS — BP 116/66 | HR 67 | Temp 97.4°F | Resp 18 | Ht 60.0 in | Wt 140.0 lb

## 2024-01-25 DIAGNOSIS — K2100 Gastro-esophageal reflux disease with esophagitis, without bleeding: Secondary | ICD-10-CM | POA: Diagnosis not present

## 2024-01-25 DIAGNOSIS — R1010 Upper abdominal pain, unspecified: Secondary | ICD-10-CM | POA: Diagnosis not present

## 2024-01-25 DIAGNOSIS — K317 Polyp of stomach and duodenum: Secondary | ICD-10-CM

## 2024-01-25 DIAGNOSIS — K209 Esophagitis, unspecified without bleeding: Secondary | ICD-10-CM | POA: Diagnosis not present

## 2024-01-25 DIAGNOSIS — R112 Nausea with vomiting, unspecified: Secondary | ICD-10-CM | POA: Diagnosis not present

## 2024-01-25 DIAGNOSIS — K449 Diaphragmatic hernia without obstruction or gangrene: Secondary | ICD-10-CM | POA: Diagnosis not present

## 2024-01-25 MED ORDER — SODIUM CHLORIDE 0.9 % IV SOLN: 500.0000 mL | Freq: Once | INTRAVENOUS | Status: DC

## 2024-01-25 MED ORDER — PANTOPRAZOLE SODIUM 40 MG PO TBEC: 40.0000 mg | DELAYED_RELEASE_TABLET | Freq: Every day | ORAL | 3 refills | Status: AC

## 2024-01-25 NOTE — Progress Notes (Signed)
 To PACU via stretcher, sedated, good respiratory effort, VSS.

## 2024-01-25 NOTE — Progress Notes (Signed)
 Called to room to assist during endoscopic procedure.  Patient ID and intended procedure confirmed with present staff. Received instructions for my participation in the procedure from the performing physician.

## 2024-01-25 NOTE — Op Note (Signed)
 [facility] Patient Name: Jessica Peck Procedure Date: 01/25/2024 10:30 AM MRN: [id number] Endoscopist: Gordy CHRISTELLA Starch , MD, [id number] Age: 73 Referring MD:  Date of Birth: Jan 26, 1951 Gender: Female Account #: [id number] Procedure:                Upper GI endoscopy Indications:              Epigastric abdominal pain, Gastro-esophageal reflux                            disease, Episodic nausea with vomiting, currently                            on pantoprazole  20 mg daily Medicines:                Monitored Anesthesia Care Procedure:                Pre-Anesthesia Assessment:                           - Prior to the procedure, a History and Physical                            was performed, and patient medications and                            allergies were reviewed. The patient's tolerance of                            previous anesthesia was also reviewed. The risks                            and benefits of the procedure and the sedation                            options and risks were discussed with the patient.                            All questions were answered, and informed consent                            was obtained. Prior Anticoagulants: The patient has                            taken no anticoagulant or antiplatelet agents. ASA                            Grade Assessment: II - A patient with mild systemic                            disease. After reviewing the risks and benefits,                            the patient was deemed in satisfactory condition to  undergo the procedure.                           After obtaining informed consent, the endoscope was                            passed under direct vision. Throughout the                            procedure, the patient's blood pressure, pulse, and                            oxygen saturations were monitored continuously. The                            Olympus Scope  [id number] was introduced through the                            mouth, and advanced to the second part of duodenum.                            The upper GI endoscopy was accomplished without                            difficulty. The patient tolerated the procedure                            well. Scope In: Scope Out: Findings:                 LA Grade A (one or more mucosal breaks less than 5                            mm, not extending between tops of 2 mucosal folds)                            esophagitis with no bleeding was found at the                            gastroesophageal junction.                           A 2 cm hiatal hernia was present.                           Multiple small sessile polyps were found in the                            cardia, in the gastric fundus and in the gastric                            body. Several were biopsied with a cold forceps for  histology (as a representative sample).                           The exam of the stomach was otherwise normal.                           Biopsies were taken with a cold forceps in the                            gastric body, at the incisura and in the gastric                            antrum for Helicobacter pylori testing.                           The examined duodenum was normal. Biopsies for                            histology were taken with a cold forceps for                            evaluation of celiac disease. Complications:            No immediate complications. Estimated Blood Loss:     Estimated blood loss was minimal. Impression:               - LA Grade A reflux esophagitis with no bleeding.                           - 2 cm hiatal hernia.                           - Multiple gastric polyps. Biopsied. Most                            consistent with benign fundic gland polyps.                           - Normal examined duodenum. Biopsied.                           -  Biopsies were taken with a cold forceps for                            Helicobacter pylori testing. Recommendation:           - Patient has a contact number available for                            emergencies. The signs and symptoms of potential                            delayed complications were discussed with the                            patient. Return  to normal activities tomorrow.                            Written discharge instructions were provided to the                            patient.                           - Resume previous diet.                           - Continue present medications.                           - Given esophagitis increase pantoprazole  to 40 mg                            once daily.                           - Await pathology results.                           - Await MRI/MCRP results. Gordy CHRISTELLA Starch, MD 01/25/2024 11:01:17 AM This report has been signed electronically.

## 2024-01-25 NOTE — Progress Notes (Signed)
 See office note this week for details and current H&P  Patient presenting for upper endoscopy to evaluate intermittent nausea, vomiting and upper abdominal pain  She remains appropriate for [facility] EGD today.

## 2024-01-25 NOTE — Patient Instructions (Signed)
 Increase Pantoprazole  to 40 mg once daily. Awaiting pathology results, MRI/MCRP  YOU HAD AN ENDOSCOPIC PROCEDURE TODAY AT THE [facility]:   Refer to the procedure report that was given to you for any specific questions about what was found during the examination.  If the procedure report does not answer your questions, please call your gastroenterologist to clarify.  If you requested that your care partner not be given the details of your procedure findings, then the procedure report has been included in a sealed envelope for you to review at your convenience later.  YOU SHOULD EXPECT: Some feelings of bloating in the abdomen. Passage of more gas than usual.  Walking can help get rid of the air that was put into your GI tract during the procedure and reduce the bloating. If you had a lower endoscopy (such as a colonoscopy or flexible sigmoidoscopy) you may notice spotting of blood in your stool or on the toilet paper. If you underwent a bowel prep for your procedure, you may not have a normal bowel movement for a few days.  Please Note:  You might notice some irritation and congestion in your nose or some drainage.  This is from the oxygen used during your procedure.  There is no need for concern and it should clear up in a day or so.  SYMPTOMS TO REPORT IMMEDIATELY:  Following upper endoscopy (EGD)  Vomiting of blood or coffee ground material  New chest pain or pain under the shoulder blades  Painful or persistently difficult swallowing  New shortness of breath  Fever of 100F or higher  Black, tarry-looking stools  For urgent or emergent issues, a gastroenterologist can be reached at any hour by calling [phone]. Do not use MyChart messaging for urgent concerns.    DIET:  We do recommend a small meal at first, but then you may proceed to your regular diet.  Drink plenty of fluids but you should avoid alcoholic beverages for 24 hours.  ACTIVITY:  You should plan to take  it easy for the rest of today and you should NOT DRIVE or use heavy machinery until tomorrow (because of the sedation medicines used during the test).    FOLLOW UP: Our staff will call the number listed on your records the next business day following your procedure.  We will call around 7:15- 8:00 am to check on you and address any questions or concerns that you may have regarding the information given to you following your procedure. If we do not reach you, we will leave a message.     If any biopsies were taken you will be contacted by phone or by letter within the next 1-3 weeks.  Please call us  at [phone] if you have not heard about the biopsies in 3 weeks.    SIGNATURES/CONFIDENTIALITY: You and/or your care partner have signed paperwork which will be entered into your electronic medical record.  These signatures attest to the fact that that the information above on your After Visit Summary has been reviewed and is understood.  Full responsibility of the confidentiality of this discharge information lies with you and/or your care-partner.

## 2024-01-26 ENCOUNTER — Other Ambulatory Visit: Payer: Self-pay | Admitting: Internal Medicine

## 2024-01-26 ENCOUNTER — Other Ambulatory Visit: Payer: Self-pay

## 2024-01-26 ENCOUNTER — Telehealth: Payer: Self-pay

## 2024-01-26 DIAGNOSIS — R7989 Other specified abnormal findings of blood chemistry: Secondary | ICD-10-CM

## 2024-01-26 DIAGNOSIS — E039 Hypothyroidism, unspecified: Secondary | ICD-10-CM

## 2024-01-26 MED ORDER — LEVOTHYROXINE SODIUM 50 MCG PO TABS
50.0000 ug | ORAL_TABLET | Freq: Every day | ORAL | 3 refills | Status: DC
Start: 2024-01-26 — End: 2024-02-13

## 2024-01-26 NOTE — Telephone Encounter (Signed)
  Follow up Call-     01/25/2024   10:08 AM  Call back number  Post procedure Call Back phone  # [phone]  Permission to leave phone message Yes     Patient questions:  Do you have a fever, pain , or abdominal swelling? No. Pain Score  0 *  Have you tolerated food without any problems? Yes.    Have you been able to return to your normal activities? Yes.    Do you have any questions about your discharge instructions: Diet   No. Medications  No. Follow up visit  No.  Do you have questions or concerns about your Care? No.  Actions: * If pain score is 4 or above: No action needed, pain <4.

## 2024-01-26 NOTE — Progress Notes (Signed)
 Order placed for future tsh.

## 2024-01-29 LAB — SURGICAL PATHOLOGY

## 2024-01-31 ENCOUNTER — Ambulatory Visit: Payer: Self-pay | Admitting: Internal Medicine

## 2024-01-31 ENCOUNTER — Ambulatory Visit

## 2024-02-01 ENCOUNTER — Ambulatory Visit (HOSPITAL_COMMUNITY)

## 2024-02-11 ENCOUNTER — Encounter: Payer: Self-pay | Admitting: Internal Medicine

## 2024-02-12 ENCOUNTER — Other Ambulatory Visit: Payer: Self-pay | Admitting: Internal Medicine

## 2024-02-12 ENCOUNTER — Ambulatory Visit
Admission: RE | Admit: 2024-02-12 | Discharge: 2024-02-12 | Disposition: A | Discharge status: Home / Self Care | Source: Ambulatory Visit | Attending: Internal Medicine | Admitting: Internal Medicine

## 2024-02-12 ENCOUNTER — Ambulatory Visit: Payer: Self-pay

## 2024-02-12 DIAGNOSIS — R1010 Upper abdominal pain, unspecified: Secondary | ICD-10-CM

## 2024-02-12 DIAGNOSIS — Z800 Family history of malignant neoplasm of digestive organs: Secondary | ICD-10-CM | POA: Diagnosis not present

## 2024-02-12 DIAGNOSIS — R197 Diarrhea, unspecified: Secondary | ICD-10-CM | POA: Insufficient documentation

## 2024-02-12 DIAGNOSIS — Z9049 Acquired absence of other specified parts of digestive tract: Secondary | ICD-10-CM | POA: Diagnosis not present

## 2024-02-12 DIAGNOSIS — K760 Fatty (change of) liver, not elsewhere classified: Secondary | ICD-10-CM | POA: Diagnosis not present

## 2024-02-12 DIAGNOSIS — R112 Nausea with vomiting, unspecified: Secondary | ICD-10-CM

## 2024-02-12 MED ORDER — GADOBUTROL 1 MMOL/ML IV SOLN
6.0000 mL | Freq: Once | INTRAVENOUS | Status: AC | PRN
  Administered 2024-02-12: 6 mL via INTRAVENOUS

## 2024-02-12 NOTE — Telephone Encounter (Signed)
 Pt called and scheduled an appt. Triage note was sent to doc of day

## 2024-02-12 NOTE — Telephone Encounter (Signed)
 Lvm for pt to return call. Pt needs to be triaged and offered an appointment

## 2024-02-12 NOTE — Telephone Encounter (Signed)
 FYI Only or Action Required?: FYI only for provider: appointment scheduled on 02/13/24.  Patient was last seen in primary care on 12/15/2023 by Glendia Shad, MD.  Called Nurse Triage reporting Diarrhea.  Symptoms began several weeks ago.  Interventions attempted: Other: stopped levothyroxine.  Symptoms are: gradually improving.  Triage Disposition: See Physician Within 24 Hours  Patient/caregiver understands and will follow disposition?: Yes    Copied from CRM #[id number]. Topic: Clinical - Red Word Triage >> Feb 12, 2024 10:19 AM Avram MATSU wrote: Red Word that prompted transfer to Nurse Triage: a lot of diarrhea and was told to be NT Reason for Disposition  [1] MODERATE diarrhea (e.g., 4-6 times / day more than normal) AND [2] age > 70 years  Answer Assessment - Initial Assessment Questions Additional info: Patient sent message to pcp via MyChart, she was advised to call for triage and schedule an appointment.  Patient shares she developed diarrhea soon after starting levothyroxine, she stopped medication for several days for a trip and diarrhea resolved, when she returned from trip she restarted Levothyroxine and loose stool recurred, she has since stopped Levothyroxine and requesting appointment to evaluated diarrhea and discuss thyroid  medication. Next available with pcp is not until February 2026, scheduled acute with alternate provider to evaluated diarrhea.    1. DIARRHEA SEVERITY: How bad is the diarrhea? How many more stools have you had in the past 24 hours than normal?       Varies but increased  2. ONSET: When did the diarrhea begin?       Several days  3. STOOL DESCRIPTION:  How loose or watery is the diarrhea? What is the stool color? Is there any blood or mucous in the stool?     Loose  4. VOMITING: Are you also vomiting? If Yes, ask: How many times in the past 24 hours?      Denies  5. ABDOMEN PAIN: Are you having any abdomen pain? If Yes, ask:  What does it feel like? (e.g., crampy, dull, intermittent, constant)      Denies  6. ABDOMEN PAIN SEVERITY: If present, ask: How bad is the pain?  (e.g., Scale 1-10; mild, moderate, or severe)     0/10 7. ORAL INTAKE: If vomiting, Have you been able to drink liquids? How much liquids have you had in the past 24 hours?     Not vomiting 8. HYDRATION: Any signs of dehydration? (e.g., dry mouth [not just dry lips], too weak to stand, dizziness, new weight loss) When did you last urinate?     hydrated 9. EXPOSURE: Have you traveled to a foreign country recently? Have you been exposed to anyone with diarrhea? Could you have eaten any food that was spoiled?     Denies  10. ANTIBIOTIC USE: Are you taking antibiotics now or have you taken antibiotics in the past 2 months?       d 11. OTHER SYMPTOMS: Do you have any other symptoms? (e.g., fever, blood in stool)       Denies all other symptoms  Protocols used: Diarrhea-A-AH

## 2024-02-13 ENCOUNTER — Encounter: Payer: Self-pay | Admitting: Internal Medicine

## 2024-02-13 ENCOUNTER — Encounter: Payer: Self-pay | Admitting: Family

## 2024-02-13 ENCOUNTER — Ambulatory Visit: Admitting: Family

## 2024-02-13 VITALS — BP 136/78 | HR 98 | Temp 97.7°F | Ht 60.0 in | Wt 138.0 lb

## 2024-02-13 DIAGNOSIS — T50905A Adverse effect of unspecified drugs, medicaments and biological substances, initial encounter: Secondary | ICD-10-CM

## 2024-02-13 DIAGNOSIS — E039 Hypothyroidism, unspecified: Secondary | ICD-10-CM

## 2024-02-13 DIAGNOSIS — R197 Diarrhea, unspecified: Secondary | ICD-10-CM

## 2024-02-13 LAB — TSH: TSH: 6.55 u[IU]/mL — ABNORMAL HIGH (ref 0.35–5.50)

## 2024-02-13 MED ORDER — THYROID 30 MG PO TABS: 30.0000 mg | ORAL_TABLET | Freq: Every day | ORAL | 1 refills | Status: DC

## 2024-02-13 NOTE — Progress Notes (Signed)
 Acute Office Visit  Subjective:     Patient ID: Jessica Peck, female    DOB: [birth year], 73 y.o.   MRN: [id number]  Chief Complaint  Patient presents with  . Diarrhea    2 weeks since she began taking synthroid     HPI Patient is in today with complaints of diarrhea that began after she started taking Synthroid  50 mcg for hypothyroidism.  She was having 4-5 episodes per day that was not resolving.  Denied any blood in her stools.  She has since stopped taking the medication for 5 days and the diarrhea has resolved.  Review of Systems  Constitutional: Negative.   HENT: Negative.    Respiratory: Negative.    Cardiovascular: Negative.   Gastrointestinal:  Positive for diarrhea.  Genitourinary: Negative.   Musculoskeletal: Negative.   Neurological: Negative.   Psychiatric/Behavioral: Negative.    All other systems reviewed and are negative.   Past Medical History:  Diagnosis Date  . Arthritis   . Chronic constipation   . Depression   . Esophagitis   . Family history of breast cancer   . Family history of colon cancer   . Gastritis    EGD (hiatal hernia)  . GERD (gastroesophageal reflux disease)   . Hypercholesterolemia   . Sleep apnea    CPAP  . Vitamin D deficiency     Social History   Socioeconomic History  . Marital status: Married    Spouse name: Alm  . Number of children: 2  . Years of education: Not on file  . Highest education level: Not on file  Occupational History  . 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  Tobacco Use  . Smoking status: Former    Current packs/day: 0.00    Average packs/day: 0.1 packs/day for 1 year (0.1 ttl pk-yrs)    Types: Cigarettes    Start date: 45    Quit date: 54    Years since quitting: 51.9  . Smokeless tobacco: Never  . Tobacco comments:    Smoked socially in college  Vaping Use  . Vaping status: Never Used  Substance and Sexual Activity  . Alcohol use: Yes     Alcohol/week: 3.0 standard drinks of alcohol    Types: 3 Glasses of wine per week    Comment: 1 glass of wine 3 times per week  . Drug use: No  . Sexual activity: Not on file  Other Topics Concern  . Not on file  Social History Narrative  . Not on file   Social Drivers of Health   Financial Resource Strain: Low Risk  (11/24/2022)   Overall Financial Resource Strain (CARDIA)   . Difficulty of Paying Living Expenses: Not hard at all  Food Insecurity: No Food Insecurity (11/24/2022)   Hunger Vital Sign   . Worried About Programme Researcher, Broadcasting/film/video in the Last Year: Never true   . Ran Out of Food in the Last Year: Never true  Transportation Needs: No Transportation Needs (11/24/2022)   PRAPARE - Transportation   . Lack of Transportation (Medical): No   . Lack of Transportation (Non-Medical): No  Physical Activity: Inactive (11/24/2022)   Exercise Vital Sign   . Days of Exercise per Week: 0 days   . Minutes of Exercise per Session: 0 min  Stress: No Stress Concern Present (11/24/2022)   [employer] of Occupational Health - Occupational Stress Questionnaire   . Feeling of Stress : Not at all  Social Connections: Socially Integrated (11/24/2022)   Social Connection and Isolation Panel   . Frequency of Communication with Friends and Family: More than three times a week   . Frequency of Social Gatherings with Friends and Family: Once a week   . Attends Religious Services: More than 4 times per year   . Active Member of Clubs or Organizations: Yes   . Attends Banker Meetings: More than 4 times per year   . Marital Status: Married  Catering Manager Violence: Not At Risk (11/24/2022)   Humiliation, Afraid, Rape, and Kick questionnaire   . Fear of Current or Ex-Partner: No   . Emotionally Abused: No   . Physically Abused: No   . Sexually Abused: No    Past Surgical History:  Procedure Laterality Date  . APPENDECTOMY  03/28/1974  . CHOLECYSTECTOMY    . TOTAL HIP ARTHROPLASTY  Right 2024    Family History  Problem Relation Age of Onset  . Colon cancer Mother 51  . COPD Mother   . Heart disease Mother   . Colon cancer Father        dx 104s  . Breast cancer Sister 29       died age 67  . Breast cancer Maternal Uncle        dx 52s  . Breast cancer Paternal Aunt   . Breast cancer Paternal Aunt     Allergies  Allergen Reactions  . Penicillins Swelling and Dermatitis    Current Outpatient Medications on File Prior to Visit  Medication Sig Dispense Refill  . CELEBREX 200 MG capsule Take 200 mg by mouth daily.    . cyanocobalamin (VITAMIN B12) 1000 MCG tablet Take 1,000 mcg by mouth daily.    . lovastatin  (MEVACOR ) 10 MG tablet Take one tablet q Monday, Wednesday and Friday. 39 tablet 1  . melatonin 5 MG TABS Take 5 mg by mouth at bedtime.    . pantoprazole  (PROTONIX ) 40 MG tablet Take 1 tablet (40 mg total) by mouth daily. 90 tablet 3  . sertraline  (ZOLOFT ) 50 MG tablet Take 1.5 tablets (75 mg total) by mouth daily. 135 tablet 1  . VITAMIN D, CHOLECALCIFEROL, PO Take 1,000 Units by mouth daily.    SABRA docusate sodium (COLACE) 100 MG capsule Take 1 capsule twice a day by oral route. (Patient not taking: Reported on 02/13/2024)     No current facility-administered medications on file prior to visit.    BP 136/78   Pulse 98   Temp 97.7 F (36.5 C) (Oral)   Ht 5' (1.524 m)   Wt 138 lb (62.6 kg)   LMP 06/04/2000   SpO2 95%   BMI 26.95 kg/m chart     Objective:    BP 136/78   Pulse 98   Temp 97.7 F (36.5 C) (Oral)   Ht 5' (1.524 m)   Wt 138 lb (62.6 kg)   LMP 06/04/2000   SpO2 95%   BMI 26.95 kg/m    Physical Exam Vitals reviewed.  Constitutional:      Appearance: Normal appearance. She is normal weight.  Cardiovascular:     Rate and Rhythm: Normal rate and regular rhythm.  Pulmonary:     Effort: Pulmonary effort is normal.     Breath sounds: Normal breath sounds.  Abdominal:     General: Abdomen is flat. Bowel sounds are normal.      Palpations: Abdomen is soft.  Musculoskeletal:  General: Normal range of motion.     Cervical back: Normal range of motion and neck supple.  Skin:    General: Skin is warm and dry.  Neurological:     General: No focal deficit present.     Mental Status: She is alert and oriented to person, place, and time. Mental status is at baseline.  Psychiatric:        Mood and Affect: Mood normal.        Behavior: Behavior normal.        Thought Content: Thought content normal.    No results found for any visits on 02/13/24.      Assessment & Plan:   Problem List Items Addressed This Visit   None Visit Diagnoses       Hypothyroidism, unspecified type    -  Primary   Relevant Medications   thyroid  (NP THYROID ) 30 MG tablet   Other Relevant Orders   TSH       Meds ordered this encounter  Medications  . thyroid  (NP THYROID ) 30 MG tablet    Sig: Take 1 tablet (30 mg total) by mouth daily before breakfast.    Dispense:  30 tablet    Refill:  1   Call the office if symptoms worsen or persist.  Recheck in 6 weeks and sooner as needed. Return in about 6 weeks (around 03/26/2024) for lab appt.  Keith Cancio B Ahtziry Saathoff, FNP

## 2024-02-13 NOTE — Telephone Encounter (Signed)
 I was not in office 02/12/24. Reviewed message. Has appt today.

## 2024-02-14 ENCOUNTER — Ambulatory Visit: Payer: Self-pay | Admitting: Internal Medicine

## 2024-02-14 ENCOUNTER — Ambulatory Visit: Payer: Self-pay | Admitting: Family

## 2024-02-18 ENCOUNTER — Encounter: Payer: Self-pay | Admitting: Internal Medicine

## 2024-02-19 NOTE — Telephone Encounter (Signed)
 If having pulsatile tinnitus, recommend evaluation today. Want to make sure nothing more acute going on and then we can f/u after.

## 2024-02-19 NOTE — Telephone Encounter (Signed)
 Spoke with pt , pt doesn't want to go to urgent care due to traveling -- pt wants to be seen upon return -- scheduled for 02/29/24 with Rollene Northern, NP

## 2024-02-26 MED ORDER — ONDANSETRON 4 MG PO TBDP: 4.0000 mg | ORAL_TABLET | Freq: Three times a day (TID) | ORAL | 0 refills | Status: DC | PRN

## 2024-02-26 NOTE — Telephone Encounter (Signed)
 Ondansetron  ODT 4 mg 1 to 2 tablets every 8 hours as needed nausea or vomiting; maximum dose 24 mg in any 24-hour period.

## 2024-02-26 NOTE — Telephone Encounter (Signed)
 Rx sent.

## 2024-02-29 ENCOUNTER — Ambulatory Visit: Admitting: Family

## 2024-02-29 ENCOUNTER — Encounter: Payer: Self-pay | Admitting: Family

## 2024-02-29 VITALS — BP 130/70 | HR 72 | Temp 98.0°F | Ht 61.0 in | Wt 140.6 lb

## 2024-02-29 DIAGNOSIS — Z1231 Encounter for screening mammogram for malignant neoplasm of breast: Secondary | ICD-10-CM | POA: Diagnosis not present

## 2024-02-29 DIAGNOSIS — H9201 Otalgia, right ear: Secondary | ICD-10-CM | POA: Diagnosis not present

## 2024-02-29 DIAGNOSIS — K219 Gastro-esophageal reflux disease without esophagitis: Secondary | ICD-10-CM | POA: Diagnosis not present

## 2024-02-29 MED ORDER — ONDANSETRON 4 MG PO TBDP: 4.0000 mg | ORAL_TABLET | Freq: Three times a day (TID) | ORAL | 0 refills | Status: AC | PRN

## 2024-02-29 NOTE — Progress Notes (Signed)
 Assessment & Plan:  Right ear pain Assessment & Plan: Pulsatile sensation has resolved.  Interestingly, symptom started after MRI abdomen.  Reassuring HEENT, neurologic exam.  Absence of carotid bruits.  Patient I discussed pursuing MRI brain, MRA head, we jointly agreed to defer at this time unless symptom recurs.  Pending referral to [facility] due to associated hearing loss.  Advise she may try over-the-counter antihistamine if symptoms were to recur as well.  Orders: -     Ambulatory referral to ENT  Encounter for screening mammogram for malignant neoplasm of breast -     3D Screening Mammogram, Left and Right; Future  Gastroesophageal reflux disease, unspecified whether esophagitis present -     Ondansetron ; Take 1-2 tablets (4-8 mg total) by mouth every 8 (eight) hours as needed for nausea or vomiting. Max dose of 24 mg per 24 hour period  Dispense: 30 tablet; Refill: 0     Return precautions given.   Risks, benefits, and alternatives of the medications and treatment plan prescribed today were discussed, and patient expressed understanding.   Education regarding symptom management and diagnosis given to patient on AVS either electronically or printed.  Return if symptoms worsen or fail to improve.  Rollene Northern, FNP  Subjective:    Patient ID: Jessica Peck, female    DOB: 1950/10/27, 73 y.o.   MRN: [id number]  CC: Jessica Peck is a 73 y.o. female who presents today for an acute visit.    HPI: HPI Discussed the use of AI scribe software for clinical note transcription with the patient, who gave verbal consent to proceed.  History of Present Illness   Jessica Peck is a 73 year old female who presents with right ear pulsatile sensation and hearing loss.  She experiences a pounding sensation in her right ear, described as 'almost like my heartbeat,' which began after an MRI of the abdomen on February 13, 2024. The sensation persisted for about a week and  has since resolved. No associated neurologic symptoms such as headache or vision changes, and no history of tinnitus or recent exposure to loud noises. She has not experienced any recent air travel, sinus infections, or congestion.  She has noticed a general decline in hearing, which she feels may warrant a hearing test. She primarily uses her right ear for phone calls but cannot differentiate if the hearing loss is one-sided. She has not taken any medications for the ear symptoms and is not currently on any antihistamines.  Her past medical history includes the use of a CPAP machine for sleep apnea and recent changes in thyroid  medication from Synthroid  to NP thyroid  due to gastrointestinal issues with the former.  Family history is significant for breast cancer, with her sister and two aunts having had the disease.      History of OSA, hyperglycemia, GERD No CKD A1c 6.4 12/12/23 MRCP 02/13/24  Allergies: Penicillins Current Outpatient Medications on File Prior to Visit  Medication Sig Dispense Refill   CELEBREX 200 MG capsule Take 200 mg by mouth daily.     cyanocobalamin (VITAMIN B12) 1000 MCG tablet Take 1,000 mcg by mouth daily.     lovastatin  (MEVACOR ) 10 MG tablet Take one tablet q Monday, Wednesday and Friday. 39 tablet 1   melatonin 5 MG TABS Take 5 mg by mouth at bedtime.     pantoprazole  (PROTONIX ) 40 MG tablet Take 1 tablet (40 mg total) by mouth daily. 90 tablet 3   sertraline  (ZOLOFT ) 50  MG tablet Take 1.5 tablets (75 mg total) by mouth daily. 135 tablet 1   thyroid  (NP THYROID ) 30 MG tablet Take 1 tablet (30 mg total) by mouth daily before breakfast. 30 tablet 1   VITAMIN D, CHOLECALCIFEROL, PO Take 1,000 Units by mouth daily.     No current facility-administered medications on file prior to visit.    Review of Systems  Constitutional:  Negative for chills and fever.  HENT:  Positive for hearing loss. Negative for congestion and sinus pressure.   Respiratory:  Negative  for cough.   Cardiovascular:  Negative for chest pain and palpitations.  Gastrointestinal:  Negative for nausea and vomiting.  Neurological:  Negative for dizziness.      Objective:    BP 130/70   Pulse 72   Temp 98 F (36.7 C) (Oral)   Ht 5' 1 (1.549 m)   Wt 140 lb 9.6 oz (63.8 kg)   LMP 06/04/2000   SpO2 95%   BMI 26.57 kg/m   BP Readings from Last 3 Encounters:  02/29/24 130/70  02/13/24 136/78  01/25/24 116/66   Wt Readings from Last 3 Encounters:  02/29/24 140 lb 9.6 oz (63.8 kg)  02/13/24 138 lb (62.6 kg)  01/25/24 140 lb (63.5 kg)    Physical Exam Vitals reviewed.  Constitutional:      Appearance: She is well-developed.  HENT:     Head: Normocephalic and atraumatic.     Right Ear: Hearing, tympanic membrane, ear canal and external ear normal. No swelling or tenderness. No middle ear effusion. Tympanic membrane is not erythematous or bulging.     Left Ear: Tympanic membrane, ear canal and external ear normal. No swelling or tenderness.  No middle ear effusion. Tympanic membrane is not erythematous or bulging.     Nose: Nose normal. No rhinorrhea.     Right Sinus: No maxillary sinus tenderness or frontal sinus tenderness.     Left Sinus: No maxillary sinus tenderness or frontal sinus tenderness.     Mouth/Throat:     Pharynx: Uvula midline. No posterior oropharyngeal erythema.  Eyes:     General: Lids are normal. Lids are everted, no foreign bodies appreciated.     Conjunctiva/sclera: Conjunctivae normal.     Pupils: Pupils are equal, round, and reactive to light.     Comments: Normal fundus bilaterally   Neck:     Vascular: No carotid bruit.  Cardiovascular:     Rate and Rhythm: Normal rate and regular rhythm.     Pulses: Normal pulses.     Heart sounds: Normal heart sounds.  Pulmonary:     Effort: Pulmonary effort is normal.     Breath sounds: Normal breath sounds. No wheezing, rhonchi or rales.  Lymphadenopathy:     Head:     Right side of head: No  submental, submandibular, tonsillar, preauricular, posterior auricular or occipital adenopathy.     Left side of head: No submental, submandibular, tonsillar, preauricular, posterior auricular or occipital adenopathy.     Cervical: No cervical adenopathy.     Right cervical: No superficial, deep or posterior cervical adenopathy.    Left cervical: No superficial, deep or posterior cervical adenopathy.  Skin:    General: Skin is warm and dry.  Neurological:     Mental Status: She is alert.     Cranial Nerves: No cranial nerve deficit.     Sensory: No sensory deficit.     Deep Tendon Reflexes:     Reflex Scores:  Bicep reflexes are 2+ on the right side and 2+ on the left side.      Patellar reflexes are 2+ on the right side and 2+ on the left side.    Comments: Grip equal and strong bilateral upper extremities. Gait strong and steady. Able to perform  finger-to-nose without difficulty.   Psychiatric:        Speech: Speech normal.        Behavior: Behavior normal.        Thought Content: Thought content normal.

## 2024-02-29 NOTE — Patient Instructions (Addendum)
 Consider MRI brain and MRA head if pulsatile sensation recurs Meantime, reasonable to trial OTC antihistamine, claritin, zyrtec  Referral to [facility] [address] [place] [place], [place] [address]  Phone: [phone]  Please call  and schedule your 3D mammogram and /or bone density scan as we discussed.   [facility]  ( new location in 2023)  [address], [place], [place] [address]  [place], [place]  [phone]

## 2024-02-29 NOTE — Assessment & Plan Note (Signed)
 Pulsatile sensation has resolved.  Interestingly, symptom started after MRI abdomen.  Reassuring HEENT, neurologic exam.  Absence of carotid bruits.  Patient I discussed pursuing MRI brain, MRA head, we jointly agreed to defer at this time unless symptom recurs.  Pending referral to [facility] due to associated hearing loss.  Advise she may try over-the-counter antihistamine if symptoms were to recur as well.

## 2024-03-08 ENCOUNTER — Other Ambulatory Visit

## 2024-03-29 ENCOUNTER — Other Ambulatory Visit (INDEPENDENT_AMBULATORY_CARE_PROVIDER_SITE_OTHER)

## 2024-03-29 DIAGNOSIS — E039 Hypothyroidism, unspecified: Secondary | ICD-10-CM | POA: Diagnosis not present

## 2024-03-29 LAB — TSH: TSH: 5.21 u[IU]/mL (ref 0.35–5.50)

## 2024-03-31 ENCOUNTER — Ambulatory Visit: Payer: Self-pay | Admitting: Internal Medicine

## 2024-04-08 ENCOUNTER — Ambulatory Visit: Admitting: *Deleted

## 2024-04-08 VITALS — Ht 61.0 in | Wt 140.0 lb

## 2024-04-08 DIAGNOSIS — Z0000 Encounter for general adult medical examination without abnormal findings: Secondary | ICD-10-CM

## 2024-04-08 NOTE — Patient Instructions (Signed)
 Ms. Karpf,  Thank you for taking the time for your Medicare Wellness Visit. I appreciate your continued commitment to your health goals. Please review the care plan we discussed, and feel free to reach out if I can assist you further.  Please note that Annual Wellness Visits do not include a physical exam. Some assessments may be limited, especially if the visit was conducted virtually. If needed, we may recommend an in-person follow-up with your provider.  Ongoing Care Seeing your primary care provider every 3 to 6 months helps us  monitor your health and provide consistent, personalized care.  Remember to call and schedule your mammogram that was ordered 02/29/24.  Referrals If a referral was made during today's visit and you haven't received any updates within two weeks, please contact the referred provider directly to check on the status.  Recommended Screenings:  Health Maintenance  Topic Date Due   COVID-19 Vaccine (6 - 2025-26 season) 11/27/2023   Breast Cancer Screening  03/13/2024   DTaP/Tdap/Td vaccine (2 - Tdap) 08/07/2024*   Colon Cancer Screening  12/12/2024   Osteoporosis screening with Bone Density Scan  03/13/2025   Medicare Annual Wellness Visit  04/08/2025   Pneumococcal Vaccine for age over 14  Completed   Flu Shot  Completed   Hepatitis C Screening  Completed   Zoster (Shingles) Vaccine  Completed   Meningitis B Vaccine  Aged Out   Hepatitis B Vaccine  Discontinued  *Topic was postponed. The date shown is not the original due date.       04/08/2024    2:59 PM  Advanced Directives  Does Patient Have a Medical Advance Directive? Yes  Type of Estate Agent of [place];Living will  Does patient want to make changes to medical advance directive? No - Patient declined  Copy of Healthcare Power of Attorney in Chart? No - copy requested    Vision: Annual vision screenings are recommended for early detection of glaucoma, cataracts, and  diabetic retinopathy. These exams can also reveal signs of chronic conditions such as diabetes and high blood pressure.  Dental: Annual dental screenings help detect early signs of oral cancer, gum disease, and other conditions linked to overall health, including heart disease and diabetes.  Please see the attached documents for additional preventive care recommendations.

## 2024-04-08 NOTE — Progress Notes (Signed)
 "  Chief Complaint  Patient presents with   Medicare Wellness     Subjective:   Jessica Peck is a 74 y.o. female who presents for a Medicare Annual Wellness Visit.  Visit info / Clinical Intake: Medicare Wellness Visit Type:: Subsequent Annual Wellness Visit Persons participating in visit and providing information:: patient Medicare Wellness Visit Mode:: Video Since this visit was completed virtually, some vitals may be partially provided or unavailable. Missing vitals are due to the limitations of the virtual format.: Unable to obtain vitals - no equipment If Telephone or Video please confirm:: I connected with patient using audio/video enable telemedicine. I verified patient identity with two identifiers, discussed telehealth limitations, and patient agreed to proceed. Patient Location:: Home Provider Location:: Office/Home Interpreter Needed?: No Pre-visit prep was completed: yes AWV questionnaire completed by patient prior to visit?: no Living arrangements:: lives with spouse/significant other Patient's Overall Health Status Rating: very good Typical amount of pain: none Does pain affect daily life?: no Are you currently prescribed opioids?: no  Dietary Habits and Nutritional Risks How many meals a day?: 2 Eats fruit and vegetables daily?: yes Most meals are obtained by: preparing own meals In the last 2 weeks, have you had any of the following?: none Diabetic:: no  Functional Status Activities of Daily Living (to include ambulation/medication): Independent Ambulation: Independent Medication Administration: Independent Home Management (perform basic housework or laundry): Independent Manage your own finances?: yes Primary transportation is: driving Concerns about vision?: no *vision screening is required for WTM* Concerns about hearing?: (!) yes (has an appointment scheduled with ENT) Uses hearing aids?: no Hear whispered voice?: -- (televisit)  Fall  Screening Falls in the past year?: 0 Number of falls in past year: 0 Was there an injury with Fall?: 0 Fall Risk Category Calculator: 0 Patient Fall Risk Level: Low Fall Risk  Fall Risk Patient at Risk for Falls Due to: No Fall Risks Fall risk Follow up: Falls evaluation completed; Falls prevention discussed  Home and Transportation Safety: All rugs have non-skid backing?: yes All stairs or steps have railings?: N/A, no stairs Grab bars in the bathtub or shower?: yes Have non-skid surface in bathtub or shower?: yes Good home lighting?: yes Regular seat belt use?: yes Hospital stays in the last year:: no  Cognitive Assessment Difficulty concentrating, remembering, or making decisions? : no Will 6CIT or Mini Cog be Completed: yes What year is it?: 0 points What month is it?: 0 points Give patient an address phrase to remember (5 components): [address] [place] About what time is it?: 0 points Count backwards from 20 to 1: 0 points Say the months of the year in reverse: 0 points Repeat the address phrase from earlier: 0 points 6 CIT Score: 0 points  Advance Directives (For Healthcare) Does Patient Have a Medical Advance Directive?: Yes Does patient want to make changes to medical advance directive?: No - Patient declined Type of Advance Directive: Healthcare Power of [place]; Living will Copy of Healthcare Power of Attorney in Chart?: No - copy requested Copy of Living Will in Chart?: No - copy requested  Reviewed/Updated  Reviewed/Updated: Reviewed All (Medical, Surgical, Family, Medications, Allergies, Care Teams, Patient Goals)    Allergies (verified) Penicillins   Current Medications (verified) Outpatient Encounter Medications as of 04/08/2024  Medication Sig   CELEBREX 200 MG capsule Take 200 mg by mouth daily. (Patient taking differently: Take 200 mg by mouth daily as needed.)   cyanocobalamin  (VITAMIN B12) 1000 MCG tablet Take  1,000 mcg by mouth daily.    lovastatin  (MEVACOR ) 10 MG tablet Take one tablet q Monday, Wednesday and Friday.   melatonin 5 MG TABS Take 5 mg by mouth at bedtime.   ondansetron  (ZOFRAN -ODT) 4 MG disintegrating tablet Take 1-2 tablets (4-8 mg total) by mouth every 8 (eight) hours as needed for nausea or vomiting. Max dose of 24 mg per 24 hour period   pantoprazole  (PROTONIX ) 40 MG tablet Take 1 tablet (40 mg total) by mouth daily.   sertraline  (ZOLOFT ) 50 MG tablet Take 1.5 tablets (75 mg total) by mouth daily.   thyroid  (NP THYROID ) 30 MG tablet Take 1 tablet (30 mg total) by mouth daily before breakfast.   VITAMIN D, CHOLECALCIFEROL, PO Take 1,000 Units by mouth daily.   No facility-administered encounter medications on file as of 04/08/2024.    History: Past Medical History:  Diagnosis Date   Arthritis    Chronic constipation    Depression    Esophagitis    Family history of breast cancer    Family history of colon cancer    Gastritis    EGD (hiatal hernia)   GERD (gastroesophageal reflux disease)    Hypercholesterolemia    Sleep apnea    CPAP   Vitamin D deficiency    Past Surgical History:  Procedure Laterality Date   APPENDECTOMY  03/28/1974   CHOLECYSTECTOMY     TOTAL HIP ARTHROPLASTY Right 2024   Family History  Problem Relation Age of Onset   Colon cancer Mother 22   COPD Mother    Heart disease Mother    Colon cancer Father        dx 17s   Breast cancer Sister 30       died age 41   Breast cancer Maternal Uncle        dx 3s   Breast cancer Paternal Aunt    Breast cancer Paternal Aunt    Social History   Occupational History   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  Tobacco Use   Smoking status: Former    Current packs/day: 0.00    Average packs/day: 0.1 packs/day for 1 year (0.1 ttl pk-yrs)    Types: Cigarettes    Start date: 89    Quit date: 1974    Years since quitting: 52.0   Smokeless tobacco: Never   Tobacco comments:     Smoked socially in college  Vaping Use   Vaping status: Never Used  Substance and Sexual Activity   Alcohol use: Yes    Alcohol/week: 3.0 standard drinks of alcohol    Types: 3 Glasses of wine per week    Comment: 1 glass of wine 3 times per week   Drug use: No   Sexual activity: Not on file   Tobacco Counseling Counseling given: Not Answered Tobacco comments: Smoked socially in college  SDOH Screenings   Food Insecurity: No Food Insecurity (04/08/2024)  Housing: Low Risk (04/08/2024)  Transportation Needs: No Transportation Needs (04/08/2024)  Utilities: Not At Risk (04/08/2024)  Alcohol Screen: Low Risk (11/24/2022)  Depression (PHQ2-9): Low Risk (04/08/2024)  Financial Resource Strain: Low Risk (04/08/2024)  Physical Activity: Insufficiently Active (04/08/2024)  Social Connections: Socially Integrated (04/08/2024)  Stress: No Stress Concern Present (04/08/2024)  Tobacco Use: Medium Risk (04/08/2024)  Health Literacy: Adequate Health Literacy (04/08/2024)   See flowsheets for full screening details  Depression Screen PHQ 2 & 9 Depression Scale- Over the past 2 weeks, how often  have you been bothered by any of the following problems? Little interest or pleasure in doing things: 0 Feeling down, depressed, or hopeless (PHQ Adolescent also includes...irritable): 1 PHQ-2 Total Score: 1 Trouble falling or staying asleep, or sleeping too much: 0 Feeling tired or having little energy: 1 Poor appetite or overeating (PHQ Adolescent also includes...weight loss): 1 Feeling bad about yourself - or that you are a failure or have let yourself or your family down: 0 Trouble concentrating on things, such as reading the newspaper or watching television (PHQ Adolescent also includes...like school work): 0 Moving or speaking so slowly that other people could have noticed. Or the opposite - being so fidgety or restless that you have been moving around a lot more than usual: 0 Thoughts that you would be  better off dead, or of hurting yourself in some way: 0 PHQ-9 Total Score: 3 If you checked off any problems, how difficult have these problems made it for you to do your work, take care of things at home, or get along with other people?: Not difficult at all     Goals Addressed             This Visit's Progress    Patient Stated       Wants to get on an exercise program             Objective:    Today's Vitals   04/08/24 1455  Weight: 140 lb (63.5 kg)  Height: 5' 1 (1.549 m)   Body mass index is 26.45 kg/m.  Hearing/Vision screen Hearing Screening - Comments:: Some issues, has appointment scheduled with ENT Vision Screening - Comments:: Glasses, [facility], up to date Immunizations and Health Maintenance Health Maintenance  Topic Date Due   COVID-19 Vaccine (6 - 2025-26 season) 11/27/2023   Mammogram  03/13/2024   DTaP/Tdap/Td (2 - Tdap) 08/07/2024 (Originally 09/10/2021)   Colonoscopy  12/12/2024   Bone Density Scan  03/13/2025   Medicare Annual Wellness (AWV)  04/08/2025   Pneumococcal Vaccine: 50+ Years  Completed   Influenza Vaccine  Completed   Hepatitis C Screening  Completed   Zoster Vaccines- Shingrix  Completed   Meningococcal B Vaccine  Aged Out   Hepatitis B Vaccines 19-59 Average Risk  Discontinued        Assessment/Plan:  This is a routine wellness examination for Jessica Peck.  Patient Care Team: Glendia Shad, MD as PCP - General (Internal Medicine) Darliss Rogue, MD as PCP - Cardiology (Cardiology) Pyrtle, Gordy HERO, MD as Consulting Physician (Gastroenterology) Ortho, [facility] (Orthopedic Surgery)  I have personally reviewed and noted the following in the patients chart:   Medical and social history Use of alcohol, tobacco or illicit drugs  Current medications and supplements including opioid prescriptions. Functional ability and status Nutritional status Physical activity Advanced directives List of other physicians Hospitalizations,  surgeries, and ER visits in previous 12 months Vitals Screenings to include cognitive, depression, and falls Referrals and appointments  No orders of the defined types were placed in this encounter.  In addition, I have reviewed and discussed with patient certain preventive protocols, quality metrics, and best practice recommendations. A written personalized care plan for preventive services as well as general preventive health recommendations were provided to patient.   Angeline Fredericks, LPN   8/87/7973   Return in 1 year (on 04/08/2025).  After Visit Summary: (MyChart) Due to this being a telephonic visit, the after visit summary with patients personalized plan was offered to patient via  MyChart   Nurse Notes: Patient declines covid vaccine. Patient stated that she will call and schedule her mammogram that was ordered 02/29/24.  "

## 2024-04-13 ENCOUNTER — Other Ambulatory Visit: Payer: Self-pay | Admitting: Family

## 2024-04-17 ENCOUNTER — Other Ambulatory Visit (INDEPENDENT_AMBULATORY_CARE_PROVIDER_SITE_OTHER)

## 2024-04-17 DIAGNOSIS — R739 Hyperglycemia, unspecified: Secondary | ICD-10-CM

## 2024-04-17 DIAGNOSIS — E7800 Pure hypercholesterolemia, unspecified: Secondary | ICD-10-CM | POA: Diagnosis not present

## 2024-04-17 LAB — LIPID PANEL
Cholesterol: 210 mg/dL — ABNORMAL HIGH (ref 28–200)
HDL: 58.3 mg/dL
LDL Cholesterol: 120 mg/dL — ABNORMAL HIGH (ref 10–99)
NonHDL: 151.33
Total CHOL/HDL Ratio: 4
Triglycerides: 158 mg/dL — ABNORMAL HIGH (ref 10.0–149.0)
VLDL: 31.6 mg/dL (ref 0.0–40.0)

## 2024-04-17 LAB — BASIC METABOLIC PANEL WITH GFR
BUN: 17 mg/dL (ref 6–23)
CO2: 26 meq/L (ref 19–32)
Calcium: 9.5 mg/dL (ref 8.4–10.5)
Chloride: 106 meq/L (ref 96–112)
Creatinine, Ser: 0.8 mg/dL (ref 0.40–1.20)
GFR: 73.72 mL/min
Glucose, Bld: 102 mg/dL — ABNORMAL HIGH (ref 70–99)
Potassium: 4 meq/L (ref 3.5–5.1)
Sodium: 139 meq/L (ref 135–145)

## 2024-04-17 LAB — HEPATIC FUNCTION PANEL
ALT: 15 U/L (ref 3–35)
AST: 17 U/L (ref 5–37)
Albumin: 4.6 g/dL (ref 3.5–5.2)
Alkaline Phosphatase: 59 U/L (ref 39–117)
Bilirubin, Direct: 0 mg/dL — ABNORMAL LOW (ref 0.1–0.3)
Total Bilirubin: 0.3 mg/dL (ref 0.2–1.2)
Total Protein: 6.9 g/dL (ref 6.0–8.3)

## 2024-04-17 LAB — HEMOGLOBIN A1C: Hgb A1c MFr Bld: 6 % (ref 4.6–6.5)

## 2024-04-18 ENCOUNTER — Ambulatory Visit: Payer: Self-pay | Admitting: Internal Medicine

## 2024-04-19 ENCOUNTER — Ambulatory Visit: Admitting: Internal Medicine

## 2024-04-23 ENCOUNTER — Other Ambulatory Visit: Payer: Self-pay | Admitting: Internal Medicine

## 2024-04-23 DIAGNOSIS — Z1231 Encounter for screening mammogram for malignant neoplasm of breast: Secondary | ICD-10-CM

## 2024-04-24 ENCOUNTER — Ambulatory Visit: Admitting: Internal Medicine

## 2024-04-26 ENCOUNTER — Encounter: Payer: Self-pay | Admitting: Internal Medicine

## 2024-04-26 ENCOUNTER — Ambulatory Visit: Admitting: Internal Medicine

## 2024-04-26 VITALS — BP 126/72 | HR 77 | Temp 97.4°F | Ht 61.0 in | Wt 140.0 lb

## 2024-04-26 DIAGNOSIS — Z713 Dietary counseling and surveillance: Secondary | ICD-10-CM

## 2024-04-26 DIAGNOSIS — K219 Gastro-esophageal reflux disease without esophagitis: Secondary | ICD-10-CM

## 2024-04-26 DIAGNOSIS — E7800 Pure hypercholesterolemia, unspecified: Secondary | ICD-10-CM

## 2024-04-26 DIAGNOSIS — R739 Hyperglycemia, unspecified: Secondary | ICD-10-CM

## 2024-04-26 DIAGNOSIS — Z136 Encounter for screening for cardiovascular disorders: Secondary | ICD-10-CM

## 2024-04-26 DIAGNOSIS — G4733 Obstructive sleep apnea (adult) (pediatric): Secondary | ICD-10-CM

## 2024-04-26 DIAGNOSIS — Z860100 Personal history of colon polyps, unspecified: Secondary | ICD-10-CM

## 2024-04-26 DIAGNOSIS — R1310 Dysphagia, unspecified: Secondary | ICD-10-CM

## 2024-04-26 DIAGNOSIS — F32A Depression, unspecified: Secondary | ICD-10-CM

## 2024-04-26 DIAGNOSIS — R110 Nausea: Secondary | ICD-10-CM

## 2024-04-26 DIAGNOSIS — F439 Reaction to severe stress, unspecified: Secondary | ICD-10-CM

## 2024-04-26 MED ORDER — SERTRALINE HCL 50 MG PO TABS: 75.0000 mg | ORAL_TABLET | Freq: Every day | ORAL | 1 refills | Status: AC

## 2024-04-26 MED ORDER — THYROID 30 MG PO TABS: 30.0000 mg | ORAL_TABLET | Freq: Every day | ORAL | 1 refills | Status: AC

## 2024-04-26 MED ORDER — LOVASTATIN 10 MG PO TABS: 10.0000 mg | ORAL_TABLET | Freq: Every day | ORAL | 1 refills | Status: AC

## 2024-04-26 NOTE — Progress Notes (Unsigned)
 "  Subjective:    Patient ID: Jessica Peck, female    DOB: [birth year], 74 y.o.   MRN: [id number]  Patient here for No chief complaint on file.   HPI Here for a scheduled follow up. Is s/p total hip arthroplasty 12/21/22. Had f/u with ortho 03/15/23 - no acute boney abnormality. Recommended to continue activities as tolerated. F/u one year.  remains on zoloft . Using cpap. Saw GI 10/28/265 - abdominal pain, nausea, vomiting and diarrhea. Zofran  prn. MRI abd - ok. Recommended EGD - 01/25/24 - multiple gastric polyps and hiatal hernia.    Past Medical History:  Diagnosis Date   Arthritis    Chronic constipation    Depression    Esophagitis    Family history of breast cancer    Family history of colon cancer    Gastritis    EGD (hiatal hernia)   GERD (gastroesophageal reflux disease)    Hypercholesterolemia    Sleep apnea    CPAP   Vitamin D deficiency    Past Surgical History:  Procedure Laterality Date   APPENDECTOMY  03/28/1974   CHOLECYSTECTOMY     TOTAL HIP ARTHROPLASTY Right 2024   Family History  Problem Relation Age of Onset   Colon cancer Mother 93   COPD Mother    Heart disease Mother    Colon cancer Father        dx 44s   Breast cancer Sister 7       died age 12   Breast cancer Maternal Uncle        dx 42s   Breast cancer Paternal Aunt    Breast cancer Paternal Aunt    Social History   Socioeconomic History   Marital status: Married    Spouse name: Alm   Number of children: 2   Years of education: Not on file   Highest education level: Not on file  Occupational History   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  Tobacco Use   Smoking status: Former    Current packs/day: 0.00    Average packs/day: 0.1 packs/day for 1 year (0.1 ttl pk-yrs)    Types: Cigarettes    Start date: 41    Quit date: 1974    Years since quitting: 52.1   Smokeless tobacco: Never   Tobacco  comments:    Smoked socially in college  Vaping Use   Vaping status: Never Used  Substance and Sexual Activity   Alcohol use: Yes    Alcohol/week: 3.0 standard drinks of alcohol    Types: 3 Glasses of wine per week    Comment: 1 glass of wine 3 times per week   Drug use: No   Sexual activity: Not on file  Other Topics Concern   Not on file  Social History Narrative   married   Social Drivers of Health   Tobacco Use: Medium Risk (04/26/2024)   Patient History    Smoking Tobacco Use: Former    Smokeless Tobacco Use: Never    Passive Exposure: Not on file  Financial Resource Strain: Low Risk (04/08/2024)   Overall Financial Resource Strain (CARDIA)    Difficulty of Paying Living Expenses: Not hard at all  Food Insecurity: No Food Insecurity (04/08/2024)   Epic    Worried About Radiation Protection Practitioner of Food in the Last Year: Never true    Ran Out of Food in the Last Year: Never true  Transportation Needs: No Transportation Needs (04/08/2024)  Epic    Lack of Transportation (Medical): No    Lack of Transportation (Non-Medical): No  Physical Activity: Insufficiently Active (04/08/2024)   Exercise Vital Sign    Days of Exercise per Week: 1 day    Minutes of Exercise per Session: 30 min  Stress: No Stress Concern Present (04/08/2024)   [employer] of Occupational Health - Occupational Stress Questionnaire    Feeling of Stress: Only a little  Social Connections: Socially Integrated (04/08/2024)   Social Connection and Isolation Panel    Frequency of Communication with Friends and Family: More than three times a week    Frequency of Social Gatherings with Friends and Family: More than three times a week    Attends Religious Services: More than 4 times per year    Active Member of Clubs or Organizations: Yes    Attends Banker Meetings: More than 4 times per year    Marital Status: Married  Depression (PHQ2-9): Low Risk (04/08/2024)   Depression (PHQ2-9)     PHQ-2 Score: 3  Alcohol Screen: Low Risk (11/24/2022)   Alcohol Screen    Last Alcohol Screening Score (AUDIT): 3  Housing: Low Risk (04/08/2024)   Epic    Unable to Pay for Housing in the Last Year: No    Number of Times Moved in the Last Year: 0    Homeless in the Last Year: No  Utilities: Not At Risk (04/08/2024)   Epic    Threatened with loss of utilities: No  Health Literacy: Adequate Health Literacy (04/08/2024)   B1300 Health Literacy    Frequency of need for help with medical instructions: Never     Review of Systems     Objective:     BP 126/72   Pulse 77   Temp (!) 97.4 F (36.3 C) (Oral)   Ht 5' 1 (1.549 m)   Wt 140 lb (63.5 kg)   LMP 06/04/2000   SpO2 97%   BMI 26.45 kg/m  Wt Readings from Last 3 Encounters:  04/26/24 140 lb (63.5 kg)  04/08/24 140 lb (63.5 kg)  02/29/24 140 lb 9.6 oz (63.8 kg)    Physical Exam  {Perform Simple Foot Exam  Perform Detailed exam:1} {Insert foot Exam (Optional):30965}   Outpatient Encounter Medications as of 04/26/2024  Medication Sig   CELEBREX 200 MG capsule Take 200 mg by mouth daily. (Patient taking differently: Take 200 mg by mouth daily as needed.)   cyanocobalamin  (VITAMIN B12) 1000 MCG tablet Take 1,000 mcg by mouth daily.   lovastatin  (MEVACOR ) 10 MG tablet Take one tablet q Monday, Wednesday and Friday.   melatonin 5 MG TABS Take 5 mg by mouth at bedtime.   NP THYROID  30 MG tablet TAKE 1 TABLET (30 MG TOTAL) BY MOUTH DAILY BEFORE BREAKFAST.   ondansetron  (ZOFRAN -ODT) 4 MG disintegrating tablet Take 1-2 tablets (4-8 mg total) by mouth every 8 (eight) hours as needed for nausea or vomiting. Max dose of 24 mg per 24 hour period   pantoprazole  (PROTONIX ) 40 MG tablet Take 1 tablet (40 mg total) by mouth daily.   sertraline  (ZOLOFT ) 50 MG tablet Take 1.5 tablets (75 mg total) by mouth daily.   VITAMIN D, CHOLECALCIFEROL, PO Take 1,000 Units by mouth daily.   No facility-administered encounter  medications on file as of 04/26/2024.     Lab Results  Component Value Date   WBC 5.0 12/12/2023   HGB 12.3 12/12/2023   HCT 37.3 12/12/2023   PLT 317.0  12/12/2023   GLUCOSE 102 (H) 04/17/2024   CHOL 210 (H) 04/17/2024   TRIG 158.0 (H) 04/17/2024   HDL 58.30 04/17/2024   LDLDIRECT 140.0 09/21/2022   LDLCALC 120 (H) 04/17/2024   ALT 15 04/17/2024   AST 17 04/17/2024   NA 139 04/17/2024   K 4.0 04/17/2024   CL 106 04/17/2024   CREATININE 0.80 04/17/2024   BUN 17 04/17/2024   CO2 26 04/17/2024   TSH 5.21 03/29/2024   HGBA1C 6.0 04/17/2024    MR ABDOMEN MRCP W WO CONTAST Result Date: 02/13/2024 EXAM: MRCP WITH AND WITHOUT IV CONTRAST 02/12/2024 10:09:13 AM TECHNIQUE: Multisequence, multiplanar magnetic resonance images of the abdomen with and without intravenous contrast. MRCP sequences were performed. CONTRAST: 6 mL of Gadavist . COMPARISON: None available. CLINICAL HISTORY: Nausea, vomiting, and diarrhea. FINDINGS: LIVER: No focal hepatic lesion. loss of signal intensity within the liver parenchyma on a oppo. sd phase imaging consistent with mild hepatic steatosis (series 5). GALLBLADDER AND BILIARY SYSTEM: Post cholecystectomy. No intrahepatic or extrahepatic biliary ductal dilatation. The common bile duct is normal caliber. No filling defects within the common bile duct. SPLEEN: Unremarkable. PANCREAS/PANCREATIC DUCT: Visualized pancreas is unremarkable. No pancreatic ductal dilatation. ADRENAL GLANDS: Unremarkable. KIDNEYS: Unremarkable. LYMPH NODES: No enlarged abdominal lymph nodes. VASULATURE: Unremarkable. PERITONEUM: No ascites. ABDOMINAL WALL: No hernia. No mass. BOWEL: Grossly unremarkable. No bowel obstruction. BONES: No acute abnormality or worrisome osseous lesion. SOFT TISSUES: Unremarkable. MISCELLANEOUS: No explanation for knowledge of vomiting. IMPRESSION: 1. No acute abdominal or biliary abnormality to explain nausea, vomiting, and diarrhea. 2. Post cholecystectomy. 3. Mild  hepatic steatosis. Electronically signed by: Norleen Boxer MD 02/13/2024 03:32 PM EST RP Workstation: [id number]   MR 3D Recon At Scanner Result Date: 02/13/2024 EXAM: MRCP WITH AND WITHOUT IV CONTRAST 02/12/2024 10:09:13 AM TECHNIQUE: Multisequence, multiplanar magnetic resonance images of the abdomen with and without intravenous contrast. MRCP sequences were performed. CONTRAST: 6 mL of Gadavist . COMPARISON: None available. CLINICAL HISTORY: Nausea, vomiting, and diarrhea. FINDINGS: LIVER: No focal hepatic lesion. loss of signal intensity within the liver parenchyma on a oppo. sd phase imaging consistent with mild hepatic steatosis (series 5). GALLBLADDER AND BILIARY SYSTEM: Post cholecystectomy. No intrahepatic or extrahepatic biliary ductal dilatation. The common bile duct is normal caliber. No filling defects within the common bile duct. SPLEEN: Unremarkable. PANCREAS/PANCREATIC DUCT: Visualized pancreas is unremarkable. No pancreatic ductal dilatation. ADRENAL GLANDS: Unremarkable. KIDNEYS: Unremarkable. LYMPH NODES: No enlarged abdominal lymph nodes. VASULATURE: Unremarkable. PERITONEUM: No ascites. ABDOMINAL WALL: No hernia. No mass. BOWEL: Grossly unremarkable. No bowel obstruction. BONES: No acute abnormality or worrisome osseous lesion. SOFT TISSUES: Unremarkable. MISCELLANEOUS: No explanation for knowledge of vomiting. IMPRESSION: 1. No acute abdominal or biliary abnormality to explain nausea, vomiting, and diarrhea. 2. Post cholecystectomy. 3. Mild hepatic steatosis. Electronically signed by: Norleen Boxer MD 02/13/2024 03:32 PM EST RP Workstation: [id number]       Assessment & Plan:  Pure hypercholesterolemia  Hyperglycemia     Allena Hamilton, MD "

## 2024-04-29 ENCOUNTER — Encounter: Payer: Self-pay | Admitting: Internal Medicine

## 2024-04-29 DIAGNOSIS — Z136 Encounter for screening for cardiovascular disorders: Secondary | ICD-10-CM | POA: Insufficient documentation

## 2024-04-29 NOTE — Assessment & Plan Note (Signed)
 Has seen pulmonary. Has f/u with pulmonary in 2 weeks. Using cpap regularly. Discussed GLP 1 agonist. Review to see if qualifies - if severe sleep apnea. Follow. Continue cpap.

## 2024-04-29 NOTE — Assessment & Plan Note (Signed)
 Saw GI 10/28/265 - abdominal pain, nausea, vomiting and diarrhea. Zofran  prn. MRI abd - ok. Recommended EGD - 01/25/24 - multiple gastric polyps and hiatal hernia. Overall stable.

## 2024-04-29 NOTE — Assessment & Plan Note (Signed)
 Continue protonix 

## 2024-04-29 NOTE — Assessment & Plan Note (Signed)
 Discussed cholesterol. Discussed CT calcium  score.

## 2024-04-29 NOTE — Assessment & Plan Note (Signed)
 Continues on zoloft . Overall appears to be doing well. Follow.

## 2024-04-29 NOTE — Assessment & Plan Note (Signed)
Colonoscopy 12/13/19 - diverticulosis and internal hemorrhoids.  

## 2024-04-29 NOTE — Assessment & Plan Note (Signed)
 Continues on zoloft . Appears to be handling things relatively well. Follow.

## 2024-05-06 ENCOUNTER — Other Ambulatory Visit

## 2024-05-08 ENCOUNTER — Ambulatory Visit: Admitting: Sleep Medicine

## 2024-05-14 ENCOUNTER — Encounter

## 2024-08-09 ENCOUNTER — Other Ambulatory Visit

## 2024-08-13 ENCOUNTER — Ambulatory Visit: Admitting: Internal Medicine

## 2025-04-09 ENCOUNTER — Ambulatory Visit
